# Patient Record
Sex: Female | Born: 1990
Health system: Southern US, Community
[De-identification: ages and names within clinical notes are randomized; demographics above are authoritative.]

## PROBLEM LIST (undated history)

## (undated) ENCOUNTER — Inpatient Hospital Stay (HOSPITAL_COMMUNITY): Payer: Medicaid Other

## (undated) DIAGNOSIS — F419 Anxiety disorder, unspecified: Secondary | ICD-10-CM

## (undated) DIAGNOSIS — N926 Irregular menstruation, unspecified: Secondary | ICD-10-CM

## (undated) DIAGNOSIS — Z309 Encounter for contraceptive management, unspecified: Secondary | ICD-10-CM

## (undated) DIAGNOSIS — F32A Depression, unspecified: Secondary | ICD-10-CM

## (undated) DIAGNOSIS — Z789 Other specified health status: Secondary | ICD-10-CM

## (undated) HISTORY — DX: Irregular menstruation, unspecified: N92.6

## (undated) HISTORY — PX: NO PAST SURGERIES: SHX2092

## (undated) HISTORY — DX: Encounter for contraceptive management, unspecified: Z30.9

---
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
## 2011-10-24 LAB — OB RESULTS CONSOLE HEPATITIS B SURFACE ANTIGEN: Hepatitis B Surface Ag: NEGATIVE

## 2011-10-24 LAB — OB RESULTS CONSOLE RPR: RPR: NONREACTIVE

## 2011-10-24 LAB — OB RESULTS CONSOLE GC/CHLAMYDIA: Chlamydia: NEGATIVE

## 2012-05-18 LAB — OB RESULTS CONSOLE GBS: GBS: NEGATIVE

## 2012-05-26 NOTE — L&D Delivery Note (Signed)
Operative Delivery Note At 7:10 PM a viable female was delivered via Vaginal, Vacuum Investment banker, operational).  Presentation: vertex; Position: Left,; Station: +2.  Verbal consent: obtained from patient.  Risks and benefits discussed in detail.  Risks include, but are not limited to the risks of anesthesia, bleeding, infection, damage to maternal tissues, fetal cephalhematoma.  There is also the risk of inability to effect vaginal delivery of the head, or shoulder dystocia that cannot be resolved by established maneuvers, leading to the need for emergency cesarean section.  APGAR: 6, 8; weight .   Placenta status: , .   Cord: 3 vessels with the following complications: .  Cord pH: 7.14  Anesthesia: Epidural  Instruments: Kiwi vacuum Episiotomy: None Lacerations: left labial and vaginal sulcus repaired.  Right periurethral hemostatic Suture Repair: 3.0 vicryl rapide Est. Blood Loss (mL): 400  Mom to postpartum.  Baby to nursery-stable.  Kimi Bordeau H. 06/12/2012, 7:47 PM

## 2012-06-06 ENCOUNTER — Inpatient Hospital Stay (HOSPITAL_COMMUNITY)
Admission: AD | Admit: 2012-06-06 | Discharge: 2012-06-07 | Disposition: A | Discharge status: Home / Self Care | Payer: Medicaid Other | Source: Ambulatory Visit | Attending: Obstetrics & Gynecology | Admitting: Obstetrics & Gynecology

## 2012-06-06 ENCOUNTER — Encounter (HOSPITAL_COMMUNITY): Payer: Self-pay | Admitting: *Deleted

## 2012-06-06 DIAGNOSIS — M549 Dorsalgia, unspecified: Secondary | ICD-10-CM | POA: Insufficient documentation

## 2012-06-06 DIAGNOSIS — N949 Unspecified condition associated with female genital organs and menstrual cycle: Secondary | ICD-10-CM | POA: Insufficient documentation

## 2012-06-06 DIAGNOSIS — O99891 Other specified diseases and conditions complicating pregnancy: Secondary | ICD-10-CM | POA: Insufficient documentation

## 2012-06-06 NOTE — MAU Note (Signed)
Pt reports having back pain and pelvic pain. Tightening. ?leaking fluid x 1 hour

## 2012-06-07 LAB — AMNISURE RUPTURE OF MEMBRANE (ROM) NOT AT ARMC: Amnisure ROM: NEGATIVE

## 2012-06-07 MED ORDER — ZOLPIDEM TARTRATE 5 MG PO TABS
5.0000 mg | ORAL_TABLET | Freq: Once | ORAL | Status: AC
  Administered 2012-06-07: 5 mg via ORAL
  Filled 2012-06-07: qty 1

## 2012-06-07 NOTE — MAU Note (Signed)
Pt's mom concerned about not getting back in time or not knowing when to return. Discussed s/s's of labor, things to return right away for. Average length of first labor, can call office in am if needed or return here as needed.

## 2012-06-11 ENCOUNTER — Inpatient Hospital Stay (HOSPITAL_COMMUNITY)
Admission: AD | Admit: 2012-06-11 | Discharge: 2012-06-11 | Disposition: A | Discharge status: Home / Self Care | Payer: Medicaid Other | Source: Ambulatory Visit | Attending: Obstetrics and Gynecology | Admitting: Obstetrics and Gynecology

## 2012-06-11 ENCOUNTER — Encounter (HOSPITAL_COMMUNITY): Payer: Self-pay | Admitting: *Deleted

## 2012-06-11 ENCOUNTER — Inpatient Hospital Stay (HOSPITAL_COMMUNITY)
Admission: AD | Admit: 2012-06-11 | Discharge: 2012-06-14 | DRG: 775 | Disposition: A | Discharge status: Home / Self Care | Payer: Medicaid Other | Source: Ambulatory Visit | Attending: Obstetrics & Gynecology | Admitting: Obstetrics & Gynecology

## 2012-06-11 DIAGNOSIS — R339 Retention of urine, unspecified: Secondary | ICD-10-CM | POA: Diagnosis not present

## 2012-06-11 DIAGNOSIS — O479 False labor, unspecified: Secondary | ICD-10-CM | POA: Insufficient documentation

## 2012-06-11 DIAGNOSIS — O368190 Decreased fetal movements, unspecified trimester, not applicable or unspecified: Secondary | ICD-10-CM | POA: Diagnosis present

## 2012-06-11 DIAGNOSIS — O480 Post-term pregnancy: Secondary | ICD-10-CM | POA: Diagnosis present

## 2012-06-11 MED ORDER — ZOLPIDEM TARTRATE 5 MG PO TABS
5.0000 mg | ORAL_TABLET | Freq: Once | ORAL | Status: AC
  Administered 2012-06-11: 5 mg via ORAL
  Filled 2012-06-11: qty 1

## 2012-06-11 NOTE — MAU Note (Signed)
Contractions, back pain

## 2012-06-11 NOTE — MAU Provider Note (Signed)
  History     CSN: [id number]  Arrival date and time: 06/11/12 2257   None     Chief Complaint  Patient presents with  . Labor Eval   HPI Comments: 22 y.o. G1 at 40.1 weeks followed at [facility] for her [facility] presents with contractions.  She was seen ~20 hours ago for the same.  At that time she was 3cm, she is currently 3.5cm.  She reports contractions every 1-2 minutes, have not slowed down since being sent home this AM.  Decreased FM for the past few hours. No vaginal bleeding or LOF.   Uncomplicated prenatal course, GBS negative    OB History    Grav Para Term Preterm Abortions TAB SAB Ect Mult Living   1               Past Medical History  Diagnosis Date  . No pertinent past medical history     Past Surgical History  Procedure Date  . No past surgeries     History reviewed. No pertinent family history.  History  Substance Use Topics  . Smoking status: Current Every Day Smoker -- 0.5 packs/day  . Smokeless tobacco: Never Used  . Alcohol Use: No    Allergies: No Known Allergies  Prescriptions prior to admission  Medication Sig Dispense Refill  . prenatal vitamin w/FE, FA (PRENATAL 1 + 1) 27-1 MG TABS Take 1 tablet by mouth daily.        ROS Physical Exam   Blood pressure 120/82, pulse 102, temperature 98.4 F (36.9 C), temperature source Oral, resp. rate 20, height 5' (1.524 m), weight 76.295 kg (168 lb 3.2 oz).  Physical Exam  Constitutional: She appears well-developed and well-nourished. No distress.  GI:       gravid   Dilation: 3.5 Effacement (%): 70 Cervical Position: Middle Station: -2 Presentation: Vertex Exam by:: L.. Munford RN   MAU Course  Procedures  MDM Minimal cervical change but in a post-dates pt. D/w Dr. Emelda Fear who is in favor of admitting patient to L&D  Assessment and Plan  22 yo G1 in early/prodromal labor -Admit to L&D  [facility] 06/11/2012, 11:27 PM

## 2012-06-11 NOTE — MAU Note (Signed)
Contractions about a minute apart. No vaginal bleeding or leaking of fluid.

## 2012-06-11 NOTE — H&P (Signed)
Gina Alvarez is a G16P0 22 y.o. female @ 26.1 by 1st trimester U/S followed at [facility] presenting for contractions.  Was evaluated early in the morning yesterday for contractions but was sent home with no cervical change.  Contractions have not improved since doing home, every 1-2 minutes. Decreased FM this evening. No LOF or vaginal bleeding.  Maternal Medical History:  Reason for admission: Reason for admission: contractions.  Reason for Admission:   nauseaContractions: Onset was yesterday.   Frequency: regular.   Perceived severity is moderate.    Fetal activity: Perceived fetal activity is decreased.   Last perceived fetal movement was within the past hour.    Prenatal complications: No bleeding, hypertension, infection, pre-eclampsia, preterm labor or substance abuse.   Prenatal Complications - Diabetes: none.    OB History    Grav Para Term Preterm Abortions TAB SAB Ect Mult Living   1              Past Medical History  Diagnosis Date  . No pertinent past medical history    Past Surgical History  Procedure Date  . No past surgeries    Family History: family history is not on file. Social History:  reports that she has been smoking.  She has never used smokeless tobacco. She reports that she does not drink alcohol or use illicit drugs.   Prenatal Transfer Tool  Maternal Diabetes: No Genetic Screening: Normal Maternal Ultrasounds/Referrals: Normal Fetal Ultrasounds or other Referrals:  None Maternal Substance Abuse:  No Significant Maternal Medications:  None Significant Maternal Lab Results:  Lab values include: Group B Strep negative Other Comments:  None  Review of Systems  Constitutional: Negative for fever.  Respiratory: Negative for cough.   Gastrointestinal: Positive for abdominal pain. Negative for nausea and vomiting.  Genitourinary: Negative for dysuria.  Musculoskeletal: Negative for falls.  Neurological: Negative for dizziness.    Dilation:  3.5 Effacement (%): 70 Station: -2 Exam by:: L.. Munford RN Blood pressure 120/82, pulse 102, temperature 98.4 F (36.9 C), temperature source Oral, resp. rate 20, height 5' (1.524 m), weight 76.295 kg (168 lb 3.2 oz). Maternal Exam:  Uterine Assessment: Contraction strength is firm.  Contraction frequency is irregular.   Abdomen: Patient reports no abdominal tenderness. Cervix: Cervix evaluated by digital exam.     Fetal Exam Fetal Monitor Review: Baseline rate: 130.  Variability: moderate (6-25 bpm).   Pattern: accelerations present.    Fetal State Assessment: Category I - tracings are normal.     Physical Exam  Constitutional: She is oriented to person, place, and time. She appears well-developed and well-nourished. No distress.  HENT:  Head: Normocephalic and atraumatic.  Eyes: Conjunctivae normal are normal. Pupils are equal, round, and reactive to light.  Cardiovascular: Normal rate and regular rhythm.   No murmur heard. Respiratory: Effort normal and breath sounds normal. No respiratory distress. She has no wheezes.  GI:       gravid  Musculoskeletal: She exhibits no edema.  Neurological: She is alert and oriented to person, place, and time.  Skin: Skin is warm and dry. No rash noted.    Prenatal labs: ABO, Rh:  A+ Antibody:   Neg Rubella:   Imm RPR:   NR HBsAg:   Neg HIV:   NR GBS: Negative (12/24 0000)   Assessment/Plan: 22 yo G1P0 at 40.1 presents in early/prodromal labor -Admit to L&D -GBS neg, no need for abx at this time -Cat 1 tracing per Dr. Emelda Fear  -  Fentanyl and then epidural for pain -Will monitor for a few hours and start pitocin if needed   Gina Alvarez 06/11/2012, 11:54 PM

## 2012-06-12 ENCOUNTER — Encounter (HOSPITAL_COMMUNITY): Payer: Self-pay | Admitting: Anesthesiology

## 2012-06-12 ENCOUNTER — Inpatient Hospital Stay (HOSPITAL_COMMUNITY): Payer: Medicaid Other | Admitting: Anesthesiology

## 2012-06-12 ENCOUNTER — Encounter (HOSPITAL_COMMUNITY): Payer: Self-pay | Admitting: *Deleted

## 2012-06-12 DIAGNOSIS — O480 Post-term pregnancy: Secondary | ICD-10-CM

## 2012-06-12 DIAGNOSIS — O368190 Decreased fetal movements, unspecified trimester, not applicable or unspecified: Secondary | ICD-10-CM

## 2012-06-12 LAB — CBC
HCT: 34.5 % — ABNORMAL LOW (ref 36.0–46.0)
MCH: 32.8 pg (ref 26.0–34.0)
MCV: 92.7 fL (ref 78.0–100.0)
RBC: 3.72 MIL/uL — ABNORMAL LOW (ref 3.87–5.11)
WBC: 19.4 10*3/uL — ABNORMAL HIGH (ref 4.0–10.5)

## 2012-06-12 MED ORDER — PRENATAL MULTIVITAMIN CH
1.0000 | ORAL_TABLET | Freq: Every day | ORAL | Status: DC
  Administered 2012-06-13 – 2012-06-14 (×2): 1 via ORAL
  Filled 2012-06-12 (×2): qty 1

## 2012-06-12 MED ORDER — FENTANYL 2.5 MCG/ML BUPIVACAINE 1/10 % EPIDURAL INFUSION (WH - ANES)
14.0000 mL/h | INTRAMUSCULAR | Status: DC
  Filled 2012-06-12 (×2): qty 125

## 2012-06-12 MED ORDER — LACTATED RINGERS IV SOLN
INTRAVENOUS | Status: DC
  Administered 2012-06-12 (×2): via INTRAVENOUS

## 2012-06-12 MED ORDER — TETANUS-DIPHTH-ACELL PERTUSSIS 5-2.5-18.5 LF-MCG/0.5 IM SUSP: 0.5000 mL | Freq: Once | INTRAMUSCULAR | Status: DC

## 2012-06-12 MED ORDER — CITRIC ACID-SODIUM CITRATE 334-500 MG/5ML PO SOLN
ORAL | Status: AC
  Filled 2012-06-12: qty 15

## 2012-06-12 MED ORDER — PHENYLEPHRINE 40 MCG/ML (10ML) SYRINGE FOR IV PUSH (FOR BLOOD PRESSURE SUPPORT)
80.0000 ug | PREFILLED_SYRINGE | INTRAVENOUS | Status: DC | PRN
  Filled 2012-06-12: qty 5

## 2012-06-12 MED ORDER — SENNOSIDES-DOCUSATE SODIUM 8.6-50 MG PO TABS
2.0000 | ORAL_TABLET | Freq: Every day | ORAL | Status: DC
  Administered 2012-06-12 – 2012-06-13 (×2): 2 via ORAL

## 2012-06-12 MED ORDER — WITCH HAZEL-GLYCERIN EX PADS: 1.0000 "application " | MEDICATED_PAD | CUTANEOUS | Status: DC | PRN

## 2012-06-12 MED ORDER — IBUPROFEN 600 MG PO TABS: 600.0000 mg | ORAL_TABLET | Freq: Four times a day (QID) | ORAL | Status: DC | PRN

## 2012-06-12 MED ORDER — LACTATED RINGERS IV SOLN: 500.0000 mL | INTRAVENOUS | Status: DC | PRN

## 2012-06-12 MED ORDER — OXYTOCIN BOLUS FROM INFUSION
500.0000 mL | INTRAVENOUS | Status: DC
  Administered 2012-06-12: 500 mL via INTRAVENOUS

## 2012-06-12 MED ORDER — BISACODYL 10 MG RE SUPP: 10.0000 mg | Freq: Every day | RECTAL | Status: DC | PRN

## 2012-06-12 MED ORDER — LACTATED RINGERS IV SOLN
500.0000 mL | Freq: Once | INTRAVENOUS | Status: AC
  Administered 2012-06-12: 500 mL via INTRAVENOUS

## 2012-06-12 MED ORDER — CITRIC ACID-SODIUM CITRATE 334-500 MG/5ML PO SOLN: 30.0000 mL | ORAL | Status: DC | PRN

## 2012-06-12 MED ORDER — ZOLPIDEM TARTRATE 5 MG PO TABS: 5.0000 mg | ORAL_TABLET | Freq: Every evening | ORAL | Status: DC | PRN

## 2012-06-12 MED ORDER — ACETAMINOPHEN 325 MG PO TABS
650.0000 mg | ORAL_TABLET | ORAL | Status: DC | PRN
  Administered 2012-06-12: 650 mg via ORAL
  Filled 2012-06-12: qty 2

## 2012-06-12 MED ORDER — PHENYLEPHRINE 40 MCG/ML (10ML) SYRINGE FOR IV PUSH (FOR BLOOD PRESSURE SUPPORT): 80.0000 ug | PREFILLED_SYRINGE | INTRAVENOUS | Status: DC | PRN

## 2012-06-12 MED ORDER — EPHEDRINE 5 MG/ML INJ
10.0000 mg | INTRAVENOUS | Status: DC | PRN
  Filled 2012-06-12: qty 4

## 2012-06-12 MED ORDER — ONDANSETRON HCL 4 MG/2ML IJ SOLN: 4.0000 mg | INTRAMUSCULAR | Status: DC | PRN

## 2012-06-12 MED ORDER — MEASLES, MUMPS & RUBELLA VAC ~~LOC~~ INJ
0.5000 mL | INJECTION | Freq: Once | SUBCUTANEOUS | Status: DC
  Filled 2012-06-12: qty 0.5

## 2012-06-12 MED ORDER — OXYTOCIN 40 UNITS IN LACTATED RINGERS INFUSION - SIMPLE MED: 62.5000 mL/h | INTRAVENOUS | Status: DC

## 2012-06-12 MED ORDER — OXYTOCIN 40 UNITS IN LACTATED RINGERS INFUSION - SIMPLE MED: 62.5000 mL/h | INTRAVENOUS | Status: DC | PRN

## 2012-06-12 MED ORDER — LIDOCAINE HCL (PF) 1 % IJ SOLN: 30.0000 mL | INTRAMUSCULAR | Status: DC | PRN

## 2012-06-12 MED ORDER — ONDANSETRON HCL 4 MG/2ML IJ SOLN: 4.0000 mg | Freq: Four times a day (QID) | INTRAMUSCULAR | Status: DC | PRN

## 2012-06-12 MED ORDER — FENTANYL 2.5 MCG/ML BUPIVACAINE 1/10 % EPIDURAL INFUSION (WH - ANES)
INTRAMUSCULAR | Status: DC | PRN
  Administered 2012-06-12: 14 mL/h via EPIDURAL

## 2012-06-12 MED ORDER — SIMETHICONE 80 MG PO CHEW: 80.0000 mg | CHEWABLE_TABLET | ORAL | Status: DC | PRN

## 2012-06-12 MED ORDER — SODIUM CHLORIDE 0.9 % IJ SOLN: 3.0000 mL | Freq: Two times a day (BID) | INTRAMUSCULAR | Status: DC

## 2012-06-12 MED ORDER — ONDANSETRON HCL 4 MG PO TABS: 4.0000 mg | ORAL_TABLET | ORAL | Status: DC | PRN

## 2012-06-12 MED ORDER — SODIUM BICARBONATE 8.4 % IV SOLN
INTRAVENOUS | Status: DC | PRN
  Administered 2012-06-12: 5 mL via EPIDURAL

## 2012-06-12 MED ORDER — IBUPROFEN 600 MG PO TABS
600.0000 mg | ORAL_TABLET | Freq: Four times a day (QID) | ORAL | Status: DC
  Administered 2012-06-12 – 2012-06-14 (×7): 600 mg via ORAL
  Filled 2012-06-12 (×7): qty 1

## 2012-06-12 MED ORDER — DIBUCAINE 1 % RE OINT: 1.0000 "application " | TOPICAL_OINTMENT | RECTAL | Status: DC | PRN

## 2012-06-12 MED ORDER — BENZOCAINE-MENTHOL 20-0.5 % EX AERO
1.0000 "application " | INHALATION_SPRAY | CUTANEOUS | Status: DC | PRN
  Administered 2012-06-12: 1 via TOPICAL
  Filled 2012-06-12: qty 56

## 2012-06-12 MED ORDER — FENTANYL CITRATE 0.05 MG/ML IJ SOLN
50.0000 ug | INTRAMUSCULAR | Status: DC | PRN
  Administered 2012-06-12 (×2): 50 ug via INTRAVENOUS
  Filled 2012-06-12 (×2): qty 2

## 2012-06-12 MED ORDER — OXYCODONE-ACETAMINOPHEN 5-325 MG PO TABS
1.0000 | ORAL_TABLET | ORAL | Status: DC | PRN
  Administered 2012-06-13 – 2012-06-14 (×4): 1 via ORAL
  Filled 2012-06-12 (×4): qty 1

## 2012-06-12 MED ORDER — FLEET ENEMA 7-19 GM/118ML RE ENEM: 1.0000 | ENEMA | Freq: Every day | RECTAL | Status: DC | PRN

## 2012-06-12 MED ORDER — SODIUM CHLORIDE 0.9 % IV SOLN: 250.0000 mL | INTRAVENOUS | Status: DC | PRN

## 2012-06-12 MED ORDER — DIPHENHYDRAMINE HCL 25 MG PO CAPS: 25.0000 mg | ORAL_CAPSULE | Freq: Four times a day (QID) | ORAL | Status: DC | PRN

## 2012-06-12 MED ORDER — TERBUTALINE SULFATE 1 MG/ML IJ SOLN: 0.2500 mg | Freq: Once | INTRAMUSCULAR | Status: DC | PRN

## 2012-06-12 MED ORDER — OXYTOCIN 40 UNITS IN LACTATED RINGERS INFUSION - SIMPLE MED
1.0000 m[IU]/min | INTRAVENOUS | Status: DC
  Administered 2012-06-12: 2 m[IU]/min via INTRAVENOUS
  Filled 2012-06-12: qty 1000

## 2012-06-12 MED ORDER — LANOLIN HYDROUS EX OINT: TOPICAL_OINTMENT | CUTANEOUS | Status: DC | PRN

## 2012-06-12 MED ORDER — DIPHENHYDRAMINE HCL 50 MG/ML IJ SOLN: 12.5000 mg | INTRAMUSCULAR | Status: DC | PRN

## 2012-06-12 MED ORDER — EPHEDRINE 5 MG/ML INJ: 10.0000 mg | INTRAVENOUS | Status: DC | PRN

## 2012-06-12 MED ORDER — FLEET ENEMA 7-19 GM/118ML RE ENEM: 1.0000 | ENEMA | RECTAL | Status: DC | PRN

## 2012-06-12 MED ORDER — SODIUM CHLORIDE 0.9 % IJ SOLN: 3.0000 mL | INTRAMUSCULAR | Status: DC | PRN

## 2012-06-12 MED ORDER — OXYCODONE-ACETAMINOPHEN 5-325 MG PO TABS: 1.0000 | ORAL_TABLET | ORAL | Status: DC | PRN

## 2012-06-12 NOTE — Progress Notes (Signed)
Patient requesting additional pain med,  Says she "can feel contractions now and she can't sleep". Patient showing any sign of pain during contractio, able to talk and walk during contraction without any sign of distress. During palpation for 10 minutes felt 2 contractions, able to easily indent uterus during contractions. Encouraged patient to wait for medication and cannot be repeated indefinitely and some pain during labor is to be expected. Patient agreed to wait until exam at 4AM and will revisit pain medication at that time. Candise Che, RN

## 2012-06-12 NOTE — Progress Notes (Signed)
Gina Alvarez is a 22 y.o. G1P0 at [redacted]w[redacted]d admitted for SOL early phase  Subjective: Able to tell when having uc to push Family supportive at bs  Objective: BP 90/55  Pulse 85  Temp 99.5 F (37.5 C) (Axillary)  Resp 18  Ht 5' (1.524 m)  Wt 76.295 kg (168 lb 3.2 oz)  BMI 32.85 kg/m2  SpO2 100%      FHT:  Baseline indeterminate, 145-165 between uc's/pushing, mostly minimal variability w/  Brief periods of moderate, no accels, variables & lates w/ pushing Dr. leggett recently in to view progress and fhr UC:   1.5-4 SVE:   Dilation: 10 Effacement (%): 100 Station: +2 Exam by:: lee  Pushing in lithotomy x 1hr w/ some descent of head per rn Maternal position change to Lt lateral for pushing  O2 via nrbm  Labs: Lab Results  Component Value Date   WBC 19.4* 06/12/2012   HGB 12.2 06/12/2012   HCT 34.5* 06/12/2012   MCV 92.7 06/12/2012   PLT 163 06/12/2012    Assessment / Plan: Augmentation of labor, progressing well  Labor: Progressing normally Preeclampsia:  n/a Fetal Wellbeing:  Category II Pain Control:  Epidural I/D:  n/a Anticipated MOD:  NSVD  Marge Duncans 06/12/2012, 6:32 PM

## 2012-06-12 NOTE — Progress Notes (Signed)
Gina Alvarez is a 22 y.o. G1P0 at [redacted]w[redacted]d admitted for SOL- early phase  Subjective: Comfortable w/ epidural, feeling a little pelvic pressure. Family supportive at bs.  Objective: BP 97/53  Pulse 74  Temp 98.5 F (36.9 C) (Axillary)  Resp 16  Ht 5' (1.524 m)  Wt 76.295 kg (168 lb 3.2 oz)  BMI 32.85 kg/m2  SpO2 100%      FHT:  FHR: 130 bpm, variability: moderate,  accelerations:  Present,  decelerations:  Present variable and late when on back for exam  Turned to Rt exaggerated sims and lates continued, LR bolus and O2 via nrbm given and turned to Lt exaggerated sims with resolution of decels UC:   irregular, every 1-4 minutes SVE:   6/90/-1, AROM small amount blood-tinged fluid  Moderate amount thick dark-red blood w/ small clots on exam and gown/pad   Labs: Lab Results  Component Value Date   WBC 19.4* 06/12/2012   HGB 12.2 06/12/2012   HCT 34.5* 06/12/2012   MCV 92.7 06/12/2012   PLT 163 06/12/2012    Assessment / Plan: Augmentation of labor, protracted active phase, pitocin @ 64mu/min, presumed partial marginal abruption  Labor: protracted active phase Preeclampsia:  n/a Fetal Wellbeing:  Category II Pain Control:  Epidural I/D:  n/a Anticipated MOD:  NSVD  Marge Duncans 06/12/2012, 1:29 PM

## 2012-06-12 NOTE — Progress Notes (Signed)
Gina Alvarez is a 22 y.o. G1P0 at [redacted]w[redacted]d admitted for early labor, postdates  Subjective: Thinks she wants an epidural, having a lot of back pain/labor  Objective: BP 108/63  Pulse 78  Temp 98.7 F (37.1 C) (Axillary)  Resp 18  Ht 5' (1.524 m)  Wt 76.295 kg (168 lb 3.2 oz)  BMI 32.85 kg/m2      FHT:  FHR: 130 bpm, variability: moderate,  accelerations:  Present,  decelerations:  Absent UC:   regular, every 2-3 minutes SVE:   Dilation: 5 Effacement (%): 100 Station: -1 Exam by:: lee  Labs: Lab Results  Component Value Date   WBC 19.4* 06/12/2012   HGB 12.2 06/12/2012   HCT 34.5* 06/12/2012   MCV 92.7 06/12/2012   PLT 163 06/12/2012    Assessment / Plan: Early labor, on pitocin  Labor: Progressing on Pitocin, will continue to increase then AROM Preeclampsia:  n/a Fetal Wellbeing:  Category I Pain Control:  Fentanyl and then epidural I/D:  n/a Anticipated MOD:  NSVD  Ayra Hodgdon 06/12/2012, 7:33 AM

## 2012-06-12 NOTE — Progress Notes (Addendum)
Gina Alvarez is a 22 y.o. G1P0 at [redacted]w[redacted]d admitted for SOL, early phase  Subjective: Mostly comfortable w/ epidural, feeling more pelvic pressure  Objective: BP 106/57  Pulse 87  Temp 98.4 F (36.9 C) (Axillary)  Resp 16  Ht 5' (1.524 m)  Wt 76.295 kg (168 lb 3.2 oz)  BMI 32.85 kg/m2  SpO2 100%      FHT:  FHR: 140 bpm, variability: moderate,  accelerations:  Present,  decelerations:  Present rare late, some earlies UC:   q 1-3.5 SVE:   9/100/+1 w/ pos scalp stim IUPC not placed d/t advanced dilation  Labs: Lab Results  Component Value Date   WBC 19.4* 06/12/2012   HGB 12.2 06/12/2012   HCT 34.5* 06/12/2012   MCV 92.7 06/12/2012   PLT 163 06/12/2012    Assessment / Plan: Augmentation of labor, progressing well, pitocin currently @ 6mu/min  Labor: Progressing normally Preeclampsia:  n/a Fetal Wellbeing:  Category II Pain Control:  Epidural I/D:  n/a Anticipated MOD:  NSVD  Discussed w/ Dr. Penne Lash prior to exam Gina Alvarez 06/12/2012, 3:39 PM

## 2012-06-12 NOTE — MAU Provider Note (Signed)
Attestation of Attending Supervision of Advanced Practitioner: Evaluation and management procedures were performed by the PA/NP/CNM/OB Fellow under my supervision/collaboration. Chart reviewed and agree with management and plan.  Jacquelynne Guedes V 06/12/2012 2:10 AM

## 2012-06-12 NOTE — Progress Notes (Signed)
Gina Alvarez is a 22 y.o. G1P0 at [redacted]w[redacted]d admitted for SOL, early phase  Subjective: Comfortable w/ epidural, no complaints.   Objective: BP 113/69  Pulse 87  Temp 98 F (36.7 C) (Axillary)  Resp 16  Ht 5' (1.524 m)  Wt 76.295 kg (168 lb 3.2 oz)  BMI 32.85 kg/m2  SpO2 100%      FHT:  FHR: 130 bpm, variability: min-mod,  accelerations:  Present,  decelerations:  Present late x 1 Maternal position change to Lt exaggerated sims from Rt, with improvement in variability, and no further lates UC:   regular, every 1-3 minutes SVE:   Dilation: 6 Effacement (%): 100 Station: 0 Exam by:: lee @ 1011 Slightly increased amount of bloody show  Labs: Lab Results  Component Value Date   WBC 19.4* 06/12/2012   HGB 12.2 06/12/2012   HCT 34.5* 06/12/2012   MCV 92.7 06/12/2012   PLT 163 06/12/2012    Assessment / Plan: Augmentation of labor, progressing well, pitocin currently @ 37mu/min  Labor: Progressing normally Preeclampsia:  n/a Fetal Wellbeing:  Category II Pain Control:  Epidural I/D:  n/a Anticipated MOD:  NSVD  Marge Duncans 06/12/2012, 11:30 AM

## 2012-06-12 NOTE — H&P (Signed)
Attestation of Attending Supervision of Advanced Practitioner: Evaluation and management procedures were performed by the PA/NP/CNM/OB Fellow under my supervision/collaboration. Chart reviewed and agree with management and plan.  Zaidee Rion V 06/12/2012 2:10 AM    

## 2012-06-12 NOTE — Anesthesia Procedure Notes (Signed)

## 2012-06-12 NOTE — Anesthesia Preprocedure Evaluation (Signed)

## 2012-06-13 NOTE — Addendum Note (Signed)
Addendum  created 06/13/12 0749 by Lincoln Brigham, CRNA   Modules edited:Charges VN, Notes Section

## 2012-06-13 NOTE — Addendum Note (Signed)
Addendum  created 06/13/12 0746 by Jiles Garter, MD   Modules edited:Notes Section

## 2012-06-13 NOTE — Anesthesia Postprocedure Evaluation (Signed)
  Anesthesia Post-op Note This patient has recovered from her labor epidural, and I am not aware of any complications or problems.  

## 2012-06-13 NOTE — Anesthesia Postprocedure Evaluation (Signed)
Anesthesia Post Note  Patient: Gina Alvarez  Procedure(s) Performed: * No procedures listed *  Anesthesia type: Epidural  Patient location: Mother/Baby  Post pain: Pain level controlled  Post assessment: Post-op Vital signs reviewed  Last Vitals:  Filed Vitals:   06/13/12 0526  BP: 104/66  Pulse: 88  Temp: 36.7 C  Resp: 20    Post vital signs: Reviewed  Level of consciousness:alert  Complications: No apparent anesthesia complications

## 2012-06-13 NOTE — Progress Notes (Signed)
Post Partum Day 1 Subjective: no complaints, up ad lib, tolerating PO and + flatus Objective: Blood pressure 104/66, pulse 88, temperature 98.1 F (36.7 C), temperature source Oral, resp. rate 20, height 5' (1.524 m), weight 76.295 kg (168 lb 3.2 oz), SpO2 100.00%, unknown if currently breastfeeding. Unable to urinate postpartum, RN did I&O cath and got , so placed foley  Physical Exam:  General: alert, cooperative and no distress Lochia: appropriate Uterine Fundus: firm Incision: n/a DVT Evaluation: No evidence of DVT seen on physical exam. Negative Homan's sign. No cords or calf tenderness. No significant calf/ankle edema.   Basename 06/12/12 0055  HGB 12.2  HCT 34.5*    Assessment/Plan: Plan for discharge tomorrow, Breastfeeding, Lactation consult and Contraception nexplanon   LOS: 2 days   Marge Duncans 06/13/2012, 7:22 AM

## 2012-06-14 DIAGNOSIS — R339 Retention of urine, unspecified: Secondary | ICD-10-CM

## 2012-06-14 HISTORY — DX: Retention of urine, unspecified: R33.9

## 2012-06-14 MED ORDER — OXYCODONE-ACETAMINOPHEN 5-325 MG PO TABS: 1.0000 | ORAL_TABLET | ORAL | Status: DC | PRN

## 2012-06-14 MED ORDER — IBUPROFEN 600 MG PO TABS: 600.0000 mg | ORAL_TABLET | Freq: Four times a day (QID) | ORAL | Status: DC

## 2012-06-14 NOTE — Discharge Summary (Signed)
Attestation of Attending Supervision of Advanced Practitioner: Evaluation and management procedures were performed by the PA/NP/CNM/OB Fellow under my supervision/collaboration. Chart reviewed and agree with management and plan., and documentation of care.  Evalette Montrose V 06/14/2012 10:43 PM

## 2012-06-14 NOTE — Progress Notes (Signed)
Foley catheter was removed at 0715.  Pt voided 75ml at 0845 and at 1100 with an unmeasured void immediately following that void.  Spoke to Dr. Paulina Fusi who said to do a bladder scan post void, if bladder measured < pt could be D/C'd.  Pt then voided and had a post void bladder scan.  Informed pt to call physician if she is unable to void in 6 hours while at home, pt acknowledged instructions.

## 2012-06-14 NOTE — Progress Notes (Signed)
UR chart review completed.  

## 2012-06-14 NOTE — Discharge Summary (Signed)
Obstetric Discharge Summary Reason for Admission: onset of labor Prenatal Procedures: NST Intrapartum Procedures: vacuum Postpartum Procedures: none Complications-Operative and Postpartum: second degree perineal laceration and sulcus laceration.  Had perineal edema and was unable to void. Foley placed. It was removed this morning. . Hemoglobin  Date Value Range Status  06/12/2012 12.2  12.0 - 15.0 g/dL Final     HCT  Date Value Range Status  06/12/2012 34.5* 36.0 - 46.0 % Final   Hospital Course: G8P0 22 y.o. female @ 62.1 by 1st trimester U/S followed at [facility] presenting for contractions. Was evaluated early in the morning yesterday for contractions but was sent home with no cervical change. Contractions have not improved since doing home, every 1-2 minutes. Decreased FM this evening. No LOF or vaginal bleeding.   Labor progressed well but patient pushed for an hour or so with descent to +2 station.  Operative Delivery Note (per Dr Penne Lash, amended) At 7:10 PM a viable female was delivered via Vaginal, Vacuum (Extractor). Presentation: vertex; Position: Left,; Station: +2.   APGAR: 6, 8; weight .  Placenta status: , .  Cord: 3 vessels with the following complications: . Cord pH: 7.14  Anesthesia: Epidural  Instruments: Kiwi vacuum  Episiotomy: None  Lacerations: left labial and vaginal sulcus repaired. Right periurethral hemostatic  Suture Repair: 3.0 vicryl rapide  Est. Blood Loss (mL): 400  Mom to postpartum. Baby to nursery-stable.   Physical Exam:  General: alert and no distress Lochia: appropriate Uterine Fundus: firm Incision: healing well, Had increased edema yesterday and was unable to void, so Foley placed               Will ensure that she is able to void independently before discharge home. DVT Evaluation: No evidence of DVT seen on physical exam.  Discharge Diagnoses: Term Pregnancy-delivered  Discharge Information: Date: 06/14/2012 Activity:  unrestricted and pelvic rest Diet: routine Medications: Ibuprofen and Percocet Condition: stable Instructions: refer to practice specific booklet Discharge to: home   Newborn Data: Live born female  Birth Weight: 7 lb 0.4 oz (3185 g) APGAR: 6, 8  Home with mother.  [facility] 06/14/2012, 7:32 AM

## 2012-08-19 ENCOUNTER — Ambulatory Visit (INDEPENDENT_AMBULATORY_CARE_PROVIDER_SITE_OTHER): Payer: Medicaid Other | Admitting: Adult Health

## 2012-08-19 ENCOUNTER — Encounter: Payer: Self-pay | Admitting: Adult Health

## 2012-08-19 VITALS — BP 112/60 | Ht 59.0 in | Wt 144.0 lb

## 2012-08-19 DIAGNOSIS — IMO0001 Reserved for inherently not codable concepts without codable children: Secondary | ICD-10-CM

## 2012-08-19 DIAGNOSIS — Z3202 Encounter for pregnancy test, result negative: Secondary | ICD-10-CM

## 2012-08-19 DIAGNOSIS — Z30017 Encounter for initial prescription of implantable subdermal contraceptive: Secondary | ICD-10-CM

## 2012-08-19 DIAGNOSIS — Z309 Encounter for contraceptive management, unspecified: Secondary | ICD-10-CM

## 2012-08-19 HISTORY — DX: Encounter for contraceptive management, unspecified: Z30.9

## 2012-08-19 NOTE — Patient Instructions (Addendum)
Use condoms x 2 weeks, keep clean and dry x 24 hours, no heavy lifting, keep steri strips on x 72 hours, Keep pressure dressing on x 24 hours. Follow up prn problems.  

## 2012-08-19 NOTE — Progress Notes (Signed)
Subjective:     Patient ID: Gina Alvarez, female   DOB: 1991-01-25, 22 y.o.   MRN: [id number]  HPI Gina Alvarez is a 22 year old white female in today for a nexplanon. She delivered  Her baby girl 06/12/12.She had bleeding for 3 weeks,no menses yet, had sex with condom. Had negative pregnancy test today.   Review of Systems Gina Alvarez has no complaints today, except a ras on both arms that came up when pregnant, lightly red not raised, Dr. Emelda Fear in to see.     Objective:   Physical Exam Vital signs: Blood pressure 112/60. Weight:144lbs, height:4\' 11" , Pregnancy test negative. Left arm cleansed with betadine, and injected with 1.5 cc 2% lidocaine and waited til numb. Nexplanon easily inserted and steri strips applied. Pressure dressing applied.     Assessment:      Contraception management, Nexplanon insertion.    Plan:      Use condoms x 2 weeks, keep clean and dry x 24 hours, no heavy lifting, keep steri strips on x 72 hours, Keep pressure dressing on x 24 hours. Follow up prn problems.

## 2013-09-15 ENCOUNTER — Ambulatory Visit: Payer: Medicaid Other | Admitting: Adult Health

## 2013-09-26 ENCOUNTER — Encounter: Payer: Self-pay | Admitting: Adult Health

## 2013-09-26 ENCOUNTER — Ambulatory Visit (INDEPENDENT_AMBULATORY_CARE_PROVIDER_SITE_OTHER): Payer: Medicaid Other | Admitting: Adult Health

## 2013-09-26 VITALS — BP 110/70 | Ht 60.0 in | Wt 120.0 lb

## 2013-09-26 DIAGNOSIS — Z3049 Encounter for surveillance of other contraceptives: Secondary | ICD-10-CM

## 2013-09-26 DIAGNOSIS — N926 Irregular menstruation, unspecified: Secondary | ICD-10-CM

## 2013-09-26 DIAGNOSIS — Z975 Presence of (intrauterine) contraceptive device: Secondary | ICD-10-CM | POA: Insufficient documentation

## 2013-09-26 HISTORY — DX: Irregular menstruation, unspecified: N92.6

## 2013-09-26 NOTE — Progress Notes (Signed)
Subjective:     Patient ID: Gina Alvarez, female   DOB: Dec 01, 1990, 23 y.o.   MRN: [id number]  HPI Gina Alvarez is a 23 year old white female in for irregular bleeding with nexplanon that started about 2 months ago.  Review of Systems See HPI Reviewed past medical,surgical, social and family history. Reviewed medications and allergies.     Objective:   Physical Exam BP 110/70  Ht 5' (1.524 m)  Wt 120 lb (54.432 kg)  BMI 23.44 kg/m2  LMP 09/26/2013  Breastfeeding? No   Talk only, has had nexplanon since 05/2011,has been fine til about 2 months age and started having irregular bleeding,also has new sex partner of about 2 months.Saw Dr Gina Alvarez had negative UPT and labs then.Discussed could be STD will check GC/CHL first.If they are negative will try megace to stop bleeding.  Assessment:     Nexplanon in place    Irregular bleeding Plan:     Check GC/CHL Will talk in am, and treat accordingly Return in 4 weeks for Pap and physical   Review handout on DUB

## 2013-09-26 NOTE — Patient Instructions (Signed)

## 2013-09-27 ENCOUNTER — Telehealth: Payer: Self-pay | Admitting: Adult Health

## 2013-09-27 LAB — GC/CHLAMYDIA PROBE AMP
CT PROBE, AMP APTIMA: NEGATIVE
GC PROBE AMP APTIMA: NEGATIVE

## 2013-09-27 NOTE — Telephone Encounter (Signed)
Spoke with pt letting her know GC/CHL was negative. JSY 

## 2013-09-28 ENCOUNTER — Telehealth: Payer: Self-pay | Admitting: Adult Health

## 2013-09-28 NOTE — Telephone Encounter (Signed)
Says number incorrect

## 2013-10-20 ENCOUNTER — Telehealth: Payer: Self-pay | Admitting: Adult Health

## 2013-10-20 MED ORDER — MEGESTROL ACETATE 40 MG PO TABS: ORAL_TABLET | ORAL | Status: DC

## 2013-10-20 NOTE — Telephone Encounter (Signed)
Will rx megace 

## 2013-11-03 ENCOUNTER — Other Ambulatory Visit: Payer: Medicaid Other | Admitting: Adult Health

## 2013-11-03 ENCOUNTER — Encounter: Payer: Self-pay | Admitting: *Deleted

## 2014-01-31 ENCOUNTER — Other Ambulatory Visit: Payer: Self-pay | Admitting: Adult Health

## 2014-03-27 ENCOUNTER — Encounter: Payer: Self-pay | Admitting: Adult Health

## 2015-12-04 DIAGNOSIS — Z8481 Family history of carrier of genetic disease: Secondary | ICD-10-CM | POA: Diagnosis not present

## 2015-12-04 DIAGNOSIS — Z803 Family history of malignant neoplasm of breast: Secondary | ICD-10-CM | POA: Diagnosis not present

## 2015-12-17 ENCOUNTER — Encounter: Payer: Self-pay | Admitting: Genetic Counselor

## 2015-12-17 ENCOUNTER — Telehealth: Payer: Self-pay | Admitting: Genetic Counselor

## 2015-12-17 NOTE — Telephone Encounter (Signed)
Appointment with Jeanine Luz on 8/24 at 1pm. Patient aware to arrive 30 minutes early. Location given. Telephone call was made to [facility] at the referring office, regarding appointment date and time. Letter mailed to the patient.

## 2015-12-26 DIAGNOSIS — J3089 Other allergic rhinitis: Secondary | ICD-10-CM | POA: Diagnosis not present

## 2016-01-09 ENCOUNTER — Encounter: Payer: Self-pay | Admitting: Advanced Practice Midwife

## 2016-01-09 ENCOUNTER — Ambulatory Visit (INDEPENDENT_AMBULATORY_CARE_PROVIDER_SITE_OTHER): Payer: 59 | Admitting: Advanced Practice Midwife

## 2016-01-09 VITALS — BP 104/70 | HR 72 | Ht 60.0 in | Wt 145.0 lb

## 2016-01-09 DIAGNOSIS — Z3049 Encounter for surveillance of other contraceptives: Secondary | ICD-10-CM | POA: Diagnosis not present

## 2016-01-09 DIAGNOSIS — Z3046 Encounter for surveillance of implantable subdermal contraceptive: Secondary | ICD-10-CM

## 2016-01-09 MED ORDER — PNV PRENATAL PLUS MULTIVITAMIN 27-1 MG PO TABS: 1.0000 | ORAL_TABLET | Freq: Every day | ORAL | 11 refills | Status: DC

## 2016-01-09 NOTE — Progress Notes (Signed)
HPI:  Gina Alvarez 25 y.o. here for Nexplanon removal.  Her future plans for birth control are nothing. Wants to get pregnant.  Past Medical History: Past Medical History:  Diagnosis Date  . Contraception management 08/19/2012   nexplanon inserted 08/19/12 in left arm  . Irregular menstrual bleeding 09/26/2013  . Nexplanon in place 09/26/2013  . No pertinent past medical history     Past Surgical History: Past Surgical History:  Procedure Laterality Date  . NO PAST SURGERIES      Family History: Family History  Problem Relation Age of Onset  . Breast cancer Mother     Social History: Social History  Substance Use Topics  . Smoking status: Current Every Day Smoker    Packs/day: 0.50    Years: 3.00    Types: Cigarettes  . Smokeless tobacco: Never Used     Comment: "Sometimes ready to quit"  . Alcohol use No    Allergies: No Known Allergies     Patient given informed consent for removal of her Nexplanon, time out was performed.  Signed copy in the chart.  Appropriate time out taken. Implanon site identified.  Area prepped in usual sterile fashon. One cc of 1% lidocaine was used to anesthetize the area at the distal end of the implant. A small stab incision was made right beside the implant on the distal portion.  The Nexplanon rod was grasped using hemostats and removed without difficulty.  There was less than 3 cc blood loss. There were no complications.  A small amount of antibiotic ointment and steri-strips were applied over the small incision.  A pressure bandage was applied to reduce any bruising.  The patient tolerated the procedure well and was given post procedure instructions.

## 2016-01-17 ENCOUNTER — Ambulatory Visit (HOSPITAL_BASED_OUTPATIENT_CLINIC_OR_DEPARTMENT_OTHER): Payer: 59 | Admitting: Genetic Counselor

## 2016-01-17 DIAGNOSIS — Z808 Family history of malignant neoplasm of other organs or systems: Secondary | ICD-10-CM

## 2016-01-17 DIAGNOSIS — Z8481 Family history of carrier of genetic disease: Secondary | ICD-10-CM | POA: Diagnosis not present

## 2016-01-17 DIAGNOSIS — Z803 Family history of malignant neoplasm of breast: Secondary | ICD-10-CM | POA: Diagnosis not present

## 2016-01-17 DIAGNOSIS — Z315 Encounter for genetic counseling: Secondary | ICD-10-CM

## 2016-01-18 ENCOUNTER — Encounter: Payer: Self-pay | Admitting: Genetic Counselor

## 2016-01-18 NOTE — Progress Notes (Signed)
REFERRING PROVIDER: Joette Catching, MD [address] [place], [place] [address]  PRIMARY PROVIDER:  Josue Hector, MD  PRIMARY REASON FOR VISIT:  1. Family history of BRCA2 gene positive   2. Family history of breast cancer in mother   3. Family history of skin cancer      HISTORY OF PRESENT ILLNESS:   Gina Alvarez, a 25 y.o. female, was seen for a [place] cancer genetics consultation at the request of Dr. Lysbeth Galas due to a family history of early-onset breast cancer and known family history of a pathogenic BRCA2 gen mutation.  Gina Alvarez presents to clinic today to discuss the possibility of a hereditary predisposition to cancer, genetic testing, and to further clarify her future cancer risks, as well as potential cancer risks for family members.    Gina Alvarez is a 25 y.o. female with no personal history of cancer.     HORMONAL RISK FACTORS:  Menarche was at age 29.  First live birth at age 69.  OCP use for approximately 8 years (nexplanon implant and depo provera)  Ovaries intact: yes.  Hysterectomy: no.  Menopausal status: premenopausal.  HRT use: 0 years. Colonoscopy: no; not examined. Mammogram within the last year: no. Number of breast biopsies: 0. Up to date with pelvic exams:  Due for one - last one was at 21y. Any excessive radiation exposure/other exposures in the past:  no  Past Medical History:  Diagnosis Date  . Contraception management 08/19/2012   nexplanon inserted 08/19/12 in left arm  . Irregular menstrual bleeding 09/26/2013  . Nexplanon in place 09/26/2013  . No pertinent past medical history     Past Surgical History:  Procedure Laterality Date  . NO PAST SURGERIES      Social History   Social History  . Marital status: Single    Spouse name: N/A  . Number of children: N/A  . Years of education: N/A   Social History Main Topics  . Smoking status: Current Every Day Smoker    Packs/day: 0.50    Years: 8.00    Types: Cigarettes  .  Smokeless tobacco: Never Used     Comment: trying to lower in prep for having another baby (12/2015)  . Alcohol use Yes     Comment: maybe 1 drink per month  . Drug use: No  . Sexual activity: Yes    Partners: Male    Birth control/ protection: Condom, Implant   Other Topics Concern  . None   Social History Narrative  . None     FAMILY HISTORY:  We obtained a detailed, 4-generation family history.  Significant diagnoses are listed below: Family History  Problem Relation Age of Onset  . Breast cancer Mother     dx 54-44; BRCA2+; stage III w/ chemo and radiation  . Lupus Paternal Grandmother     d. 51  . Skin cancer Maternal Grandmother     "skin discoloration on arms" - not spotty    Ms. Pendergraph has one daughter, age 18.5 years.  She has one full brother who is currently 70.  She also has one maternal half-sister (26 years old), three paternal half-sisters (25, 25, and 25 years old), and one paternal half-brother who is 41 months old.  Gina Alvarez mother is currently 3.  She was diagnosed with breast cancer at age 64-44 and subsequently had genetic testing which showed that she was positive for a pathogenic BRCA2 gene mutation.  Gina Alvarez does not have a copy  of that report with her today.  Her mother's breast cancer was a stage III and she was treated with chemotherapy and radiation.    Gina Alvarez mother has one maternal half-sister, currently in her 28s, who has not had cancer.  Gina Alvarez maternal grandmother is currently around 65 years old.  She has a history of "skin cancer" which Gina Alvarez describes as skin discoloration on her arms, but she does not describe this as spotty/patchy.  She is not sure if this is sun damage, and she has no further information about it.  Gina Alvarez maternal grandmother has five sisters who have never had cancer.  Gina Alvarez has no information for her maternal grandfather.   Gina Alvarez father has one full sister who is currently 49 and cancer-free.  She  has no children.  Gina Alvarez paternal grandmother died from complications related to lupus at the age of 72.  She had one sister who never had cancer.  Gina Alvarez paternal grandfather is currently 92 and has not had cancer.  Gina Alvarez has no further information for her grandfather's family.  No other family members have had genetic testing for the BRCA2 mutation at this time.  Patient's maternal and paternal ancestors are of Caucasian descent. There is no reported Ashkenazi Jewish ancestry. There is no known consanguinity.  GENETIC COUNSELING ASSESSMENT: Gina Alvarez is a 25 y.o. female with a family history of a pathogenic BRCA2 mutation and family history of breast cancer. We, therefore, discussed and recommended the following at today's visit.   DISCUSSION: We reviewed the characteristics, features and inheritance patterns of hereditary breast and ovarian cancer syndrome.  We discussed that Gina Alvarez has a 50% chance of also having inherited this same BRCA2 mutation from her mother.  We also discussed genetic testing, including the appropriate family members to test, the process of testing, insurance coverage and turn-around-time for results. We discussed the implications of a negative, positive and/or variant of uncertain significant result. We recommended Gina Alvarez pursue genetic testing for the Hereditary Cancer Panel through [facility].  We discussed that we will need a copy of her mother's positive genetic test report, so she will reach out to her mother to obtain this.  Based on Gina Alvarez family history of cancer and mutation status, she meets medical criteria for genetic testing. We discussed that [facility] offers $60 testing for first-degree relatives of a family member who already has a positive genetic test result.  This is a self-pay, upfront cost that Ms. Feasel will pay prior to the lab proceeding with genetic testing.  PLAN: After  considering the risks, benefits, and limitations, Ms. Foister would like to proceed with genetic testing.  We are not collecting the saliva sample today, since we will wait for a copy of Ms. Sabet mother's genetic test report so that we can get the $60 testing price through [employer].  Ms. Keddy will email or fax this test report as soon as she gets a copy.  We can then place the order and either mail her the test kit or we can collect the saliva sample here at the hospital.  Results will then be available within approximately 3 weeks' time, at which point they will be disclosed by telephone to Ms. Trevino, as will any additional recommendations warranted by these results. Ms. Aristizabal will then receive a summary of her genetic counseling visit and a copy of her results once available. This  information will also be available in Epic. We encouraged Ms. Otte to remain in contact with cancer genetics annually so that we can continuously update the family history and inform her of any changes in cancer genetics and testing that may be of benefit for her family. Ms. Briski questions were answered to her satisfaction today. Our contact information was provided should additional questions or concerns arise.  Thank you for the referral and allowing [place] to share in the care of your patient.   Jeanine Luz, MS, [facility] Certified Genetic Counselor [email or web address] Phone: [phone]  The patient was seen for a total of 60 minutes in face-to-face genetic counseling.  This patient was discussed with Drs. Magrinat, Lindi Adie and/or Burr Medico who agrees with the above.    _______________________________________________________________________ For Office Staff:  Number of people involved in session: 1 Was an Intern/ student involved with case: no

## 2016-01-25 ENCOUNTER — Telehealth: Payer: Self-pay | Admitting: Genetic Counselor

## 2016-01-25 NOTE — Telephone Encounter (Signed)
Faxed office note to [facility]

## 2016-05-26 NOTE — L&D Delivery Note (Signed)
Delivery Note At 10:07 AM a viable female was delivered via Vaginal, Spontaneous (Presentation: direct OA).  APGAR: 9, 9; weight pending .   Placenta status:intact 3VC:  with no complications: .  Cord pH: n/a  Baby delivered direct OA with no complications and was placed immediately on mother with a spontaneous cry and good tone.   Cord was clamped after 42min by Dr. Criss Rosales and cut by baby's aunt.  Placenta was delivered intact with minimal bleeding and there were no lacerations that were not hemostatic to exam.  Anesthesia:  epidural Episiotomy: None Lacerations: None Suture Repair: n/a Est. Blood Loss (mL): 50  Mom to postpartum.  Baby to Couplet care / Skin to Skin.  Sherene Sires 05/15/2017, 10:42 AM

## 2016-09-25 ENCOUNTER — Encounter: Payer: Self-pay | Admitting: Adult Health

## 2016-09-25 ENCOUNTER — Ambulatory Visit (INDEPENDENT_AMBULATORY_CARE_PROVIDER_SITE_OTHER): Payer: Managed Care, Other (non HMO) | Admitting: Adult Health

## 2016-09-25 VITALS — BP 102/62 | HR 68 | Ht 60.0 in | Wt 146.0 lb

## 2016-09-25 DIAGNOSIS — O3680X0 Pregnancy with inconclusive fetal viability, not applicable or unspecified: Secondary | ICD-10-CM | POA: Diagnosis not present

## 2016-09-25 DIAGNOSIS — N912 Amenorrhea, unspecified: Secondary | ICD-10-CM | POA: Diagnosis not present

## 2016-09-25 DIAGNOSIS — Z3201 Encounter for pregnancy test, result positive: Secondary | ICD-10-CM | POA: Diagnosis not present

## 2016-09-25 DIAGNOSIS — Z3490 Encounter for supervision of normal pregnancy, unspecified, unspecified trimester: Secondary | ICD-10-CM

## 2016-09-25 DIAGNOSIS — R110 Nausea: Secondary | ICD-10-CM | POA: Diagnosis not present

## 2016-09-25 LAB — POCT URINE PREGNANCY: Preg Test, Ur: NEGATIVE

## 2016-09-25 MED ORDER — DOXYLAMINE-PYRIDOXINE ER 20-20 MG PO TBCR: 1.0000 | EXTENDED_RELEASE_TABLET | Freq: Two times a day (BID) | ORAL | 0 refills | Status: DC

## 2016-09-25 NOTE — Progress Notes (Signed)
Subjective:     Patient ID: Gina Alvarez, female   DOB: June 15, 1990, 26 y.o.   MRN: [id number]  HPI Gina Alvarez is a 26 year old white female, in for UPT, has missed a period and has nausea all day, no vomiting or bleeding.She had nexplanon removed in august 2017 and has been on prenatal vitamins since then.   Review of Systems +missed period +nausea No vomiting or bleeding  Reviewed past medical,surgical, social and family history. Reviewed medications and allergies.     Objective:   Physical Exam BP 102/62 (BP Location: Right Arm, Patient Position: Sitting, Cuff Size: Normal)   Pulse 68   Ht 5' (1.524 m)   Wt 146 lb (66.2 kg)   LMP 08/15/2016 (Exact Date)   BMI 28.51 kg/m UPT +, about 5+6 weeks by LMP with EDD 05/23/16.Skin warm and dry. Neck: mid line trachea, normal thyroid, good ROM, no lymphadenopathy noted. Lungs: clear to ausculation bilaterally. Cardiovascular: regular rate and rhythm.Abdomen is soft and non tender.    Assessment:     1. Positive pregnancy test   2. Pregnancy, unspecified gestational age   8. Encounter to determine fetal viability of pregnancy, single or unspecified fetus   4. Nausea       Plan:    Lot [id number] exp 05/25/17 Meds ordered this encounter  Medications  . Doxylamine-Pyridoxine ER (BONJESTA) 20-20 MG TBCR    Sig: Take 1 tablet by mouth 2 (two) times daily.    Dispense:  24 tablet    Refill:  0    Order Specific Question:   Supervising Provider    Answer:   Tania Ade H [[id number]]  Return in 1 week for dating [place] Eat often in small amounts, try hard candy or gum  Review handout for first trimester

## 2016-09-25 NOTE — Patient Instructions (Signed)
First Trimester of Pregnancy The first trimester of pregnancy is from week 1 until the end of week 13 (months 1 through 3). A week after a sperm fertilizes an egg, the egg will implant on the wall of the uterus. This embryo will begin to develop into a baby. Genes from you and your partner will form the baby. The female genes will determine whether the baby will be a boy or a girl. At 6-8 weeks, the eyes and face will be formed, and the heartbeat can be seen on ultrasound. At the end of 12 weeks, all the baby's organs will be formed. Now that you are pregnant, you will want to do everything you can to have a healthy baby. Two of the most important things are to get good prenatal care and to follow your health care provider's instructions. Prenatal care is all the medical care you receive before the baby's birth. This care will help prevent, find, and treat any problems during the pregnancy and childbirth. Body changes during your first trimester Your body goes through many changes during pregnancy. The changes vary from woman to woman.  You may gain or lose a couple of pounds at first.  You may feel sick to your stomach (nauseous) and you may throw up (vomit). If the vomiting is uncontrollable, call your health care provider.  You may tire easily.  You may develop headaches that can be relieved by medicines. All medicines should be approved by your health care provider.  You may urinate more often. Painful urination may mean you have a bladder infection.  You may develop heartburn as a result of your pregnancy.  You may develop constipation because certain hormones are causing the muscles that push stool through your intestines to slow down.  You may develop hemorrhoids or swollen veins (varicose veins).  Your breasts may begin to grow larger and become tender. Your nipples may stick out more, and the tissue that surrounds them (areola) may become darker.  Your gums may bleed and may be  sensitive to brushing and flossing.  Dark spots or blotches (chloasma, mask of pregnancy) may develop on your face. This will likely fade after the baby is born.  Your menstrual periods will stop.  You may have a loss of appetite.  You may develop cravings for certain kinds of food.  You may have changes in your emotions from day to day, such as being excited to be pregnant or being concerned that something may go wrong with the pregnancy and baby.  You may have more vivid and strange dreams.  You may have changes in your hair. These can include thickening of your hair, rapid growth, and changes in texture. Some women also have hair loss during or after pregnancy, or hair that feels dry or thin. Your hair will most likely return to normal after your baby is born.  What to expect at prenatal visits During a routine prenatal visit:  You will be weighed to make sure you and the baby are growing normally.  Your blood pressure will be taken.  Your abdomen will be measured to track your baby's growth.  The fetal heartbeat will be listened to between weeks 10 and 14 of your pregnancy.  Test results from any previous visits will be discussed.  Your health care provider may ask you:  How you are feeling.  If you are feeling the baby move.  If you have had any abnormal symptoms, such as leaking fluid, bleeding, severe headaches,   or abdominal cramping.  If you are using any tobacco products, including cigarettes, chewing tobacco, and electronic cigarettes.  If you have any questions.  Other tests that may be performed during your first trimester include:  Blood tests to find your blood type and to check for the presence of any previous infections. The tests will also be used to check for low iron levels (anemia) and protein on red blood cells (Rh antibodies). Depending on your risk factors, or if you previously had diabetes during pregnancy, you may have tests to check for high blood  sugar that affects pregnant women (gestational diabetes).  Urine tests to check for infections, diabetes, or protein in the urine.  An ultrasound to confirm the proper growth and development of the baby.  Fetal screens for spinal cord problems (spina bifida) and Down syndrome.  HIV (human immunodeficiency virus) testing. Routine prenatal testing includes screening for HIV, unless you choose not to have this test.  You may need other tests to make sure you and the baby are doing well.  Follow these instructions at home: Medicines  Follow your health care provider's instructions regarding medicine use. Specific medicines may be either safe or unsafe to take during pregnancy.  Take a prenatal vitamin that contains at least 600 micrograms (mcg) of folic acid.  If you develop constipation, try taking a stool softener if your health care provider approves. Eating and drinking  Eat a balanced diet that includes fresh fruits and vegetables, whole grains, good sources of protein such as meat, eggs, or tofu, and low-fat dairy. Your health care provider will help you determine the amount of weight gain that is right for you.  Avoid raw meat and uncooked cheese. These carry germs that can cause birth defects in the baby.  Eating four or five small meals rather than three large meals a day may help relieve nausea and vomiting. If you start to feel nauseous, eating a few soda crackers can be helpful. Drinking liquids between meals, instead of during meals, also seems to help ease nausea and vomiting.  Limit foods that are high in fat and processed sugars, such as fried and sweet foods.  To prevent constipation: ? Eat foods that are high in fiber, such as fresh fruits and vegetables, whole grains, and beans. ? Drink enough fluid to keep your urine clear or pale yellow. Activity  Exercise only as directed by your health care provider. Most women can continue their usual exercise routine during  pregnancy. Try to exercise for 30 minutes at least 5 days a week. Exercising will help you: ? Control your weight. ? Stay in shape. ? Be prepared for labor and delivery.  Experiencing pain or cramping in the lower abdomen or lower back is a good sign that you should stop exercising. Check with your health care provider before continuing with normal exercises.  Try to avoid standing for long periods of time. Move your legs often if you must stand in one place for a long time.  Avoid heavy lifting.  Wear low-heeled shoes and practice good posture.  You may continue to have sex unless your health care provider tells you not to. Relieving pain and discomfort  Wear a good support bra to relieve breast tenderness.  Take warm sitz baths to soothe any pain or discomfort caused by hemorrhoids. Use hemorrhoid cream if your health care provider approves.  Rest with your legs elevated if you have leg cramps or low back pain.  If you develop   varicose veins in your legs, wear support hose. Elevate your feet for 15 minutes, 3-4 times a day. Limit salt in your diet. Prenatal care  Schedule your prenatal visits by the twelfth week of pregnancy. They are usually scheduled monthly at first, then more often in the last 2 months before delivery.  Write down your questions. Take them to your prenatal visits.  Keep all your prenatal visits as told by your health care provider. This is important. Safety  Wear your seat belt at all times when driving.  Make a list of emergency phone numbers, including numbers for family, friends, the hospital, and police and fire departments. General instructions  Ask your health care provider for a referral to a local prenatal education class. Begin classes no later than the beginning of month 6 of your pregnancy.  Ask for help if you have counseling or nutritional needs during pregnancy. Your health care provider can offer advice or refer you to specialists for help  with various needs.  Do not use hot tubs, steam rooms, or saunas.  Do not douche or use tampons or scented sanitary pads.  Do not cross your legs for long periods of time.  Avoid cat litter boxes and soil used by cats. These carry germs that can cause birth defects in the baby and possibly loss of the fetus by miscarriage or stillbirth.  Avoid all smoking, herbs, alcohol, and medicines not prescribed by your health care provider. Chemicals in these products affect the formation and growth of the baby.  Do not use any products that contain nicotine or tobacco, such as cigarettes and e-cigarettes. If you need help quitting, ask your health care provider. You may receive counseling support and other resources to help you quit.  Schedule a dentist appointment. At home, brush your teeth with a soft toothbrush and be gentle when you floss. Contact a health care provider if:  You have dizziness.  You have mild pelvic cramps, pelvic pressure, or nagging pain in the abdominal area.  You have persistent nausea, vomiting, or diarrhea.  You have a bad smelling vaginal discharge.  You have pain when you urinate.  You notice increased swelling in your face, hands, legs, or ankles.  You are exposed to fifth disease or chickenpox.  You are exposed to German measles (rubella) and have never had it. Get help right away if:  You have a fever.  You are leaking fluid from your vagina.  You have spotting or bleeding from your vagina.  You have severe abdominal cramping or pain.  You have rapid weight gain or loss.  You vomit blood or material that looks like coffee grounds.  You develop a severe headache.  You have shortness of breath.  You have any kind of trauma, such as from a fall or a car accident. Summary  The first trimester of pregnancy is from week 1 until the end of week 13 (months 1 through 3).  Your body goes through many changes during pregnancy. The changes vary from  woman to woman.  You will have routine prenatal visits. During those visits, your health care provider will examine you, discuss any test results you may have, and talk with you about how you are feeling. This information is not intended to replace advice given to you by your health care provider. Make sure you discuss any questions you have with your health care provider. Document Released: 05/06/2001 Document Revised: 04/23/2016 Document Reviewed: 04/23/2016 Elsevier Interactive Patient Education  2017 Elsevier   Inc.  

## 2016-09-30 ENCOUNTER — Other Ambulatory Visit: Payer: Self-pay | Admitting: Obstetrics & Gynecology

## 2016-09-30 DIAGNOSIS — O3680X0 Pregnancy with inconclusive fetal viability, not applicable or unspecified: Secondary | ICD-10-CM

## 2016-10-02 ENCOUNTER — Ambulatory Visit (INDEPENDENT_AMBULATORY_CARE_PROVIDER_SITE_OTHER): Payer: Managed Care, Other (non HMO)

## 2016-10-02 DIAGNOSIS — O3680X0 Pregnancy with inconclusive fetal viability, not applicable or unspecified: Secondary | ICD-10-CM | POA: Diagnosis not present

## 2016-10-02 NOTE — Progress Notes (Signed)
[place] 7+5 wks,single IUP w/ys,pos fht 162 bpm,normal ovaries bilat,crl 14.2 mm

## 2016-10-15 ENCOUNTER — Telehealth: Payer: Self-pay | Admitting: *Deleted

## 2016-10-15 NOTE — Telephone Encounter (Signed)
Patient called with complaints of a cold, sore throat, congestion but no fever since Sunday.  Informed patient that her symptoms may peak around day 5 but to just treat the symptoms. Advised patient that since she is still in her first trimester, there are no meds that are 100% safe during pregnancy. She does state she has problems with her allergies. Advised if she felt like she needed something she could use Claritin or Zyrtec, saline nasal spray, cepacol lozenges or choloraseptic throat spray. Verbalized understanding. Will call back if symptoms do not improve.

## 2016-10-21 ENCOUNTER — Ambulatory Visit: Payer: Managed Care, Other (non HMO) | Admitting: *Deleted

## 2016-10-21 ENCOUNTER — Other Ambulatory Visit (HOSPITAL_COMMUNITY)
Admission: RE | Admit: 2016-10-21 | Discharge: 2016-10-21 | Disposition: A | Discharge status: Home / Self Care | Payer: Managed Care, Other (non HMO) | Source: Ambulatory Visit | Attending: Advanced Practice Midwife | Admitting: Advanced Practice Midwife

## 2016-10-21 ENCOUNTER — Ambulatory Visit (INDEPENDENT_AMBULATORY_CARE_PROVIDER_SITE_OTHER): Payer: Managed Care, Other (non HMO) | Admitting: Advanced Practice Midwife

## 2016-10-21 ENCOUNTER — Encounter: Payer: Self-pay | Admitting: Advanced Practice Midwife

## 2016-10-21 VITALS — BP 100/60 | HR 103 | Wt 143.5 lb

## 2016-10-21 DIAGNOSIS — Z124 Encounter for screening for malignant neoplasm of cervix: Secondary | ICD-10-CM

## 2016-10-21 DIAGNOSIS — Z3A10 10 weeks gestation of pregnancy: Secondary | ICD-10-CM

## 2016-10-21 DIAGNOSIS — Z3481 Encounter for supervision of other normal pregnancy, first trimester: Secondary | ICD-10-CM | POA: Insufficient documentation

## 2016-10-21 DIAGNOSIS — Z3490 Encounter for supervision of normal pregnancy, unspecified, unspecified trimester: Secondary | ICD-10-CM | POA: Insufficient documentation

## 2016-10-21 DIAGNOSIS — Z331 Pregnant state, incidental: Secondary | ICD-10-CM

## 2016-10-21 DIAGNOSIS — Z1389 Encounter for screening for other disorder: Secondary | ICD-10-CM

## 2016-10-21 DIAGNOSIS — Z8481 Family history of carrier of genetic disease: Secondary | ICD-10-CM

## 2016-10-21 DIAGNOSIS — Z3682 Encounter for antenatal screening for nuchal translucency: Secondary | ICD-10-CM

## 2016-10-21 LAB — POCT URINALYSIS DIPSTICK
GLUCOSE UA: NEGATIVE
Ketones, UA: NEGATIVE
LEUKOCYTES UA: NEGATIVE
NITRITE UA: NEGATIVE
Protein, UA: NEGATIVE
RBC UA: NEGATIVE

## 2016-10-21 NOTE — Patient Instructions (Signed)
 First Trimester of Pregnancy The first trimester of pregnancy is from week 1 until the end of week 12 (months 1 through 3). A week after a sperm fertilizes an egg, the egg will implant on the wall of the uterus. This embryo will begin to develop into a baby. Genes from you and your partner are forming the baby. The female genes determine whether the baby is a boy or a girl. At 6-8 weeks, the eyes and face are formed, and the heartbeat can be seen on ultrasound. At the end of 12 weeks, all the baby's organs are formed.  Now that you are pregnant, you will want to do everything you can to have a healthy baby. Two of the most important things are to get good prenatal care and to follow your health care provider's instructions. Prenatal care is all the medical care you receive before the baby's birth. This care will help prevent, find, and treat any problems during the pregnancy and childbirth. BODY CHANGES Your body goes through many changes during pregnancy. The changes vary from woman to woman.   You may gain or lose a couple of pounds at first.  You may feel sick to your stomach (nauseous) and throw up (vomit). If the vomiting is uncontrollable, call your health care provider.  You may tire easily.  You may develop headaches that can be relieved by medicines approved by your health care provider.  You may urinate more often. Painful urination may mean you have a bladder infection.  You may develop heartburn as a result of your pregnancy.  You may develop constipation because certain hormones are causing the muscles that push waste through your intestines to slow down.  You may develop hemorrhoids or swollen, bulging veins (varicose veins).  Your breasts may begin to grow larger and become tender. Your nipples may stick out more, and the tissue that surrounds them (areola) may become darker.  Your gums may bleed and may be sensitive to brushing and flossing.  Dark spots or blotches  (chloasma, mask of pregnancy) may develop on your face. This will likely fade after the baby is born.  Your menstrual periods will stop.  You may have a loss of appetite.  You may develop cravings for certain kinds of food.  You may have changes in your emotions from day to day, such as being excited to be pregnant or being concerned that something may go wrong with the pregnancy and baby.  You may have more vivid and strange dreams.  You may have changes in your hair. These can include thickening of your hair, rapid growth, and changes in texture. Some women also have hair loss during or after pregnancy, or hair that feels dry or thin. Your hair will most likely return to normal after your baby is born. WHAT TO EXPECT AT YOUR PRENATAL VISITS During a routine prenatal visit:  You will be weighed to make sure you and the baby are growing normally.  Your blood pressure will be taken.  Your abdomen will be measured to track your baby's growth.  The fetal heartbeat will be listened to starting around week 10 or 12 of your pregnancy.  Test results from any previous visits will be discussed. Your health care provider may ask you:  How you are feeling.  If you are feeling the baby move.  If you have had any abnormal symptoms, such as leaking fluid, bleeding, severe headaches, or abdominal cramping.  If you have any questions. Other   tests that may be performed during your first trimester include:  Blood tests to find your blood type and to check for the presence of any previous infections. They will also be used to check for low iron levels (anemia) and Rh antibodies. Later in the pregnancy, blood tests for diabetes will be done along with other tests if problems develop.  Urine tests to check for infections, diabetes, or protein in the urine.  An ultrasound to confirm the proper growth and development of the baby.  An amniocentesis to check for possible genetic problems.  Fetal  screens for spina bifida and Down syndrome.  You may need other tests to make sure you and the baby are doing well. HOME CARE INSTRUCTIONS  Medicines  Follow your health care provider's instructions regarding medicine use. Specific medicines may be either safe or unsafe to take during pregnancy.  Take your prenatal vitamins as directed.  If you develop constipation, try taking a stool softener if your health care provider approves. Diet  Eat regular, well-balanced meals. Choose a variety of foods, such as meat or vegetable-based protein, fish, milk and low-fat dairy products, vegetables, fruits, and whole grain breads and cereals. Your health care provider will help you determine the amount of weight gain that is right for you.  Avoid raw meat and uncooked cheese. These carry germs that can cause birth defects in the baby.  Eating four or five small meals rather than three large meals a day may help relieve nausea and vomiting. If you start to feel nauseous, eating a few soda crackers can be helpful. Drinking liquids between meals instead of during meals also seems to help nausea and vomiting.  If you develop constipation, eat more high-fiber foods, such as fresh vegetables or fruit and whole grains. Drink enough fluids to keep your urine clear or pale yellow. Activity and Exercise  Exercise only as directed by your health care provider. Exercising will help you:  Control your weight.  Stay in shape.  Be prepared for labor and delivery.  Experiencing pain or cramping in the lower abdomen or low back is a good sign that you should stop exercising. Check with your health care provider before continuing normal exercises.  Try to avoid standing for long periods of time. Move your legs often if you must stand in one place for a long time.  Avoid heavy lifting.  Wear low-heeled shoes, and practice good posture.  You may continue to have sex unless your health care provider directs you  otherwise. Relief of Pain or Discomfort  Wear a good support bra for breast tenderness.   Take warm sitz baths to soothe any pain or discomfort caused by hemorrhoids. Use hemorrhoid cream if your health care provider approves.   Rest with your legs elevated if you have leg cramps or low back pain.  If you develop varicose veins in your legs, wear support hose. Elevate your feet for 15 minutes, 3-4 times a day. Limit salt in your diet. Prenatal Care  Schedule your prenatal visits by the twelfth week of pregnancy. They are usually scheduled monthly at first, then more often in the last 2 months before delivery.  Write down your questions. Take them to your prenatal visits.  Keep all your prenatal visits as directed by your health care provider. Safety  Wear your seat belt at all times when driving.  Make a list of emergency phone numbers, including numbers for family, friends, the hospital, and police and fire departments. General   Tips  Ask your health care provider for a referral to a local prenatal education class. Begin classes no later than at the beginning of month 6 of your pregnancy.  Ask for help if you have counseling or nutritional needs during pregnancy. Your health care provider can offer advice or refer you to specialists for help with various needs.  Do not use hot tubs, steam rooms, or saunas.  Do not douche or use tampons or scented sanitary pads.  Do not cross your legs for long periods of time.  Avoid cat litter boxes and soil used by cats. These carry germs that can cause birth defects in the baby and possibly loss of the fetus by miscarriage or stillbirth.  Avoid all smoking, herbs, alcohol, and medicines not prescribed by your health care provider. Chemicals in these affect the formation and growth of the baby.  Schedule a dentist appointment. At home, brush your teeth with a soft toothbrush and be gentle when you floss. SEEK MEDICAL CARE IF:   You have  dizziness.  You have mild pelvic cramps, pelvic pressure, or nagging pain in the abdominal area.  You have persistent nausea, vomiting, or diarrhea.  You have a bad smelling vaginal discharge.  You have pain with urination.  You notice increased swelling in your face, hands, legs, or ankles. SEEK IMMEDIATE MEDICAL CARE IF:   You have a fever.  You are leaking fluid from your vagina.  You have spotting or bleeding from your vagina.  You have severe abdominal cramping or pain.  You have rapid weight gain or loss.  You vomit blood or material that looks like coffee grounds.  You are exposed to German measles and have never had them.  You are exposed to fifth disease or chickenpox.  You develop a severe headache.  You have shortness of breath.  You have any kind of trauma, such as from a fall or a car accident. Document Released: 05/06/2001 Document Revised: 09/26/2013 Document Reviewed: 03/22/2013 Exit[employer] Patient Information 2015 [employer]. This information is not intended to replace advice given to you by your health care provider. Make sure you discuss any questions you have with your health care provider.   Nausea & Vomiting  Have saltine crackers or pretzels by your bed and eat a few bites before you raise your head out of bed in the morning  Eat small frequent meals throughout the day instead of large meals  Drink plenty of fluids throughout the day to stay hydrated, just don't drink a lot of fluids with your meals.  This can make your stomach fill up faster making you feel sick  Do not brush your teeth right after you eat  Products with real ginger are good for nausea, like ginger ale and ginger hard candy Make sure it says made with real ginger!  Sucking on sour candy like lemon heads is also good for nausea  If your prenatal vitamins make you nauseated, take them at night so you will sleep through the nausea  Sea Bands  If you feel like you need  medicine for the nausea & vomiting please let us know  If you are unable to keep any fluids or food down please let us know   Constipation  Drink plenty of fluid, preferably water, throughout the day  Eat foods high in fiber such as fruits, vegetables, and grains  Exercise, such as walking, is a good way to keep your bowels regular  Drink warm fluids, especially warm   prune juice, or decaf coffee  Eat a 1/2 cup of real oatmeal (not instant), 1/2 cup applesauce, and 1/2-1 cup warm prune juice every day  If needed, you may take Colace (docusate sodium) stool softener once or twice a day to help keep the stool soft. If you are pregnant, wait until you are out of your first trimester (12-14 weeks of pregnancy)  If you still are having problems with constipation, you may take Miralax once daily as needed to help keep your bowels regular.  If you are pregnant, wait until you are out of your first trimester (12-14 weeks of pregnancy)  Safe Medications in Pregnancy   Acne: Benzoyl Peroxide Salicylic Acid  Backache/Headache: Tylenol: 2 regular strength every 4 hours OR              2 Extra strength every 6 hours  Colds/Coughs/Allergies: Benadryl (alcohol free) 25 mg every 6 hours as needed Breath right strips Claritin Cepacol throat lozenges Chloraseptic throat spray Cold-Eeze- up to three times per day Cough drops, alcohol free Flonase (by prescription only) Guaifenesin Mucinex Robitussin DM (plain only, alcohol free) Saline nasal spray/drops Sudafed (pseudoephedrine) & Actifed ** use only after [redacted] weeks gestation and if you do not have high blood pressure Tylenol Vicks Vaporub Zinc lozenges Zyrtec   Constipation: Colace Ducolax suppositories Fleet enema Glycerin suppositories Metamucil Milk of magnesia Miralax Senokot Smooth move tea  Diarrhea: Kaopectate Imodium A-D  *NO pepto Bismol  Hemorrhoids: Anusol Anusol HC Preparation  H Tucks  Indigestion: Tums Maalox Mylanta Zantac  Pepcid  Insomnia: Benadryl (alcohol free) 25mg every 6 hours as needed Tylenol PM Unisom, no Gelcaps  Leg Cramps: Tums MagGel  Nausea/Vomiting:  Bonine Dramamine Emetrol Ginger extract Sea bands Meclizine  Nausea medication to take during pregnancy:  Unisom (doxylamine succinate 25 mg tablets) Take one tablet daily at bedtime. If symptoms are not adequately controlled, the dose can be increased to a maximum recommended dose of two tablets daily (1/2 tablet in the morning, 1/2 tablet mid-afternoon and one at bedtime). Vitamin B6 100mg tablets. Take one tablet twice a day (up to 200 mg per day).  Skin Rashes: Aveeno products Benadryl cream or 25mg every 6 hours as needed Calamine Lotion 1% cortisone cream  Yeast infection: Gyne-lotrimin 7 Monistat 7   **If taking multiple medications, please check labels to avoid duplicating the same active ingredients **take medication as directed on the label ** Do not exceed 4000 mg of tylenol in 24 hours **Do not take medications that contain aspirin or ibuprofen      

## 2016-10-21 NOTE — Progress Notes (Signed)
  Subjective:    Gina Alvarez is a [id number] 60w3dbeing seen today for her first obstetrical visit.  Her obstetrical history is significant for 2nd baby.  Pregnancy history fully reviewed.  Patient reports no complaints  Doesn't want any more diclegis/bonjesta.  Vitals:   10/21/16 1358  BP: 100/60  Pulse: (!) 103  Weight: 143 lb 8 oz (65.1 kg)    HISTORY: OB History  Gravida Para Term Preterm AB Living  '3 1 1   1 1  '$ SAB TAB Ectopic Multiple Live Births  1       1    # Outcome Date GA Lbr Len/2nd Weight Sex Delivery Anes PTL Lv  3 Current           2 SAB 05/2015          1 Term 06/12/12 445w2d8:15 / 01:55 7 lb 0.4 oz (3.185 kg) F Vag-Vacuum EPI N LIV     Past Medical History:  Diagnosis Date  . Contraception management 08/19/2012   nexplanon inserted 08/19/12 in left arm  . Irregular menstrual bleeding 09/26/2013  . Nexplanon in place 09/26/2013  . No pertinent past medical history    Past Surgical History:  Procedure Laterality Date  . NO PAST SURGERIES     Family History  Problem Relation Age of Onset  . Breast cancer Mother        dx [id number]; stage III w/ chemo and radiation  . Asthma Daughter   . Lupus Paternal Grandmother        d. 5623. Skin cancer Maternal Grandmother        "skin discoloration on arms" - not spotty  . Mental illness Maternal Aunt      Exam       Pelvic Exam:    Perineum: Normal Perineum   Vulva: normal   Vagina:  normal mucosa, normal discharge, no palpable nodules   Uterus Normal, Gravid, FH: 10     Cervix: normal   Adnexa: Not palpable   Urinary:  urethral meatus normal    System:     Skin: normal coloration and turgor, no rashes    Neurologic: oriented, normal, normal mood   Extremities: normal strength, tone, and muscle mass   HEENT PERRLA   Mouth/Teeth mucous membranes moist, normal dentition   Neck supple and no masses   Cardiovascular: regular rate and rhythm   Respiratory:  appears well, vitals normal, no  respiratory distress, acyanotic   Abdomen: soft, non-tender;  FHR: 160 [place]        Assessment:    Pregnancy: G3P1011 Patient Active Problem List   Diagnosis Date Noted  . Supervision of normal pregnancy 10/21/2016  . FHx: BRCA gene positive 10/21/2016  . Urinary retention 06/14/2012        Plan:     Initial labs drawn. Continue prenatal vitamins  Problem list reviewed and updated  Reviewed n/v relief measures and warning s/s to report  Reviewed recommended weight gain based on pre-gravid BMI  Encouraged well-balanced diet Genetic Screening discussed Integrated Screen: requested.  Ultrasound discussed; fetal survey: requested.  Return in about 2 weeks (around 11/04/2016) for LROB, US:NT+1st IT.  CRESENZO-DISHMAN,Danaija Eskridge 10/21/2016

## 2016-10-22 LAB — VARICELLA ZOSTER ANTIBODY, IGG: VARICELLA: 503 {index} (ref >165)

## 2016-10-22 LAB — PMP SCREEN PROFILE (10S), URINE
Amphetamine Scrn, Ur: NEGATIVE ng/mL
BARBITURATE SCREEN URINE: NEGATIVE ng/mL
BENZODIAZEPINE SCREEN, URINE: NEGATIVE ng/mL
CANNABINOIDS UR QL SCN: NEGATIVE ng/mL
COCAINE(METAB.)SCREEN, URINE: NEGATIVE ng/mL
Creatinine(Crt), U: 251.7 mg/dL (ref 20.0–300.0)
Methadone Screen, Urine: NEGATIVE ng/mL
OXYCODONE+OXYMORPHONE UR QL SCN: NEGATIVE ng/mL
Opiate Scrn, Ur: NEGATIVE ng/mL
PH UR, DRUG SCRN: 5.3 (ref 4.5–8.9)
Phencyclidine Qn, Ur: NEGATIVE ng/mL
Propoxyphene Scrn, Ur: NEGATIVE ng/mL

## 2016-10-22 LAB — URINALYSIS, ROUTINE W REFLEX MICROSCOPIC
BILIRUBIN UA: NEGATIVE
GLUCOSE, UA: NEGATIVE
LEUKOCYTES UA: NEGATIVE
Nitrite, UA: NEGATIVE
RBC UA: NEGATIVE
Urobilinogen, Ur: 1 mg/dL (ref 0.2–1.0)
pH, UA: 5 (ref 5.0–7.5)

## 2016-10-22 LAB — CBC
HEMOGLOBIN: 11.5 g/dL (ref 11.1–15.9)
Hematocrit: 34.6 % (ref 34.0–46.6)
MCH: 31 pg (ref 26.6–33.0)
MCHC: 33.2 g/dL (ref 31.5–35.7)
MCV: 93 fL (ref 79–97)
PLATELETS: 323 10*3/uL (ref 150–379)
RBC: 3.71 x10E6/uL — AB (ref 3.77–5.28)
RDW: 12.8 % (ref 12.3–15.4)
WBC: 10.8 10*3/uL (ref 3.4–10.8)

## 2016-10-22 LAB — ABO/RH: RH TYPE: POSITIVE

## 2016-10-22 LAB — HIV ANTIBODY (ROUTINE TESTING W REFLEX): HIV SCREEN 4TH GENERATION: NONREACTIVE

## 2016-10-22 LAB — HEPATITIS B SURFACE ANTIGEN: HEP B S AG: NEGATIVE

## 2016-10-22 LAB — RPR: RPR Ser Ql: NONREACTIVE

## 2016-10-22 LAB — ANTIBODY SCREEN: Antibody Screen: NEGATIVE

## 2016-10-22 LAB — RUBELLA SCREEN: Rubella Antibodies, IGG: 1.3 index (ref >0.99)

## 2016-10-23 LAB — CYTOLOGY - PAP
Chlamydia: NEGATIVE
DIAGNOSIS: NEGATIVE
NEISSERIA GONORRHEA: NEGATIVE

## 2016-10-23 LAB — URINE CULTURE: Organism ID, Bacteria: NO GROWTH

## 2016-11-04 ENCOUNTER — Ambulatory Visit (INDEPENDENT_AMBULATORY_CARE_PROVIDER_SITE_OTHER): Payer: Managed Care, Other (non HMO)

## 2016-11-04 ENCOUNTER — Encounter: Payer: Self-pay | Admitting: Obstetrics & Gynecology

## 2016-11-04 ENCOUNTER — Ambulatory Visit (INDEPENDENT_AMBULATORY_CARE_PROVIDER_SITE_OTHER): Payer: Managed Care, Other (non HMO) | Admitting: Obstetrics & Gynecology

## 2016-11-04 VITALS — BP 100/64 | HR 101 | Wt 146.0 lb

## 2016-11-04 DIAGNOSIS — Z3682 Encounter for antenatal screening for nuchal translucency: Secondary | ICD-10-CM

## 2016-11-04 DIAGNOSIS — Z1389 Encounter for screening for other disorder: Secondary | ICD-10-CM

## 2016-11-04 DIAGNOSIS — Z3401 Encounter for supervision of normal first pregnancy, first trimester: Secondary | ICD-10-CM

## 2016-11-04 DIAGNOSIS — Z3481 Encounter for supervision of other normal pregnancy, first trimester: Secondary | ICD-10-CM

## 2016-11-04 DIAGNOSIS — Z331 Pregnant state, incidental: Secondary | ICD-10-CM

## 2016-11-04 LAB — POCT URINALYSIS DIPSTICK
Glucose, UA: NEGATIVE
KETONES UA: NEGATIVE
Nitrite, UA: NEGATIVE
Protein, UA: NEGATIVE
RBC UA: NEGATIVE

## 2016-11-04 NOTE — Progress Notes (Signed)
[place] 12+3 wks,measurements c/w dates,normal ovaries bilat,NB present,NT 1.4 mm, crl 60.4 mm

## 2016-11-04 NOTE — Progress Notes (Signed)
[id number] [redacted]w[redacted]d Estimated Date of Delivery: 05/16/17  Blood pressure 100/64, pulse (!) 101, weight 146 lb (66.2 kg), last menstrual period 08/15/2016.   BP weight and urine results all reviewed and noted.  Please refer to the obstetrical flow sheet for the fundal height and fetal heart rate documentation:  Patient reports good fetal movement, denies any bleeding and no rupture of membranes symptoms or regular contractions. Patient is without complaints. All questions were answered.  Orders Placed This Encounter  Procedures  . Maternal Screen, Integrated #1  . POCT urinalysis dipstick    Plan:  Continued routine obstetrical care, NT is normal see report  Return in about 4 weeks (around 12/02/2016) for 2nd IT, LROB.

## 2016-11-07 LAB — MATERNAL SCREEN, INTEGRATED #1
CROWN RUMP LENGTH MAT SCREEN: 60.4 mm
Gest. Age on Collection Date: 12.4 weeks
Maternal Age at EDD: 26.5 yr
NUCHAL TRANSLUCENCY (NT): 1.4 mm
Number of Fetuses: 1
PAPP-A Value: 1614.1 ng/mL
Weight: 146 [lb_av]

## 2016-12-02 ENCOUNTER — Encounter: Payer: Self-pay | Admitting: Obstetrics & Gynecology

## 2016-12-02 ENCOUNTER — Ambulatory Visit (INDEPENDENT_AMBULATORY_CARE_PROVIDER_SITE_OTHER): Payer: Managed Care, Other (non HMO) | Admitting: Obstetrics & Gynecology

## 2016-12-02 VITALS — BP 90/50 | HR 82 | Wt 150.0 lb

## 2016-12-02 DIAGNOSIS — Z331 Pregnant state, incidental: Secondary | ICD-10-CM

## 2016-12-02 DIAGNOSIS — Z1389 Encounter for screening for other disorder: Secondary | ICD-10-CM

## 2016-12-02 DIAGNOSIS — Z3492 Encounter for supervision of normal pregnancy, unspecified, second trimester: Secondary | ICD-10-CM

## 2016-12-02 DIAGNOSIS — Z3A16 16 weeks gestation of pregnancy: Secondary | ICD-10-CM

## 2016-12-02 DIAGNOSIS — Z3682 Encounter for antenatal screening for nuchal translucency: Secondary | ICD-10-CM

## 2016-12-02 LAB — POCT URINALYSIS DIPSTICK
Blood, UA: NEGATIVE
Glucose, UA: NEGATIVE
KETONES UA: NEGATIVE
Nitrite, UA: NEGATIVE

## 2016-12-02 NOTE — Progress Notes (Signed)
[id number] [redacted]w[redacted]d Estimated Date of Delivery: 05/16/17  Blood pressure (!) 90/50, pulse 82, weight 150 lb (68 kg), last menstrual period 08/15/2016.   BP weight and urine results all reviewed and noted.  Please refer to the obstetrical flow sheet for the fundal height and fetal heart rate documentation:  Patient reports good fetal movement, denies any bleeding and no rupture of membranes symptoms or regular contractions. Patient is without complaints. All questions were answered.  Orders Placed This Encounter  Procedures  . US OB Comp + 14 Wk  . Maternal Screen, Integrated #2  . POCT urinalysis dipstick    Plan:  Continued routine obstetrical care,   Return in about 3 weeks (around 12/23/2016) for 20 week sono, LROB.

## 2016-12-06 LAB — MATERNAL SCREEN, INTEGRATED #2
ADSF: 1.13
AFP MOM: 1.27
Alpha-Fetoprotein: 42.3 ng/mL
CROWN RUMP LENGTH: 60.4 mm
DIA MoM: 2.38
DIA VALUE: 403.2 pg/mL
ESTRIOL UNCONJUGATED: 0.95 ng/mL
GEST. AGE ON COLLECTION DATE: 12.4 wk
Gestational Age: 16.4 weeks
Maternal Age at EDD: 26.5 yr
NUCHAL TRANSLUCENCY (NT): 1.4 mm
NUCHAL TRANSLUCENCY MOM: 0.91
NUMBER OF FETUSES: 1
PAPP-A MoM: 1.73
PAPP-A VALUE: 1614.1 ng/mL
TEST RESULTS: NEGATIVE
WEIGHT: 146 [lb_av]
WEIGHT: 146 [lb_av]
hCG MoM: 2.01
hCG Value: 63.3 IU/mL

## 2016-12-23 ENCOUNTER — Ambulatory Visit (INDEPENDENT_AMBULATORY_CARE_PROVIDER_SITE_OTHER): Payer: Managed Care, Other (non HMO) | Admitting: Advanced Practice Midwife

## 2016-12-23 ENCOUNTER — Encounter: Payer: Self-pay | Admitting: Advanced Practice Midwife

## 2016-12-23 ENCOUNTER — Other Ambulatory Visit: Payer: Self-pay | Admitting: Obstetrics & Gynecology

## 2016-12-23 ENCOUNTER — Ambulatory Visit (INDEPENDENT_AMBULATORY_CARE_PROVIDER_SITE_OTHER): Payer: Managed Care, Other (non HMO)

## 2016-12-23 VITALS — BP 96/50 | HR 95 | Wt 154.0 lb

## 2016-12-23 DIAGNOSIS — Z3A19 19 weeks gestation of pregnancy: Secondary | ICD-10-CM

## 2016-12-23 DIAGNOSIS — Z331 Pregnant state, incidental: Secondary | ICD-10-CM

## 2016-12-23 DIAGNOSIS — Z363 Encounter for antenatal screening for malformations: Secondary | ICD-10-CM

## 2016-12-23 DIAGNOSIS — Z3482 Encounter for supervision of other normal pregnancy, second trimester: Secondary | ICD-10-CM

## 2016-12-23 DIAGNOSIS — Z3402 Encounter for supervision of normal first pregnancy, second trimester: Secondary | ICD-10-CM

## 2016-12-23 DIAGNOSIS — Z1389 Encounter for screening for other disorder: Secondary | ICD-10-CM

## 2016-12-23 LAB — POCT URINALYSIS DIPSTICK
Glucose, UA: NEGATIVE
Ketones, UA: NEGATIVE
LEUKOCYTES UA: NEGATIVE
NITRITE UA: NEGATIVE
PROTEIN UA: NEGATIVE

## 2016-12-23 NOTE — Progress Notes (Signed)
[id number] [redacted]w[redacted]d Estimated Date of Delivery: 05/16/17  Blood pressure (!) 96/50, pulse 95, weight 154 lb (69.9 kg), last menstrual period 08/15/2016.   BP weight and urine results all reviewed and noted. [place] 41+9 wks,cephalic, cx 3.7 cm,post pl gr 0,normal ovaries bilat,5.1 cm,fhr 140 bpm,EFW 275 g,anatomy complete no obvious abnormalities seen     Please refer to the obstetrical flow sheet for the fundal height and fetal heart rate documentation:  Patient reports good fetal movement, denies any bleeding and no rupture of membranes symptoms or regular contractions. Patient is without complaints. All questions were answered.  Orders Placed This Encounter  Procedures  . POCT Urinalysis Dipstick    Plan:  Continued routine obstetrical care,   Return in about 4 weeks (around 01/20/2017) for [place].

## 2016-12-23 NOTE — Patient Instructions (Signed)

## 2016-12-23 NOTE — Progress Notes (Signed)
[place] 37+4 wks,cephalic, cx 3.7 cm,post pl gr 0,normal ovaries bilat,5.1 cm,fhr 140 bpm,EFW 275 g,anatomy complete no obvious abnormalities seen

## 2016-12-30 ENCOUNTER — Encounter (HOSPITAL_COMMUNITY): Payer: Self-pay

## 2017-01-20 ENCOUNTER — Ambulatory Visit (INDEPENDENT_AMBULATORY_CARE_PROVIDER_SITE_OTHER): Payer: Managed Care, Other (non HMO) | Admitting: Advanced Practice Midwife

## 2017-01-20 ENCOUNTER — Encounter: Payer: Self-pay | Admitting: Advanced Practice Midwife

## 2017-01-20 ENCOUNTER — Other Ambulatory Visit: Payer: Self-pay | Admitting: Advanced Practice Midwife

## 2017-01-20 VITALS — BP 98/54 | HR 103 | Wt 159.0 lb

## 2017-01-20 DIAGNOSIS — Z3A23 23 weeks gestation of pregnancy: Secondary | ICD-10-CM

## 2017-01-20 DIAGNOSIS — Z3482 Encounter for supervision of other normal pregnancy, second trimester: Secondary | ICD-10-CM

## 2017-01-20 DIAGNOSIS — Z331 Pregnant state, incidental: Secondary | ICD-10-CM

## 2017-01-20 DIAGNOSIS — Z1389 Encounter for screening for other disorder: Secondary | ICD-10-CM

## 2017-01-20 LAB — POCT URINALYSIS DIPSTICK
Glucose, UA: NEGATIVE
Ketones, UA: NEGATIVE
LEUKOCYTES UA: NEGATIVE
Nitrite, UA: NEGATIVE
Protein, UA: NEGATIVE
RBC UA: NEGATIVE

## 2017-01-20 MED ORDER — PRENATE MINI 18-0.6-0.4-350 MG PO CAPS: 1.0000 | ORAL_CAPSULE | Freq: Every day | ORAL | 11 refills | Status: DC

## 2017-01-20 NOTE — Progress Notes (Signed)
[id number] [redacted]w[redacted]d Estimated Date of Delivery: 05/16/17  Blood pressure (!) 98/54, pulse (!) 103, weight 159 lb (72.1 kg), last menstrual period 08/15/2016.   BP weight and urine results all reviewed and noted.  Please refer to the obstetrical flow sheet for the fundal height and fetal heart rate documentation:  Patient reports good fetal movement, denies any bleeding and no rupture of membranes symptoms or regular contractions. Patient is without complaints. All questions were answered.  Orders Placed This Encounter  Procedures  . POCT urinalysis dipstick    Plan:  Continued routine obstetrical care,   Return in about 4 weeks (around 02/17/2017) for PN2/LROB.

## 2017-01-20 NOTE — Patient Instructions (Signed)
1. Before your test, do not eat or drink anything for 8-10 hours prior to your  appointment (a small amount of water is allowed and you may take any medicines you normally take). Be sure to drink lots of water the day before. 2. When you arrive, your blood will be drawn for a 'fasting' blood sugar level.  Then you will be given a sweetened carbonated beverage to drink. You should  complete drinking this beverage within five minutes. After finishing the  beverage, you will have your blood drawn exactly 1 and 2 hours later. Having  your blood drawn on time is an important part of this test. A total of three blood  samples will be done. 3. The test takes approximately 2  hours. During the test, do not have anything to  eat or drink. Do not smoke, chew gum (not even sugarless gum) or use breath mints.  4. During the test you should remain close by and seated as much as possible and  avoid walking around. You may want to bring a book or something else to  occupy your time.  5. After your test, you may eat and drink as normal. You may want to bring a snack  to eat after the test is finished. Your provider will advise you as to the results of  this test and any follow-up if necessary  If your sugar test is positive for gestational diabetes, you will be given an phone call and further instructions discussed. If you wish to know all of your test results before your next appointment, feel free to call the office, or look up your test results on Mychart.  (The range that the lab uses for normal values of the sugar test are not necessarily the range that is used for pregnant women; if your results are within the normal range, they are definitely normal.  However, if a value is deemed "high" by the lab, it may not be too high for a pregnant woman.  We will need to discuss the results if your value(s) fall in the "high" category).     Tdap Vaccine  It is recommended that you get the Tdap vaccine during the  third trimester of EACH pregnancy to help protect your baby from getting pertussis (whooping cough)  27-36 weeks is the BEST time to do this so that you can pass the protection on to your baby. During pregnancy is better than after pregnancy, but if you are unable to get it during pregnancy it will be offered at the hospital.  You can get this vaccine at the health department or your family doctor, as well as some pharmacies.  Everyone who will be around your baby should also be up-to-date on their vaccines. Adults (who are not pregnant) only need 1 dose of Tdap during adulthood.

## 2017-02-17 ENCOUNTER — Encounter: Payer: Self-pay | Admitting: Obstetrics & Gynecology

## 2017-02-17 ENCOUNTER — Ambulatory Visit (INDEPENDENT_AMBULATORY_CARE_PROVIDER_SITE_OTHER): Payer: Managed Care, Other (non HMO) | Admitting: Obstetrics & Gynecology

## 2017-02-17 ENCOUNTER — Other Ambulatory Visit: Payer: Managed Care, Other (non HMO)

## 2017-02-17 VITALS — BP 90/60 | HR 84 | Wt 162.0 lb

## 2017-02-17 DIAGNOSIS — Z3A27 27 weeks gestation of pregnancy: Secondary | ICD-10-CM

## 2017-02-17 DIAGNOSIS — Z3482 Encounter for supervision of other normal pregnancy, second trimester: Secondary | ICD-10-CM

## 2017-02-17 DIAGNOSIS — Z331 Pregnant state, incidental: Secondary | ICD-10-CM

## 2017-02-17 DIAGNOSIS — Z131 Encounter for screening for diabetes mellitus: Secondary | ICD-10-CM

## 2017-02-17 DIAGNOSIS — Z1389 Encounter for screening for other disorder: Secondary | ICD-10-CM

## 2017-02-17 LAB — POCT URINALYSIS DIPSTICK
Blood, UA: NEGATIVE
GLUCOSE UA: NEGATIVE
LEUKOCYTES UA: NEGATIVE
Nitrite, UA: NEGATIVE
PROTEIN UA: NEGATIVE

## 2017-02-17 NOTE — Progress Notes (Signed)
[id number] [redacted]w[redacted]d Estimated Date of Delivery: 05/16/17  Blood pressure 90/60, pulse 84, weight 162 lb (73.5 kg), last menstrual period 08/15/2016.   BP weight and urine results all reviewed and noted.  Please refer to the obstetrical flow sheet for the fundal height and fetal heart rate documentation:  Patient reports good fetal movement, denies any bleeding and no rupture of membranes symptoms or regular contractions. Patient is without complaints. All questions were answered.  Orders Placed This Encounter  Procedures  . POCT urinalysis dipstick    Plan:  Continued routine obstetrical care, PN2 today  Return in about 3 weeks (around 03/10/2017) for LROB.

## 2017-02-18 LAB — CBC
HEMATOCRIT: 32.2 % — AB (ref 34.0–46.6)
HEMOGLOBIN: 11 g/dL — AB (ref 11.1–15.9)
MCH: 31.6 pg (ref 26.6–33.0)
MCHC: 34.2 g/dL (ref 31.5–35.7)
MCV: 93 fL (ref 79–97)
Platelets: 241 10*3/uL (ref 150–379)
RBC: 3.48 x10E6/uL — AB (ref 3.77–5.28)
RDW: 13.1 % (ref 12.3–15.4)
WBC: 11.4 10*3/uL — AB (ref 3.4–10.8)

## 2017-02-18 LAB — GLUCOSE TOLERANCE, 2 HOURS W/ 1HR
GLUCOSE, 2 HOUR: 91 mg/dL (ref 65–152)
Glucose, 1 hour: 120 mg/dL (ref 65–179)
Glucose, Fasting: 64 mg/dL — ABNORMAL LOW (ref 65–91)

## 2017-02-18 LAB — HIV ANTIBODY (ROUTINE TESTING W REFLEX): HIV Screen 4th Generation wRfx: NONREACTIVE

## 2017-02-18 LAB — ANTIBODY SCREEN: Antibody Screen: NEGATIVE

## 2017-02-18 LAB — RPR: RPR Ser Ql: NONREACTIVE

## 2017-03-11 ENCOUNTER — Encounter: Payer: Self-pay | Admitting: Women's Health

## 2017-03-11 ENCOUNTER — Ambulatory Visit (INDEPENDENT_AMBULATORY_CARE_PROVIDER_SITE_OTHER): Payer: Managed Care, Other (non HMO) | Admitting: Women's Health

## 2017-03-11 VITALS — BP 108/68 | HR 93 | Wt 164.0 lb

## 2017-03-11 DIAGNOSIS — Z3483 Encounter for supervision of other normal pregnancy, third trimester: Secondary | ICD-10-CM

## 2017-03-11 DIAGNOSIS — Z23 Encounter for immunization: Secondary | ICD-10-CM

## 2017-03-11 DIAGNOSIS — Z3A30 30 weeks gestation of pregnancy: Secondary | ICD-10-CM

## 2017-03-11 DIAGNOSIS — Z331 Pregnant state, incidental: Secondary | ICD-10-CM

## 2017-03-11 DIAGNOSIS — Z3482 Encounter for supervision of other normal pregnancy, second trimester: Secondary | ICD-10-CM

## 2017-03-11 DIAGNOSIS — Z1389 Encounter for screening for other disorder: Secondary | ICD-10-CM

## 2017-03-11 LAB — POCT URINALYSIS DIPSTICK
Blood, UA: NEGATIVE
GLUCOSE UA: NEGATIVE
Ketones, UA: NEGATIVE
Leukocytes, UA: NEGATIVE
Nitrite, UA: NEGATIVE
Protein, UA: NEGATIVE

## 2017-03-11 NOTE — Patient Instructions (Addendum)
Call the office [phone]) or go to [facility] if:  You begin to have strong, frequent contractions  Your water breaks.  Sometimes it is a big gush of fluid, sometimes it is just a trickle that keeps getting your panties wet or running down your legs  You have vaginal bleeding.  It is normal to have a small amount of spotting if your cervix was checked.   You don't feel your baby moving like normal.  If you don't, get you something to eat and drink and lay down and focus on feeling your baby move.  You should feel at least 10 movements in 2 hours.  If you don't, you should call the office or go to [place] your lower back pain you may:  Purchase a pregnancy belt from [employer] [place], [employer], [employer], etc and wear it while you are up and about  Take warm baths  Use a heating pad to your lower back for no longer than 20 minutes at a time, and do not place near abdomen  Take tylenol as needed. Please follow directions on the bottle  Kinesthesiology tape (can get from sporting goods store), google how to tape belly for pregnancy   Tdap Vaccine  It is recommended that you get the Tdap vaccine during the third trimester of EACH pregnancy to help protect your baby from getting pertussis (whooping cough)  27-36 weeks is the BEST time to do this so that you can pass the protection on to your baby. During pregnancy is better than after pregnancy, but if you are unable to get it during pregnancy it will be offered at the hospital.   You can get this vaccine at the health department or your family doctor  Everyone who will be around your baby should also be up-to-date on their vaccines. Adults (who are not pregnant) only need 1 dose of Tdap during adulthood.   [place] Pediatricians/Family Doctors:  [facility] [phone]                 [place] [phone] (usually not  accepting new patients unless you have family there already, you are always welcome to call and ask)       [facility] Department [phone]       [facility] Pediatricians/Family Doctors:   [facility]: [phone]  [facility]: [phone]    Third Trimester of Pregnancy The third trimester is from week 29 through week 42, months 7 through 9. The third trimester is a time when the fetus is growing rapidly. At the end of the ninth month, the fetus is about 20 inches in length and weighs 6-10 pounds.  BODY CHANGES Your body goes through many changes during pregnancy. The changes vary from woman to woman.   Your weight will continue to increase. You can expect to gain 25-35 pounds (11-16 kg) by the end of the pregnancy.  You may begin to get stretch Sime on your hips, abdomen, and breasts.  You may urinate more often because the fetus is moving lower into your pelvis and pressing on your bladder.  You may develop or continue to have heartburn as a result of your pregnancy.  You may develop constipation because certain hormones are causing the muscles that push waste through your intestines to slow down.  You may develop hemorrhoids or swollen, bulging veins (varicose veins).  You may have pelvic pain because of the weight gain and pregnancy hormones relaxing your joints between the bones in your pelvis. Backaches may result from overexertion of the muscles supporting your posture.  You may have changes in your hair. These can include thickening of your hair, rapid growth, and changes in texture. Some women also have hair loss during or after pregnancy, or hair that feels dry or thin. Your hair will most likely return to normal after your baby is born.  Your breasts will continue to grow and be tender. A yellow discharge may leak from your breasts called colostrum.  Your belly button may stick out.  You may feel short of breath because of your  expanding uterus.  You may notice the fetus "dropping," or moving lower in your abdomen.  You may have a bloody mucus discharge. This usually occurs a few days to a week before labor begins.  Your cervix becomes thin and soft (effaced) near your due date. WHAT TO EXPECT AT YOUR PRENATAL EXAMS  You will have prenatal exams every 2 weeks until week 36. Then, you will have weekly prenatal exams. During a routine prenatal visit:  You will be weighed to make sure you and the fetus are growing normally.  Your blood pressure is taken.  Your abdomen will be measured to track your baby's growth.  The fetal heartbeat will be listened to.  Any test results from the previous visit will be discussed.  You may have a cervical check near your due date to see if you have effaced. At around 36 weeks, your caregiver will check your cervix. At the same time, your caregiver will also perform a test on the secretions of the vaginal tissue. This test is to determine if a type of bacteria, Group B streptococcus, is present. Your caregiver will explain this further. Your caregiver may ask you:  What your birth plan is.  How you are feeling.  If you are feeling the baby move.  If you have had any abnormal symptoms, such as leaking fluid, bleeding, severe headaches, or abdominal cramping.  If you have any questions. Other tests or screenings that may be performed during your third trimester include:  Blood tests that check for low iron levels (anemia).  Fetal testing to check the health, activity level, and growth of the fetus. Testing is done if you have certain medical conditions or if there are problems during the pregnancy. FALSE LABOR You may feel small, irregular contractions that eventually go away. These are called Braxton Hicks contractions, or false labor. Contractions may last for hours, days, or even weeks before true labor sets in. If contractions come at regular intervals, intensify, or  become painful, it is best to be seen by your caregiver.  SIGNS OF LABOR   Menstrual-like cramps.  Contractions that are 5 minutes apart or less.  Contractions that start on the top of the uterus and spread down to the lower abdomen and back.  A sense of increased pelvic pressure or back pain.  A watery or bloody mucus discharge that comes from the vagina. If you have any of these signs before the 37th week of pregnancy, call your caregiver right away. You need to go to the hospital to get checked immediately. HOME CARE INSTRUCTIONS   Avoid all smoking, herbs, alcohol, and unprescribed drugs. These chemicals affect the formation and growth of the baby.  Follow your caregiver's instructions regarding medicine use. There are medicines that are either safe or  unsafe to take during pregnancy.  Exercise only as directed by your caregiver. Experiencing uterine cramps is a good sign to stop exercising.  Continue to eat regular, healthy meals.  Wear a good support bra for breast tenderness.  Do not use hot tubs, steam rooms, or saunas.  Wear your seat belt at all times when driving.  Avoid raw meat, uncooked cheese, cat litter boxes, and soil used by cats. These carry germs that can cause birth defects in the baby.  Take your prenatal vitamins.  Try taking a stool softener (if your caregiver approves) if you develop constipation. Eat more high-fiber foods, such as fresh vegetables or fruit and whole grains. Drink plenty of fluids to keep your urine clear or pale yellow.  Take warm sitz baths to soothe any pain or discomfort caused by hemorrhoids. Use hemorrhoid cream if your caregiver approves.  If you develop varicose veins, wear support hose. Elevate your feet for 15 minutes, 3-4 times a day. Limit salt in your diet.  Avoid heavy lifting, wear low heal shoes, and practice good posture.  Rest a lot with your legs elevated if you have leg cramps or low back pain.  Visit your  dentist if you have not gone during your pregnancy. Use a soft toothbrush to brush your teeth and be gentle when you floss.  A sexual relationship may be continued unless your caregiver directs you otherwise.  Do not travel far distances unless it is absolutely necessary and only with the approval of your caregiver.  Take prenatal classes to understand, practice, and ask questions about the labor and delivery.  Make a trial run to the hospital.  Pack your hospital bag.  Prepare the baby's nursery.  Continue to go to all your prenatal visits as directed by your caregiver. SEEK MEDICAL CARE IF:  You are unsure if you are in labor or if your water has broken.  You have dizziness.  You have mild pelvic cramps, pelvic pressure, or nagging pain in your abdominal area.  You have persistent nausea, vomiting, or diarrhea.  You have a bad smelling vaginal discharge.  You have pain with urination. SEEK IMMEDIATE MEDICAL CARE IF:   You have a fever.  You are leaking fluid from your vagina.  You have spotting or bleeding from your vagina.  You have severe abdominal cramping or pain.  You have rapid weight loss or gain.  You have shortness of breath with chest pain.  You notice sudden or extreme swelling of your face, hands, ankles, feet, or legs.  You have not felt your baby move in over an hour.  You have severe headaches that do not go away with medicine.  You have vision changes. Document Released: 05/06/2001 Document Revised: 05/17/2013 Document Reviewed: 07/13/2012 [facility] [place] Patient Information 2015 [place], [place]. This information is not intended to replace advice given to you by your health care provider. Make sure you discuss any questions you have with your health care provider.

## 2017-03-11 NOTE — Progress Notes (Signed)
   LOW-RISK PREGNANCY VISIT Patient name: Gina Alvarez MRN [id number]  Date of birth: 1990-10-27 Chief Complaint:   Routine Prenatal Visit  History of Present Illness:   Gina Alvarez is a 26 y.o. G65P1011 female at [redacted]w[redacted]d with an Estimated Date of Delivery: 05/16/17 being seen today for ongoing management of a low-risk pregnancy.  Today she reports low back pain and some upper mid back pain after working. Contractions: Not present. Vag. Bleeding: None.  Movement: Present. denies leaking of fluid. Review of Systems:   Pertinent items are noted in HPI Denies abnormal vaginal discharge w/ itching/odor/irritation, headaches, visual changes, shortness of breath, chest pain, abdominal pain, severe nausea/vomiting, or problems with urination or bowel movements unless otherwise stated above. Pertinent History Reviewed:  Reviewed past medical,surgical, social, obstetrical and family history.  Reviewed problem list, medications and allergies. Physical Assessment:   Vitals:   03/11/17 1030  BP: 108/68  Pulse: 93  Weight: 164 lb (74.4 kg)  Body mass index is 32.03 kg/m.        Physical Examination:   General appearance: Well appearing, and in no distress  Mental status: Alert, oriented to person, place, and time  Skin: Warm & dry  Cardiovascular: Normal heart rate noted  Respiratory: Normal respiratory effort, no distress  Abdomen: Soft, gravid, nontender  Pelvic: Cervical exam deferred   Extremities: Edema: None  Fetal Status: Fetal Heart Rate (bpm): 130 Fundal Height: 31 cm Movement: Present    Results for orders placed or performed in visit on 03/11/17 (from the past 24 hour(s))  POCT urinalysis dipstick   Collection Time: 03/11/17 10:31 AM  Result Value Ref Range   Color, UA     Clarity, UA     Glucose, UA neg    Bilirubin, UA     Ketones, UA neg    Spec Grav, UA  1.010 - 1.025   Blood, UA neg    pH, UA  5.0 - 8.0   Protein, UA neg    Urobilinogen, UA  0.2 or 1.0 E.U./dL     Nitrite, UA neg    Leukocytes, UA Negative Negative    Assessment & Plan:  1) Low-risk pregnancy G3P1011 at [redacted]w[redacted]d with an Estimated Date of Delivery: 05/16/17   2) Low & mid upper back pain after working, discussed using proper body mechanics/posture, gave printed relief info   Labs/procedures today: flu shot  Plan:  Continue routine obstetrical care   Reviewed: normal pn2 results, Preterm labor symptoms and general obstetric precautions including but not limited to vaginal bleeding, contractions, leaking of fluid and fetal movement were reviewed in detail with the patient.  Recommended Tdap at HD/PCP per CDC guidelines. All questions were answered  Follow-up: Return in about 2 weeks (around 03/25/2017) for [place].  Orders Placed This Encounter  Procedures  . POCT urinalysis dipstick   Tawnya Crook CNM, [facility] 03/11/2017 10:50 AM

## 2017-03-27 ENCOUNTER — Ambulatory Visit (INDEPENDENT_AMBULATORY_CARE_PROVIDER_SITE_OTHER): Payer: Managed Care, Other (non HMO) | Admitting: Women's Health

## 2017-03-27 ENCOUNTER — Encounter: Payer: Self-pay | Admitting: Women's Health

## 2017-03-27 VITALS — BP 90/50 | HR 97 | Wt 164.0 lb

## 2017-03-27 DIAGNOSIS — Z331 Pregnant state, incidental: Secondary | ICD-10-CM

## 2017-03-27 DIAGNOSIS — Z3483 Encounter for supervision of other normal pregnancy, third trimester: Secondary | ICD-10-CM

## 2017-03-27 DIAGNOSIS — Z1389 Encounter for screening for other disorder: Secondary | ICD-10-CM

## 2017-03-27 DIAGNOSIS — Z3A32 32 weeks gestation of pregnancy: Secondary | ICD-10-CM

## 2017-03-27 LAB — POCT URINALYSIS DIPSTICK
GLUCOSE UA: NEGATIVE
Leukocytes, UA: NEGATIVE
NITRITE UA: NEGATIVE
Protein, UA: NEGATIVE
RBC UA: NEGATIVE

## 2017-03-27 NOTE — Progress Notes (Signed)
   LOW-RISK PREGNANCY VISIT Patient name: Gina Alvarez MRN [id number]  Date of birth: April 21, 1991 Chief Complaint:   Routine Prenatal Visit  History of Present Illness:   Gina Alvarez is a 26 y.o. G105P1011 female at [redacted]w[redacted]d with an Estimated Date of Delivery: 05/16/17 being seen today for ongoing management of a low-risk pregnancy.  Today she reports no complaints. Contractions: Not present.  .  Movement: Present. denies leaking of fluid. Review of Systems:   Pertinent items are noted in HPI Denies abnormal vaginal discharge w/ itching/odor/irritation, headaches, visual changes, shortness of breath, chest pain, abdominal pain, severe nausea/vomiting, or problems with urination or bowel movements unless otherwise stated above. Pertinent History Reviewed:  Reviewed past medical,surgical, social, obstetrical and family history.  Reviewed problem list, medications and allergies. Physical Assessment:   Vitals:   03/27/17 0943  BP: (!) 90/50  Pulse: 97  Weight: 164 lb (74.4 kg)  Body mass index is 32.03 kg/m.        Physical Examination:   General appearance: Well appearing, and in no distress  Mental status: Alert, oriented to person, place, and time  Skin: Warm & dry  Cardiovascular: Normal heart rate noted  Respiratory: Normal respiratory effort, no distress  Abdomen: Soft, gravid, nontender  Pelvic: Cervical exam deferred         Extremities: Edema: None  Fetal Status: Fetal Heart Rate (bpm): 141 Fundal Height: 32 cm Movement: Present    Results for orders placed or performed in visit on 03/27/17 (from the past 24 hour(s))  POCT urinalysis dipstick   Collection Time: 03/27/17  9:46 AM  Result Value Ref Range   Color, UA     Clarity, UA     Glucose, UA neg    Bilirubin, UA     Ketones, UA moderate    Spec Grav, UA  1.010 - 1.025   Blood, UA neg    pH, UA  5.0 - 8.0   Protein, UA neg    Urobilinogen, UA  0.2 or 1.0 E.U./dL   Nitrite, UA neg    Leukocytes, UA Negative  Negative    Assessment & Plan:  1) Low-risk pregnancy G3P1011 at [redacted]w[redacted]d with an Estimated Date of Delivery: 05/16/17    Labs/procedures today: none  Plan:  Continue routine obstetrical care   Reviewed: Preterm labor symptoms and general obstetric precautions including but not limited to vaginal bleeding, contractions, leaking of fluid and fetal movement were reviewed in detail with the patient.  All questions were answered  Follow-up: Return in about 2 weeks (around 04/10/2017) for [place].  Orders Placed This Encounter  Procedures  . POCT urinalysis dipstick   Tawnya Crook CNM, [facility], [facility], [facility] 03/27/2017 10:28 AM

## 2017-03-27 NOTE — Patient Instructions (Signed)
Gina Alvarez, I greatly value your feedback.  If you receive a survey following your visit with [place] today, we appreciate you taking the time to fill it out.  Thanks, Knute Neu, CNM, WHNP-BC   Call the office [phone]) or go to [facility] if:  You begin to have strong, frequent contractions  Your water breaks.  Sometimes it is a big gush of fluid, sometimes it is just a trickle that keeps getting your panties wet or running down your legs  You have vaginal bleeding.  It is normal to have a small amount of spotting if your cervix was checked.   You don't feel your baby moving like normal.  If you don't, get you something to eat and drink and lay down and focus on feeling your baby move.  You should feel at least 10 movements in 2 hours.  If you don't, you should call the office or go to [facility].    Tdap Vaccine  It is recommended that you get the Tdap vaccine during the third trimester of EACH pregnancy to help protect your baby from getting pertussis (whooping cough)  27-36 weeks is the BEST time to do this so that you can pass the protection on to your baby. During pregnancy is better than after pregnancy, but if you are unable to get it during pregnancy it will be offered at the hospital.   You can get this vaccine at the health department or your family doctor  Everyone who will be around your baby should also be up-to-date on their vaccines. Adults (who are not pregnant) only need 1 dose of Tdap during adulthood.   Third Trimester of Pregnancy The third trimester is from week 29 through week 42, months 7 through 9. The third trimester is a time when the fetus is growing rapidly. At the end of the ninth month, the fetus is about 20 inches in length and weighs 6-10 pounds.  BODY CHANGES Your body goes through many changes during pregnancy. The changes vary from woman to woman.   Your weight will continue to increase. You can expect to gain 25-35 pounds (11-16 kg) by  the end of the pregnancy.  You may begin to get stretch Balis on your hips, abdomen, and breasts.  You may urinate more often because the fetus is moving lower into your pelvis and pressing on your bladder.  You may develop or continue to have heartburn as a result of your pregnancy.  You may develop constipation because certain hormones are causing the muscles that push waste through your intestines to slow down.  You may develop hemorrhoids or swollen, bulging veins (varicose veins).  You may have pelvic pain because of the weight gain and pregnancy hormones relaxing your joints between the bones in your pelvis. Backaches may result from overexertion of the muscles supporting your posture.  You may have changes in your hair. These can include thickening of your hair, rapid growth, and changes in texture. Some women also have hair loss during or after pregnancy, or hair that feels dry or thin. Your hair will most likely return to normal after your baby is born.  Your breasts will continue to grow and be tender. A yellow discharge may leak from your breasts called colostrum.  Your belly button may stick out.  You may feel short of breath because of your expanding uterus.  You may notice the fetus "dropping," or moving lower in your abdomen.  You may have a bloody  mucus discharge. This usually occurs a few days to a week before labor begins.  Your cervix becomes thin and soft (effaced) near your due date. WHAT TO EXPECT AT YOUR PRENATAL EXAMS  You will have prenatal exams every 2 weeks until week 36. Then, you will have weekly prenatal exams. During a routine prenatal visit:  You will be weighed to make sure you and the fetus are growing normally.  Your blood pressure is taken.  Your abdomen will be measured to track your baby's growth.  The fetal heartbeat will be listened to.  Any test results from the previous visit will be discussed.  You may have a cervical check near your  due date to see if you have effaced. At around 36 weeks, your caregiver will check your cervix. At the same time, your caregiver will also perform a test on the secretions of the vaginal tissue. This test is to determine if a type of bacteria, Group B streptococcus, is present. Your caregiver will explain this further. Your caregiver may ask you:  What your birth plan is.  How you are feeling.  If you are feeling the baby move.  If you have had any abnormal symptoms, such as leaking fluid, bleeding, severe headaches, or abdominal cramping.  If you have any questions. Other tests or screenings that may be performed during your third trimester include:  Blood tests that check for low iron levels (anemia).  Fetal testing to check the health, activity level, and growth of the fetus. Testing is done if you have certain medical conditions or if there are problems during the pregnancy. FALSE LABOR You may feel small, irregular contractions that eventually go away. These are called Braxton Hicks contractions, or false labor. Contractions may last for hours, days, or even weeks before true labor sets in. If contractions come at regular intervals, intensify, or become painful, it is best to be seen by your caregiver.  SIGNS OF LABOR   Menstrual-like cramps.  Contractions that are 5 minutes apart or less.  Contractions that start on the top of the uterus and spread down to the lower abdomen and back.  A sense of increased pelvic pressure or back pain.  A watery or bloody mucus discharge that comes from the vagina. If you have any of these signs before the 37th week of pregnancy, call your caregiver right away. You need to go to the hospital to get checked immediately. HOME CARE INSTRUCTIONS   Avoid all smoking, herbs, alcohol, and unprescribed drugs. These chemicals affect the formation and growth of the baby.  Follow your caregiver's instructions regarding medicine use. There are medicines  that are either safe or unsafe to take during pregnancy.  Exercise only as directed by your caregiver. Experiencing uterine cramps is a good sign to stop exercising.  Continue to eat regular, healthy meals.  Wear a good support bra for breast tenderness.  Do not use hot tubs, steam rooms, or saunas.  Wear your seat belt at all times when driving.  Avoid raw meat, uncooked cheese, cat litter boxes, and soil used by cats. These carry germs that can cause birth defects in the baby.  Take your prenatal vitamins.  Try taking a stool softener (if your caregiver approves) if you develop constipation. Eat more high-fiber foods, such as fresh vegetables or fruit and whole grains. Drink plenty of fluids to keep your urine clear or pale yellow.  Take warm sitz baths to soothe any pain or discomfort caused by hemorrhoids.  Use hemorrhoid cream if your caregiver approves.  If you develop varicose veins, wear support hose. Elevate your feet for 15 minutes, 3-4 times a day. Limit salt in your diet.  Avoid heavy lifting, wear low heal shoes, and practice good posture.  Rest a lot with your legs elevated if you have leg cramps or low back pain.  Visit your dentist if you have not gone during your pregnancy. Use a soft toothbrush to brush your teeth and be gentle when you floss.  A sexual relationship may be continued unless your caregiver directs you otherwise.  Do not travel far distances unless it is absolutely necessary and only with the approval of your caregiver.  Take prenatal classes to understand, practice, and ask questions about the labor and delivery.  Make a trial run to the hospital.  Pack your hospital bag.  Prepare the baby's nursery.  Continue to go to all your prenatal visits as directed by your caregiver. SEEK MEDICAL CARE IF:  You are unsure if you are in labor or if your water has broken.  You have dizziness.  You have mild pelvic cramps, pelvic pressure, or nagging  pain in your abdominal area.  You have persistent nausea, vomiting, or diarrhea.  You have a bad smelling vaginal discharge.  You have pain with urination. SEEK IMMEDIATE MEDICAL CARE IF:   You have a fever.  You are leaking fluid from your vagina.  You have spotting or bleeding from your vagina.  You have severe abdominal cramping or pain.  You have rapid weight loss or gain.  You have shortness of breath with chest pain.  You notice sudden or extreme swelling of your face, hands, ankles, feet, or legs.  You have not felt your baby move in over an hour.  You have severe headaches that do not go away with medicine.  You have vision changes. Document Released: 05/06/2001 Document Revised: 05/17/2013 Document Reviewed: 07/13/2012 [employer] Patient Information 2015 [employer]. This information is not intended to replace advice given to you by your health care provider. Make sure you discuss any questions you have with your health care provider.   

## 2017-04-10 ENCOUNTER — Encounter: Payer: Managed Care, Other (non HMO) | Admitting: Women's Health

## 2017-04-22 ENCOUNTER — Encounter: Payer: Self-pay | Admitting: Women's Health

## 2017-04-22 ENCOUNTER — Ambulatory Visit (INDEPENDENT_AMBULATORY_CARE_PROVIDER_SITE_OTHER): Payer: Managed Care, Other (non HMO) | Admitting: Women's Health

## 2017-04-22 VITALS — BP 110/60 | HR 98 | Wt 165.0 lb

## 2017-04-22 DIAGNOSIS — Z3483 Encounter for supervision of other normal pregnancy, third trimester: Secondary | ICD-10-CM

## 2017-04-22 DIAGNOSIS — Z1389 Encounter for screening for other disorder: Secondary | ICD-10-CM

## 2017-04-22 DIAGNOSIS — Z331 Pregnant state, incidental: Secondary | ICD-10-CM

## 2017-04-22 DIAGNOSIS — Z3A36 36 weeks gestation of pregnancy: Secondary | ICD-10-CM

## 2017-04-22 LAB — POCT URINALYSIS DIPSTICK
Blood, UA: NEGATIVE
Glucose, UA: NEGATIVE
Ketones, UA: NEGATIVE
LEUKOCYTES UA: NEGATIVE
NITRITE UA: NEGATIVE
PROTEIN UA: NEGATIVE

## 2017-04-22 NOTE — Progress Notes (Signed)
   LOW-RISK PREGNANCY VISIT Patient name: Gina Alvarez MRN [id number]  Date of birth: 1990-08-26 Chief Complaint:   Routine Prenatal Visit (GBS- GC- CHL)  History of Present Illness:   Gina Alvarez is a 26 y.o. G59P1011 female at [redacted]w[redacted]d with an Estimated Date of Delivery: 05/16/17 being seen today for ongoing management of a low-risk pregnancy.  Today she reports requests note to begin maternity leave 05/11/17-note given. Contractions: Not present.  .  Movement: Present. denies leaking of fluid. Review of Systems:   Pertinent items are noted in HPI Denies abnormal vaginal discharge w/ itching/odor/irritation, headaches, visual changes, shortness of breath, chest pain, abdominal pain, severe nausea/vomiting, or problems with urination or bowel movements unless otherwise stated above. Pertinent History Reviewed:  Reviewed past medical,surgical, social, obstetrical and family history.  Reviewed problem list, medications and allergies. Physical Assessment:   Vitals:   04/22/17 1011  BP: 110/60  Pulse: 98  Weight: 165 lb (74.8 kg)  Body mass index is 32.22 kg/m.        Physical Examination:   General appearance: Well appearing, and in no distress  Mental status: Alert, oriented to person, place, and time  Skin: Warm & dry  Cardiovascular: Normal heart rate noted  Respiratory: Normal respiratory effort, no distress  Abdomen: Soft, gravid, nontender  Pelvic: Cervical exam performed  Dilation: 3 Effacement (%): 50 Station: -2, tight 3cm  Extremities: Edema: Trace  Fetal Status: Fetal Heart Rate (bpm): 157 Fundal Height: 34 cm Movement: Present Presentation: Vertex  Results for orders placed or performed in visit on 04/22/17 (from the past 24 hour(s))  POCT urinalysis dipstick   Collection Time: 04/22/17 10:14 AM  Result Value Ref Range   Color, UA     Clarity, UA     Glucose, UA neg    Bilirubin, UA     Ketones, UA neg    Spec Grav, UA  1.010 - 1.025   Blood, UA neg    pH,  UA  5.0 - 8.0   Protein, UA neg    Urobilinogen, UA  0.2 or 1.0 E.U./dL   Nitrite, UA neg    Leukocytes, UA Negative Negative    Assessment & Plan:  1) Low-risk pregnancy G3P1011 at [redacted]w[redacted]d with an Estimated Date of Delivery: 05/16/17    Labs/procedures today: gbs, gc/ct, sve  Plan:  Continue routine obstetrical care   Reviewed: Preterm labor symptoms and general obstetric precautions including but not limited to vaginal bleeding, contractions, leaking of fluid and fetal movement were reviewed in detail with the patient.  All questions were answered  Follow-up: Return in about 1 week (around 04/29/2017) for [place].  Orders Placed This Encounter  Procedures  . GC/Chlamydia Probe Amp  . Strep Gp B NAA  . POCT urinalysis dipstick   Tawnya Crook CNM, [facility] 04/22/2017 10:33 AM

## 2017-04-22 NOTE — Patient Instructions (Signed)
Gina Alvarez, I greatly value your feedback.  If you receive a survey following your visit with [place] today, we appreciate you taking the time to fill it out.  Thanks, Knute Neu, CNM, WHNP-BC   Call the office [phone]) or go to [facility] if:  You begin to have strong, frequent contractions  Your water breaks.  Sometimes it is a big gush of fluid, sometimes it is just a trickle that keeps getting your panties wet or running down your legs  You have vaginal bleeding.  It is normal to have a small amount of spotting if your cervix was checked.   You don't feel your baby moving like normal.  If you don't, get you something to eat and drink and lay down and focus on feeling your baby move.  You should feel at least 10 movements in 2 hours.  If you don't, you should call the office or go to [place] Contractions Contractions of the uterus can occur throughout pregnancy, but they are not always a sign that you are in labor. You may have practice contractions called Braxton Hicks contractions. These false labor contractions are sometimes confused with true labor. What are [place] contractions? Braxton Hicks contractions are tightening movements that occur in the muscles of the uterus before labor. Unlike true labor contractions, these contractions do not result in opening (dilation) and thinning of the cervix. Toward the end of pregnancy (32-34 weeks), Braxton Hicks contractions can happen more often and may become stronger. These contractions are sometimes difficult to tell apart from true labor because they can be very uncomfortable. You should not feel embarrassed if you go to the hospital with false labor. Sometimes, the only way to tell if you are in true labor is for your health care provider to look for changes in the cervix. The health care provider will do a physical exam and may monitor your contractions. If you are not in true labor, the exam should  show that your cervix is not dilating and your water has not broken. If there are no prenatal problems or other health problems associated with your pregnancy, it is completely safe for you to be sent home with false labor. You may continue to have Braxton Hicks contractions until you go into true labor. How can I tell the difference between true labor and false labor?  Differences ? False labor ? Contractions last 30-70 seconds.: Contractions are usually shorter and not as strong as true labor contractions. ? Contractions become very regular.: Contractions are usually irregular. ? Discomfort is usually felt in the top of the uterus, and it spreads to the lower abdomen and low back.: Contractions are often felt in the front of the lower abdomen and in the groin. ? Contractions do not go away with walking.: Contractions may go away when you walk around or change positions while lying down. ? Contractions usually become more intense and increase in frequency.: Contractions get weaker and are shorter-lasting as time goes on. ? The cervix dilates and gets thinner.: The cervix usually does not dilate or become thin. Follow these instructions at home:  Take over-the-counter and prescription medicines only as told by your health care provider.  Keep up with your usual exercises and follow other instructions from your health care provider.  Eat and drink lightly if you think you are going into labor.  If Braxton Hicks contractions are making you uncomfortable: ? Change your position from lying  down or resting to walking, or change from walking to resting. ? Sit and rest in a tub of warm water. ? Drink enough fluid to keep your urine clear or pale yellow. Dehydration may cause these contractions. ? Do slow and deep breathing several times an hour.  Keep all follow-up prenatal visits as told by your health care provider. This is important. Contact a health care provider if:  You have a  fever.  You have continuous pain in your abdomen. Get help right away if:  Your contractions become stronger, more regular, and closer together.  You have fluid leaking or gushing from your vagina.  You pass blood-tinged mucus (bloody show).  You have bleeding from your vagina.  You have low back pain that you never had before.  You feel your baby's head pushing down and causing pelvic pressure.  Your baby is not moving inside you as much as it used to. Summary  Contractions that occur before labor are called Braxton Hicks contractions, false labor, or practice contractions.  Braxton Hicks contractions are usually shorter, weaker, farther apart, and less regular than true labor contractions. True labor contractions usually become progressively stronger and regular and they become more frequent.  Manage discomfort from [facility] contractions by changing position, resting in a warm bath, drinking plenty of water, or practicing deep breathing. This information is not intended to replace advice given to you by your health care provider. Make sure you discuss any questions you have with your health care provider. Document Released: 05/12/2005 Document Revised: 03/31/2016 Document Reviewed: 03/31/2016 Elsevier Interactive Patient Education  2017 [employer].

## 2017-04-23 LAB — OB RESULTS CONSOLE GBS: STREP GROUP B AG: NEGATIVE

## 2017-04-24 LAB — GC/CHLAMYDIA PROBE AMP
Chlamydia trachomatis, NAA: NEGATIVE
Neisseria gonorrhoeae by PCR: NEGATIVE

## 2017-04-24 LAB — STREP GP B NAA: Strep Gp B NAA: NEGATIVE

## 2017-04-29 ENCOUNTER — Encounter: Payer: Self-pay | Admitting: Obstetrics and Gynecology

## 2017-04-29 ENCOUNTER — Ambulatory Visit (INDEPENDENT_AMBULATORY_CARE_PROVIDER_SITE_OTHER): Payer: Managed Care, Other (non HMO) | Admitting: Obstetrics and Gynecology

## 2017-04-29 VITALS — BP 100/64 | HR 102 | Wt 165.8 lb

## 2017-04-29 DIAGNOSIS — Z3483 Encounter for supervision of other normal pregnancy, third trimester: Secondary | ICD-10-CM

## 2017-04-29 DIAGNOSIS — Z1389 Encounter for screening for other disorder: Secondary | ICD-10-CM

## 2017-04-29 DIAGNOSIS — Z331 Pregnant state, incidental: Secondary | ICD-10-CM

## 2017-04-29 DIAGNOSIS — Z3A37 37 weeks gestation of pregnancy: Secondary | ICD-10-CM

## 2017-04-29 LAB — POCT URINALYSIS DIPSTICK
Blood, UA: NEGATIVE
GLUCOSE UA: NEGATIVE
KETONES UA: NEGATIVE
Leukocytes, UA: NEGATIVE
Nitrite, UA: NEGATIVE
Protein, UA: NEGATIVE

## 2017-04-29 NOTE — Progress Notes (Signed)
Patient ID: Gina Alvarez, female   DOB: 1991/02/17, 26 y.o.   MRN: [id number]  Estimated Date of Delivery: 05/16/17 [facility] [redacted]w[redacted]d  Chief Complaint  Patient presents with   Routine Prenatal Visit  ____  Patient complaints: She reports today for her routine prenatal visit. Patient reports good fetal movement, denies any bleeding, rupture of membranes,or regular contractions.  Blood pressure 100/64, pulse (!) 102, weight 165 lb 12.8 oz (75.2 kg), last menstrual period 08/15/2016.   Urine results:notable for negative refer to the ob flow sheet for FH and FHR, ,                          Physical Examination: General appearance - alert, well appearing, and in no distress and oriented to person, place, and time                                      Abdomen - FH 37 cm,                                                         -FHR 124                                      Pelvic - cervix: 250 -2                                            Questions were answered. Assessment: LROB G3P1011 @ [redacted]w[redacted]d   Plan:  Continued routine obstetrical care,   F/u in 1 weeks for LROB  By signing my name below, I, Margit Banda, attest that this documentation has been prepared under the direction and in the presence of Jonnie Kind, MD. Electronically Signed: Margit Banda, Medical Scribe. 04/29/17. 11:27 AM.  I personally performed the services described in this documentation, which was SCRIBED in my presence. The recorded information has been reviewed and considered accurate. It has been edited as necessary during review. Jonnie Kind, MD

## 2017-05-07 ENCOUNTER — Encounter: Payer: Self-pay | Admitting: Obstetrics & Gynecology

## 2017-05-07 ENCOUNTER — Ambulatory Visit (INDEPENDENT_AMBULATORY_CARE_PROVIDER_SITE_OTHER): Payer: Managed Care, Other (non HMO) | Admitting: Obstetrics & Gynecology

## 2017-05-07 VITALS — BP 98/52 | HR 93 | Wt 170.0 lb

## 2017-05-07 DIAGNOSIS — Z331 Pregnant state, incidental: Secondary | ICD-10-CM

## 2017-05-07 DIAGNOSIS — Z3483 Encounter for supervision of other normal pregnancy, third trimester: Secondary | ICD-10-CM

## 2017-05-07 DIAGNOSIS — Z3A38 38 weeks gestation of pregnancy: Secondary | ICD-10-CM

## 2017-05-07 DIAGNOSIS — Z1389 Encounter for screening for other disorder: Secondary | ICD-10-CM

## 2017-05-07 LAB — POCT URINALYSIS DIPSTICK
Glucose, UA: NEGATIVE
Protein, UA: NEGATIVE

## 2017-05-07 NOTE — Progress Notes (Signed)
[id number] [redacted]w[redacted]d Estimated Date of Delivery: 05/16/17  Blood pressure (!) 98/52, pulse 93, weight 170 lb (77.1 kg), last menstrual period 08/15/2016.   BP weight and urine results all reviewed and noted.  Please refer to the obstetrical flow sheet for the fundal height and fetal heart rate documentation:  Patient reports good fetal movement, denies any bleeding and no rupture of membranes symptoms or regular contractions. Patient is without complaints. All questions were answered.  Orders Placed This Encounter  Procedures  . POCT Urinalysis Dipstick    Plan:  Continued routine obstetrical care,   Return in about 1 week (around 05/14/2017) for LROB.

## 2017-05-14 ENCOUNTER — Ambulatory Visit (INDEPENDENT_AMBULATORY_CARE_PROVIDER_SITE_OTHER): Payer: Managed Care, Other (non HMO) | Admitting: Advanced Practice Midwife

## 2017-05-14 ENCOUNTER — Encounter: Payer: Self-pay | Admitting: Advanced Practice Midwife

## 2017-05-14 VITALS — BP 110/70 | HR 97 | Wt 168.0 lb

## 2017-05-14 DIAGNOSIS — Z3A39 39 weeks gestation of pregnancy: Secondary | ICD-10-CM | POA: Diagnosis not present

## 2017-05-14 DIAGNOSIS — Z331 Pregnant state, incidental: Secondary | ICD-10-CM

## 2017-05-14 DIAGNOSIS — Z3483 Encounter for supervision of other normal pregnancy, third trimester: Secondary | ICD-10-CM

## 2017-05-14 DIAGNOSIS — O480 Post-term pregnancy: Secondary | ICD-10-CM

## 2017-05-14 DIAGNOSIS — Z1389 Encounter for screening for other disorder: Secondary | ICD-10-CM

## 2017-05-14 LAB — POCT URINALYSIS DIPSTICK
Glucose, UA: NEGATIVE
Ketones, UA: NEGATIVE
Leukocytes, UA: NEGATIVE
Nitrite, UA: NEGATIVE
Protein, UA: NEGATIVE
RBC UA: NEGATIVE

## 2017-05-14 NOTE — Progress Notes (Signed)
[id number] [redacted]w[redacted]d Estimated Date of Delivery: 05/16/17  Blood pressure 110/70, pulse 97, weight 168 lb (76.2 kg), last menstrual period 08/15/2016.   BP weight and urine results all reviewed and noted.  Please refer to the obstetrical flow sheet for the fundal height and fetal heart rate documentation:  Patient reports good fetal movement, denies any bleeding and no rupture of membranes symptoms or regular contractions. Patient is without complaints. All questions were answered.  Orders Placed This Encounter  Procedures  . POCT urinalysis dipstick    Plan:  Continued routine obstetrical care, membranes stripped  Return in about 6 days (around 05/20/2017) for LROB, NST.

## 2017-05-15 ENCOUNTER — Inpatient Hospital Stay (HOSPITAL_COMMUNITY): Payer: Managed Care, Other (non HMO) | Admitting: Anesthesiology

## 2017-05-15 ENCOUNTER — Inpatient Hospital Stay (HOSPITAL_COMMUNITY)
Admission: AD | Admit: 2017-05-15 | Discharge: 2017-05-17 | DRG: 807 | Disposition: A | Discharge status: Home / Self Care | Payer: Managed Care, Other (non HMO) | Source: Ambulatory Visit | Attending: Obstetrics and Gynecology | Admitting: Obstetrics and Gynecology

## 2017-05-15 ENCOUNTER — Encounter (HOSPITAL_COMMUNITY): Payer: Self-pay | Admitting: *Deleted

## 2017-05-15 DIAGNOSIS — Z87891 Personal history of nicotine dependence: Secondary | ICD-10-CM | POA: Diagnosis not present

## 2017-05-15 DIAGNOSIS — Z3483 Encounter for supervision of other normal pregnancy, third trimester: Secondary | ICD-10-CM

## 2017-05-15 DIAGNOSIS — Z3A39 39 weeks gestation of pregnancy: Secondary | ICD-10-CM

## 2017-05-15 HISTORY — DX: Other specified health status: Z78.9

## 2017-05-15 LAB — CBC
HEMATOCRIT: 32.3 % — AB (ref 36.0–46.0)
Hemoglobin: 11.2 g/dL — ABNORMAL LOW (ref 12.0–15.0)
MCH: 32.2 pg (ref 26.0–34.0)
MCHC: 34.7 g/dL (ref 30.0–36.0)
MCV: 92.8 fL (ref 78.0–100.0)
PLATELETS: 230 10*3/uL (ref 150–400)
RBC: 3.48 MIL/uL — ABNORMAL LOW (ref 3.87–5.11)
RDW: 12.7 % (ref 11.5–15.5)
WBC: 15.7 10*3/uL — ABNORMAL HIGH (ref 4.0–10.5)

## 2017-05-15 LAB — TYPE AND SCREEN
ABO/RH(D): A POS
Antibody Screen: NEGATIVE

## 2017-05-15 LAB — RPR: RPR Ser Ql: NONREACTIVE

## 2017-05-15 LAB — ABO/RH: ABO/RH(D): A POS

## 2017-05-15 MED ORDER — LACTATED RINGERS IV SOLN: 500.0000 mL | Freq: Once | INTRAVENOUS | Status: DC

## 2017-05-15 MED ORDER — LIDOCAINE HCL (PF) 1 % IJ SOLN
INTRAMUSCULAR | Status: DC | PRN
  Administered 2017-05-15: 4 mL via EPIDURAL
  Administered 2017-05-15: 3 mL via EPIDURAL

## 2017-05-15 MED ORDER — DIPHENHYDRAMINE HCL 25 MG PO CAPS
25.0000 mg | ORAL_CAPSULE | Freq: Four times a day (QID) | ORAL | Status: DC | PRN
  Administered 2017-05-15: 25 mg via ORAL
  Filled 2017-05-15: qty 1

## 2017-05-15 MED ORDER — ACETAMINOPHEN 325 MG PO TABS: 650.0000 mg | ORAL_TABLET | ORAL | Status: DC | PRN

## 2017-05-15 MED ORDER — FENTANYL 2.5 MCG/ML BUPIVACAINE 1/10 % EPIDURAL INFUSION (WH - ANES)
14.0000 mL/h | INTRAMUSCULAR | Status: DC | PRN
  Administered 2017-05-15: 11 mL/h via EPIDURAL
  Filled 2017-05-15: qty 100

## 2017-05-15 MED ORDER — ONDANSETRON HCL 4 MG PO TABS: 4.0000 mg | ORAL_TABLET | ORAL | Status: DC | PRN

## 2017-05-15 MED ORDER — TETANUS-DIPHTH-ACELL PERTUSSIS 5-2.5-18.5 LF-MCG/0.5 IM SUSP
0.5000 mL | Freq: Once | INTRAMUSCULAR | Status: AC
  Administered 2017-05-17: 0.5 mL via INTRAMUSCULAR
  Filled 2017-05-15: qty 0.5

## 2017-05-15 MED ORDER — ONDANSETRON HCL 4 MG/2ML IJ SOLN: 4.0000 mg | INTRAMUSCULAR | Status: DC | PRN

## 2017-05-15 MED ORDER — ONDANSETRON HCL 4 MG/2ML IJ SOLN: 4.0000 mg | Freq: Four times a day (QID) | INTRAMUSCULAR | Status: DC | PRN

## 2017-05-15 MED ORDER — FLEET ENEMA 7-19 GM/118ML RE ENEM: 1.0000 | ENEMA | RECTAL | Status: DC | PRN

## 2017-05-15 MED ORDER — EPHEDRINE 5 MG/ML INJ
10.0000 mg | INTRAVENOUS | Status: DC | PRN
  Filled 2017-05-15: qty 2

## 2017-05-15 MED ORDER — HYDROXYZINE HCL 50 MG PO TABS
50.0000 mg | ORAL_TABLET | Freq: Four times a day (QID) | ORAL | Status: DC | PRN
  Filled 2017-05-15: qty 1

## 2017-05-15 MED ORDER — FENTANYL CITRATE (PF) 100 MCG/2ML IJ SOLN: 50.0000 ug | INTRAMUSCULAR | Status: DC | PRN

## 2017-05-15 MED ORDER — COCONUT OIL OIL
1.0000 "application " | TOPICAL_OIL | Status: DC | PRN
  Administered 2017-05-17: 1 via TOPICAL
  Filled 2017-05-15: qty 120

## 2017-05-15 MED ORDER — LACTATED RINGERS IV SOLN
INTRAVENOUS | Status: DC
  Administered 2017-05-15 (×2): via INTRAVENOUS

## 2017-05-15 MED ORDER — SOD CITRATE-CITRIC ACID 500-334 MG/5ML PO SOLN: 30.0000 mL | ORAL | Status: DC | PRN

## 2017-05-15 MED ORDER — BENZOCAINE-MENTHOL 20-0.5 % EX AERO
1.0000 "application " | INHALATION_SPRAY | CUTANEOUS | Status: DC | PRN
  Administered 2017-05-15: 1 via TOPICAL
  Filled 2017-05-15: qty 56

## 2017-05-15 MED ORDER — DIPHENHYDRAMINE HCL 50 MG/ML IJ SOLN: 12.5000 mg | INTRAMUSCULAR | Status: DC | PRN

## 2017-05-15 MED ORDER — LACTATED RINGERS IV SOLN
500.0000 mL | INTRAVENOUS | Status: DC | PRN
  Administered 2017-05-15: 500 mL via INTRAVENOUS

## 2017-05-15 MED ORDER — DIBUCAINE 1 % RE OINT: 1.0000 "application " | TOPICAL_OINTMENT | RECTAL | Status: DC | PRN

## 2017-05-15 MED ORDER — ZOLPIDEM TARTRATE 5 MG PO TABS: 5.0000 mg | ORAL_TABLET | Freq: Every evening | ORAL | Status: DC | PRN

## 2017-05-15 MED ORDER — PHENYLEPHRINE 40 MCG/ML (10ML) SYRINGE FOR IV PUSH (FOR BLOOD PRESSURE SUPPORT)
80.0000 ug | PREFILLED_SYRINGE | INTRAVENOUS | Status: DC | PRN
  Filled 2017-05-15: qty 5
  Filled 2017-05-15: qty 10

## 2017-05-15 MED ORDER — LIDOCAINE HCL (PF) 1 % IJ SOLN
30.0000 mL | INTRAMUSCULAR | Status: DC | PRN
  Filled 2017-05-15: qty 30

## 2017-05-15 MED ORDER — WITCH HAZEL-GLYCERIN EX PADS: 1.0000 "application " | MEDICATED_PAD | CUTANEOUS | Status: DC | PRN

## 2017-05-15 MED ORDER — PRENATAL MULTIVITAMIN CH
1.0000 | ORAL_TABLET | Freq: Every day | ORAL | Status: DC
  Administered 2017-05-16 – 2017-05-17 (×2): 1 via ORAL
  Filled 2017-05-15 (×2): qty 1

## 2017-05-15 MED ORDER — OXYCODONE-ACETAMINOPHEN 5-325 MG PO TABS: 2.0000 | ORAL_TABLET | ORAL | Status: DC | PRN

## 2017-05-15 MED ORDER — OXYTOCIN 40 UNITS IN LACTATED RINGERS INFUSION - SIMPLE MED
2.5000 [IU]/h | INTRAVENOUS | Status: DC
  Administered 2017-05-15: 2.5 [IU]/h via INTRAVENOUS
  Filled 2017-05-15: qty 1000

## 2017-05-15 MED ORDER — OXYCODONE-ACETAMINOPHEN 5-325 MG PO TABS: 1.0000 | ORAL_TABLET | ORAL | Status: DC | PRN

## 2017-05-15 MED ORDER — SENNOSIDES-DOCUSATE SODIUM 8.6-50 MG PO TABS
2.0000 | ORAL_TABLET | ORAL | Status: DC
  Administered 2017-05-15 – 2017-05-16 (×2): 2 via ORAL
  Filled 2017-05-15 (×2): qty 2

## 2017-05-15 MED ORDER — IBUPROFEN 600 MG PO TABS
600.0000 mg | ORAL_TABLET | Freq: Four times a day (QID) | ORAL | Status: DC
  Administered 2017-05-15 – 2017-05-17 (×7): 600 mg via ORAL
  Filled 2017-05-15 (×9): qty 1

## 2017-05-15 MED ORDER — SIMETHICONE 80 MG PO CHEW: 80.0000 mg | CHEWABLE_TABLET | ORAL | Status: DC | PRN

## 2017-05-15 MED ORDER — ACETAMINOPHEN 325 MG PO TABS
650.0000 mg | ORAL_TABLET | ORAL | Status: DC | PRN
  Administered 2017-05-15 – 2017-05-16 (×4): 650 mg via ORAL
  Filled 2017-05-15 (×4): qty 2

## 2017-05-15 MED ORDER — OXYTOCIN BOLUS FROM INFUSION
500.0000 mL | Freq: Once | INTRAVENOUS | Status: AC
  Administered 2017-05-15: 500 mL via INTRAVENOUS

## 2017-05-15 NOTE — Anesthesia Preprocedure Evaluation (Signed)
Anesthesia Evaluation  Patient identified by MRN, date of birth, ID band Patient awake    Reviewed: Allergy & Precautions, NPO status , Patient's Chart, lab work & pertinent test results  Airway Mallampati: II  TM Distance: >3 FB Neck ROM: Full    Dental no notable dental hx. (+) Teeth Intact   Pulmonary former smoker,    Pulmonary exam normal breath sounds clear to auscultation       Cardiovascular Normal cardiovascular exam Rhythm:Regular Rate:Normal     Neuro/Psych negative neurological ROS  negative psych ROS   GI/Hepatic negative GI ROS, Neg liver ROS,   Endo/Other  Obesity  Renal/GU negative Renal ROS  negative genitourinary   Musculoskeletal negative musculoskeletal ROS (+)   Abdominal (+) + obese,   Peds  Hematology negative hematology ROS (+)   Anesthesia Other Findings   Reproductive/Obstetrics (+) Pregnancy                             Anesthesia Physical Anesthesia Plan  ASA: II  Anesthesia Plan: Epidural   Post-op Pain Management:    Induction:   PONV Risk Score and Plan:   Airway Management Planned:   Additional Equipment:   Intra-op Plan:   Post-operative Plan:   Informed Consent: I have reviewed the patients History and Physical, chart, labs and discussed the procedure including the risks, benefits and alternatives for the proposed anesthesia with the patient or authorized representative who has indicated his/her understanding and acceptance.     Plan Discussed with: Anesthesiologist  Anesthesia Plan Comments:         Anesthesia Quick Evaluation

## 2017-05-15 NOTE — MAU Note (Signed)
Pt presents to MAU c/o ctxs every 1-16min. Pt denies LOF. Pt reports bloody show and states good FM.

## 2017-05-15 NOTE — Anesthesia Postprocedure Evaluation (Signed)
Anesthesia Post Note  Patient: Gina Alvarez  Procedure(s) Performed: AN AD HOC LABOR EPIDURAL     Patient location during evaluation: Mother Baby Anesthesia Type: Epidural Level of consciousness: awake, awake and alert, oriented and patient cooperative Pain management: pain level controlled Vital Signs Assessment: post-procedure vital signs reviewed and stable Respiratory status: spontaneous breathing, nonlabored ventilation and respiratory function stable Cardiovascular status: stable Postop Assessment: no headache, no backache, patient able to bend at knees and no apparent nausea or vomiting Anesthetic complications: no    Last Vitals:  Vitals:   05/15/17 1227 05/15/17 1333  BP: 118/63 112/61  Pulse: 70 73  Resp: 16 16  Temp: 37.2 C 37.5 C    Last Pain:  Vitals:   05/15/17 1333  TempSrc: Oral  PainSc: 0-No pain   Pain Goal: Patients Stated Pain Goal: 0 (05/15/17 0215)               Adain Geurin L

## 2017-05-15 NOTE — Lactation Note (Signed)
This note was copied from a baby's chart. Lactation Consultation Note  Patient Name: Gina Alvarez YYFRT'M Date: 05/15/2017 Reason for consult: Initial assessment   P2, Bf first child for 1.5 months.   Reviewed hand expression with drops expressed. Assisted w/ latch.  Baby has been spitty, sleepy and mouthed nipple and did not suck. Mom encouraged to feed baby 8-12 times/24 hours and with feeding cues.  Reviewed basics. Placed baby STS after attempt and reviewed feedings cues. Mom made aware of O/P services, breastfeeding support groups, community resources, and our phone # for post-discharge questions.      Maternal Data Has patient been taught Hand Expression?: Yes Does the patient have breastfeeding experience prior to this delivery?: Yes  Feeding    LATCH Score                   Interventions    Lactation Tools Discussed/Used     Consult Status Consult Status: Follow-up Date: 05/16/17 Follow-up type: In-patient    Vivianne Master [facility] 05/15/2017, 3:24 PM

## 2017-05-15 NOTE — Progress Notes (Signed)
Vitals:   05/15/17 0530 05/15/17 0542  BP: (!) 99/57 (!) 102/53  Pulse: 86 80  Resp: 16 16  Temp:     Comfortable w/epidural. AROM w/clear fluid at 0445.  Cx 9/100/0.  Ctx q 2-3 minutes  FHR Cat 1.  Anticipate SVD soon.

## 2017-05-15 NOTE — H&P (Signed)
Gina Alvarez is a 26 y.o. female G16P1011 with IUP at 41w6dpresenting for contractions. Pt states she has been having regular, every 2-4 minutes contractions, associated with none vaginal bleeding for several hours..  Membranes are intact, with active fetal movement.   [facility]  Prenatal History/Complications: none Past Medical History: Past Medical History:  Diagnosis Date  . Contraception management 08/19/2012   nexplanon inserted 08/19/12 in left arm  . Irregular menstrual bleeding 09/26/2013  . Nexplanon in place 09/26/2013  . No pertinent past medical history     Past Surgical History: Past Surgical History:  Procedure Laterality Date  . NO PAST SURGERIES      Obstetrical History: OB History    Gravida Para Term Preterm AB Living   '3 1 1   1 1   '$ SAB TAB Ectopic Multiple Live Births   1       1       Social History: Social History   Socioeconomic History  . Marital status: Single    Spouse name: None  . Number of children: None  . Years of education: None  . Highest education level: None  Social Needs  . Financial resource strain: None  . Food insecurity - worry: None  . Food insecurity - inability: None  . Transportation needs - medical: None  . Transportation needs - non-medical: None  Occupational History  . None  Tobacco Use  . Smoking status: Former Smoker    Packs/day: 0.50    Years: 8.00    Pack years: 4.00    Types: Cigarettes  . Smokeless tobacco: Never Used  . Tobacco comment: trying to lower in prep for having another baby (12/2015)  Substance and Sexual Activity  . Alcohol use: No    Comment: maybe 1 drink per month  . Drug use: No  . Sexual activity: Yes    Partners: Male    Birth control/protection: None  Other Topics Concern  . None  Social History Narrative  . None    Family History: Family History  Problem Relation Age of Onset  . Breast cancer Mother        dx [id number] BRCA2+; stage III w/ chemo and  radiation  . Asthma Daughter   . Lupus Paternal Grandmother        d. 52 . Skin cancer Maternal Grandmother        "skin discoloration on arms" - not spotty  . Mental illness Maternal Aunt     Allergies: No Known Allergies  Medications Prior to Admission  Medication Sig Dispense Refill Last Dose  . Prenat-FeCbn-FeAsp-Meth-FA-DHA (PRENATE MINI) 18-0.6-0.4-350 MG CAPS Take 1 capsule by mouth daily. 30 capsule 11 Past Week at Unknown time        Review of Systems   Constitutional: Negative for fever and chills Eyes: Negative for visual disturbances Respiratory: Negative for shortness of breath, dyspnea Cardiovascular: Negative for chest pain or palpitations  Gastrointestinal: Negative for vomiting, diarrhea and constipation.  POSITIVE for abdominal pain (contractions) Genitourinary: Negative for dysuria and urgency Musculoskeletal: Negative for back pain, joint pain, myalgias  Neurological: Negative for dizziness and headaches      Blood pressure 106/65, pulse 88, temperature 97.8 F (36.6 C), temperature source Oral, resp. rate 20, height 5' (1.524 m), weight 76.2 kg (168 lb), last menstrual period 08/15/2016. General appearance: alert, cooperative and no distress Lungs: clear to auscultation bilaterally Heart: regular rate and rhythm Abdomen: soft, non-tender; bowel  sounds normal Extremities: Homans sign is negative, no sign of DVT DTR's 2+ Presentation: cephalic Fetal monitoring  Baseline: 140 bpm, Variability: Good {> 6 bpm), Accelerations: Reactive and Decelerations: Absent Uterine activity  1-4 Dilation: 5 Effacement (%): 80 Station: -1, 0 Exam by:: lauren fields rn    Prenatal labs: ABO, Rh: A/Positive/-- (05/29 1513) Antibody: Negative (09/25 1004) Rubella: 1.30 (05/29 1513) RPR: Non Reactive (09/25 1004)  HBsAg: Negative (05/29 1513)  HIV:  meg GBS: Negative (11/28 1100)    Prenatal Transfer Tool  Maternal Diabetes: No Genetic Screening:  Normal Maternal Ultrasounds/Referrals: Normal Fetal Ultrasounds or other Referrals:  None Maternal Substance Abuse:  No Significant Maternal Medications:  None Significant Maternal Lab Results: Lab values include: Group B Strep negative     Results for orders placed or performed in visit on 05/14/17 (from the past 24 hour(s))  POCT urinalysis dipstick   Collection Time: 05/14/17 10:58 AM  Result Value Ref Range   Color, UA     Clarity, UA     Glucose, UA neg    Bilirubin, UA     Ketones, UA neg    Spec Grav, UA  1.010 - 1.025   Blood, UA neg    pH, UA  5.0 - 8.0   Protein, UA neg    Urobilinogen, UA  0.2 or 1.0 E.U./dL   Nitrite, UA neg    Leukocytes, UA Negative Negative   Appearance     Odor      [facility]  Initiated Care at  [place]  Dating By 1st trimester U/S 7wk  Pap Normal 2018  GC/CT Initial:      -/-          36+wks:  -/-  Genetic Screen NT/IT: neg  CF screen declined  Anatomic [place] Normal female 'Everly'  Flu vaccine 03/11/17  Tdap Recommended ~ 28wks  Glucose Screen  2 hr  64/120/91  GBS neg  Feed Preference breast  Contraception Nexplanon  Circumcision n/a  Childbirth Classes declined  Pediatrician List given    Assessment: Gina Alvarez is a 26 y.o. G3P1011 with an IUP at 26w6dpresenting for labor  Plan: #Labor: expectant management #Pain:  Per request #FWB Cat 1   CRESENZO-DISHMAN,Jatin Naumann 05/15/2017, 2:22 AM

## 2017-05-15 NOTE — Anesthesia Procedure Notes (Signed)
Epidural Patient location during procedure: OB Start time: 05/15/2017 3:34 AM  Staffing Anesthesiologist: Josephine Igo, MD Performed: anesthesiologist   Preanesthetic Checklist Completed: patient identified, site marked, surgical consent, pre-op evaluation, timeout performed, IV checked, risks and benefits discussed and monitors and equipment checked  Epidural Patient position: sitting Prep: site prepped and draped and DuraPrep Patient monitoring: continuous pulse ox and blood pressure Approach: midline Location: L3-L4 Injection technique: LOR air  Needle:  Needle type: Tuohy  Needle gauge: 17 G Needle length: 9 cm and 9 Needle insertion depth: 4 cm Catheter type: closed end flexible Catheter size: 19 Gauge Catheter at skin depth: 9 cm Test dose: negative and Other  Assessment Events: blood not aspirated, injection not painful, no injection resistance, negative IV test and no paresthesia  Additional Notes Patient identified. Risks and benefits discussed including failed block, incomplete  Pain control, post dural puncture headache, nerve damage, paralysis, blood pressure Changes, nausea, vomiting, reactions to medications-both toxic and allergic and post Partum back pain. All questions were answered. Patient expressed understanding and wished to proceed. Sterile technique was used throughout procedure. Epidural site was Dressed with sterile barrier dressing. No paresthesias, signs of intravascular injection Or signs of intrathecal spread were encountered.  Patient was more comfortable after the epidural was dosed. Please see RN's note for documentation of vital signs and FHR which are stable.

## 2017-05-15 NOTE — Anesthesia Pain Management Evaluation Note (Signed)
  CRNA Pain Management Visit Note  Patient: Gina Alvarez, 26 y.o., female  "Hello I am a member of the anesthesia team at [facility]. We have an anesthesia team available at all times to provide care throughout the hospital, including epidural management and anesthesia for C-section. I don't know your plan for the delivery whether it a natural birth, water birth, IV sedation, nitrous supplementation, doula or epidural, but we want to meet your pain goals."   1.Was your pain managed to your expectations on prior hospitalizations?   Yes   2.What is your expectation for pain management during this hospitalization?     Epidural  3.How can we help you reach that goal? epidural  Record the patient's initial score and the patient's pain goal.   Pain: 0  Pain Goal: 4 [facility] wants you to be able to say your pain was always managed very well.  Josie Burleigh 05/15/2017

## 2017-05-16 ENCOUNTER — Other Ambulatory Visit: Payer: Self-pay

## 2017-05-16 NOTE — Progress Notes (Signed)
POSTPARTUM PROGRESS NOTE  Post Partum Day 1 Subjective:  Gina Alvarez is a 26 y.o. [id number] [redacted]w[redacted]d s/p SVD.  No acute events overnight.  Pt denies problems with ambulating, voiding or po intake.  She denies nausea or vomiting.  Pain is well controlled. Lochia Minimal.   Objective: Blood pressure (!) 91/58, pulse 63, temperature 98.2 F (36.8 C), temperature source Oral, resp. rate 16, height 5' (1.524 m), weight 168 lb (76.2 kg), last menstrual period 08/15/2016, unknown if currently breastfeeding.  Physical Exam:  General: alert, cooperative and no distress Lochia:normal flow Chest: no respiratory distress Heart:regular rate, distal pulses intact Abdomen: soft, nontender,  Uterine Fundus: firm, appropriately tender DVT Evaluation: No calf swelling or tenderness Extremities: no edema  Recent Labs    05/15/17 0230  HGB 11.2*  HCT 32.3*    Assessment/Plan:  ASSESSMENT: Gina Alvarez is a 26 y.o. T0Z6010 [redacted]w[redacted]d s/p SVD.  Plan for discharge tomorrow   LOS: 1 day   Jayliani Wanner MossDO 05/16/2017, 5:55 AM

## 2017-05-16 NOTE — Lactation Note (Signed)
This note was copied from a baby's chart. Lactation Consultation Note  Patient Name: Gina Alvarez YHOOI'L Date: 05/16/2017 Reason for consult: Follow-up assessment;Difficult latch Assisted with feeding.  Baby having difficulty sustaining latch.  20 mm nipple shield applied with instructions and baby latched well and fed actively with swallows.  Mom instructed on cleaning.  She has DEBP set up to post pump.  Maternal Data    Feeding Feeding Type: Breast Fed  LATCH Score Latch: Grasps breast easily, tongue down, lips flanged, rhythmical sucking.  Audible Swallowing: Spontaneous and intermittent  Type of Nipple: Everted at rest and after stimulation  Comfort (Breast/Nipple): Soft / non-tender  Hold (Positioning): Assistance needed to correctly position infant at breast and maintain latch.  LATCH Score: 9  Interventions    Lactation Tools Discussed/Used Tools: Nipple Shields Nipple shield size: 20   Consult Status Consult Status: Follow-up Date: 05/17/17 Follow-up type: In-patient    Ave Filter 05/16/2017, 10:46 PM

## 2017-05-16 NOTE — Lactation Note (Signed)
This note was copied from a baby's chart. Lactation Consultation Note  Patient Name: Gina Alvarez YBWLS'L Date: 05/16/2017 Reason for consult: Follow-up assessment;Term;Infant weight loss  Baby is 34 hours old , MD order from Dr. Laren Boom reviewed doc flow sheets 0 Latch scores 4--7 prior to consult.  Several 5 mins feedings ( and per mom latches and gets sleepy.)  The best feeding was 30 min.  Per mom baby was sleepy last night and was not showing feeding cues From 2230 - until this am. 4 wets , 3 stools, and one spit( small )  LC reviewed importance of STS feedings and since the baby is 65 hours old  And sluggish with feedings to place the baby STS after checking diaper every 2-3 hours,  Prior to latch - breast massage, hand express, firm support, when latching due to recessed  Chin and high palate to tickle the baby's upper lip until the baby opens wide and latch with breast  Compressions as shown until swallows and comfort achieved. Stimulate the baby has needed.  Discussed nutritive vs non- nutritive feedings patterns, and to watch for hanging out latched.   Good average feeding is 15 -20 mins with swallows.  Also recommended a DEBP for post pumping and Windy Carina aware to set mom up.  LC recommended post pumping after very feeding when the baby isn't cluster feeding.   LC checked for any tongue mobility issue - high palate , recessed chin, upper lip stretches  Well with exam and latched, baby able to stick tongue a distance over gum line.  Small notch noted under mid section of tongue, but no restriction for latching.   [place] suspects the depth was not being obtained with the latch.  Mom also mentioned the latches at the consult felt better and she was the most active.       Maternal Data Has patient been taught Hand Expression?: Yes  Feeding Feeding Type: Breast Fed Length of feed: 8 min(few swallows and depth obtained )  LATCH Score Latch: Grasps breast easily,  tongue down, lips flanged, rhythmical sucking.  Audible Swallowing: A few with stimulation  Type of Nipple: Everted at rest and after stimulation  Comfort (Breast/Nipple): Soft / non-tender  Hold (Positioning): Assistance needed to correctly position infant at breast and maintain latch.  LATCH Score: 8  Interventions Interventions: Breast feeding basics reviewed;Assisted with latch;Skin to skin;Breast massage;Breast compression;Adjust position;Support pillows;Position options  Lactation Tools Discussed/Used WIC Program: No   Consult Status Consult Status: Follow-up Date: 05/16/17 Follow-up type: In-patient    [place] 05/16/2017, 11:58 AM

## 2017-05-17 MED ORDER — IBUPROFEN 600 MG PO TABS: 600.0000 mg | ORAL_TABLET | Freq: Four times a day (QID) | ORAL | 0 refills | Status: DC

## 2017-05-17 NOTE — Discharge Summary (Signed)
OB Discharge Summary  Patient Name: Gina Alvarez DOB: [birth year] MRN: [id number]  Date of admission: 05/15/2017 Delivering MD: Sherene Sires   Date of discharge: 05/17/2017  Admitting diagnosis: 39WKS CTX Intrauterine pregnancy: [redacted]w[redacted]d     Secondary diagnosis:Active Problems:   Normal labor  Additional problems:none     Discharge diagnosis: Term Pregnancy Delivered                                                                     Post partum procedures:n/a  Augmentation: n/a  Complications: None  Hospital course:  Onset of Labor With Vaginal Delivery     26 y.o. yo P6P9509 at [redacted]w[redacted]d was admitted in Active Labor on 05/15/2017. Patient had an uncomplicated labor course as follows:  Membrane Rupture Time/Date: 4:50 AM ,05/15/2017   Intrapartum Procedures: Episiotomy: None [1]                                         Lacerations:  None [1]  Patient had a delivery of a Viable infant. 05/15/2017  Information for the patient's newborn:  Clarissia, Mckeen [[id number]]  Delivery Method: Vaginal, Spontaneous(Filed from Delivery Summary)    Pateint had an uncomplicated postpartum course.  She is ambulating, tolerating a regular diet, passing flatus, and urinating well. Patient is discharged home in stable condition on 05/17/17.   Physical exam  Vitals:   05/15/17 1853 05/16/17 0500 05/16/17 1800 05/17/17 0547  BP: (!) 103/58 (!) 91/58 (!) 99/59 125/80  Pulse: 79 63 62 75  Resp: 18 16 17 16   Temp: 98 F (36.7 C) 98.2 F (36.8 C) 98.1 F (36.7 C) 99.1 F (37.3 C)  TempSrc: Oral Oral Oral Oral  Weight:  161 lb 6.4 oz (73.2 kg)    Height:       General: alert, cooperative and no distress Lochia: appropriate Uterine Fundus: firm Incision: N/A DVT Evaluation: No evidence of DVT seen on physical exam. Labs: Lab Results  Component Value Date   WBC 15.7 (H) 05/15/2017   HGB 11.2 (L) 05/15/2017   HCT 32.3 (L) 05/15/2017   MCV 92.8 05/15/2017   PLT 230 05/15/2017    No flowsheet data found.  Discharge instruction: per After Visit Summary and "Baby and Me Booklet".  After Visit Meds:  Allergies as of 05/17/2017   No Known Allergies     Medication List    TAKE these medications   ibuprofen 600 MG tablet Commonly known as:  ADVIL,MOTRIN Take 1 tablet (600 mg total) by mouth every 6 (six) hours.   PRENATE MINI 18-0.6-0.4-350 MG Caps       Diet: routine diet  Activity: Advance as tolerated. Pelvic rest for 6 weeks.   Outpatient follow up:6 weeks Follow up Appt: Future Appointments  Date Time Provider Momence  05/20/2017 10:45 AM Jonnie Kind, MD [facility]   Follow up visit: No Follow-up on file.  Postpartum contraception: Nexplanon  Newborn Data: Live born female  Birth Weight: 6 lb 11.6 oz (3050 g) APGAR: 9, 9  Newborn Delivery   Birth date/time:  05/15/2017 10:07:00 Delivery type:  Vaginal, Spontaneous  Baby Feeding: Breast Disposition:home with mother   05/17/2017 Koren Shiver, CNM

## 2017-05-17 NOTE — Progress Notes (Signed)
This RN was told during report that the previous RN had trouble latching the baby and the baby has a recessed chin. Around 2100 patient called out needing assist. This RN provided, educated and assisted with hand pumping, spoon feeding and hand expression. RN assisted with this for approximately 44min-1hr. Education was additionally provided during this time on the correlation with eating and the bilirubin level, cleaning pump pieces, and breast feeding basics. Mom had stated that she has yet to latch the baby independently. Baby was fussy, crying and would not suck on finger. Baby was spoon feeding. RN called lactation. Mom expressed concerns with feeding as well as TCB levels multiple times. The TCB was rechecked by the NT and evaluated right below the 75% line by the RN. However, because of prior results being elevated, concerns from the mom, 5% weight loss and feeding issues the TSB was not D/C by RN.   Gina Alvarez Gina Debraann Livingstone, RN

## 2017-05-17 NOTE — Lactation Note (Signed)
This note was copied from a baby's chart. Lactation Consultation Note  Patient Name: Gina Alvarez Date: 05/17/2017 Reason for consult: Follow-up assessment;Infant weight loss   Follow up with mom of 46 hour old infant. Infant with 8 BF for 10-60 minutes, 5 BF attempts, 3 voids and 3 stools in last 24 hours. LATCH scores 6-9. Infant weight 6 lb 2.6 oz with weight loss of 8%.   Mom reports infant is sleepy at the breast. Mom reports infant is latching better with the NS that she was. Reviewed spoon feeding, awakening techniques with feedings, milk coming to volume and importance of frequent pumping post BF to get the milk flowing and to use milk for supplement.   Mom able to hand express and milk more readily available this morning. Mom reports she will start pumping more. Showed parents how to spoon feed and mom was expressing and spoon feeding when Xenia left room.   Parents are concerned with going home today, discussed feedings with Almyra Brace, NP and she is to follow up with bilirubin and weight this afternoon.   Enc mom to continue feeding infant 8-12 x in 24 hours at first feeding cues. Enc mom to hand express and spoon feed if infant sleepy at the breast before latching and follow BF with pumping/hand expression and spoon feeding all EBM expressed back to infant.   Reviewed I/O, signe of dehydration in the infant, Engorgement prevention/treatment and breast milk storage and expression.   Infant with follow up Ped appt Wed. Mom has a Spectra 2 pump at home.   Parents report all questions have been answered at this time. Mom to call out for feeding assistance as needed.   In basket message to OP clinic to call mom to schedule follow up [place] appt late this week or early next week with mom's approval.     Maternal Data Formula Feeding for Exclusion: No Has patient been taught Hand Expression?: Yes Does the patient have breastfeeding experience prior to this delivery?:  Yes  Feeding Feeding Type: Breast Fed Length of feed: 10 min  LATCH Score Latch: Repeated attempts needed to sustain latch, nipple held in mouth throughout feeding, stimulation needed to elicit sucking reflex.  Audible Swallowing: A few with stimulation  Type of Nipple: Everted at rest and after stimulation  Comfort (Breast/Nipple): Soft / non-tender  Hold (Positioning): Assistance needed to correctly position infant at breast and maintain latch.  LATCH Score: 7  Interventions Interventions: Breast feeding basics reviewed;Support pillows;Assisted with latch;Position options;Skin to skin;Expressed milk;Breast massage;Breast compression  Lactation Tools Discussed/Used Nipple shield size: 20 Pump Review: Setup, frequency, and cleaning;Milk Storage Initiated by:: Reviewed and encouraged every 2-3 hours   Consult Status Consult Status: Follow-up Date: 05/18/17 Follow-up type: In-patient    Debby Freiberg Toyna Erisman 05/17/2017, 10:34 AM

## 2017-05-18 ENCOUNTER — Ambulatory Visit: Payer: Self-pay

## 2017-05-18 NOTE — Lactation Note (Signed)
This note was copied from a baby's chart. Lactation Consultation Note  Patient Name: Gina Alvarez BMWUX'L Date: 05/18/2017 Reason for consult: Follow-up assessment;Term;Infant weight loss(9% weight loss )  Baby  Is 44 hours old  LC reviewed and updated the doc flow sheets with mom and dad.  LC recommended since the baby has 9% weight loss to not go over 3 hours without feeding.  Breast are full and easily hand expressed/ areola compressible. LC showed mom how to reverse  Pressure.  Baby last feeding was 47 ml of formula , and at this attempt didn't act to hungry. Mom aware to try later.  Per mom still using the NS #20 and complained of nipple tenderness. Nipples pinky red.  LC rechecked NS size and the #20 NS to snug. The #24 NS is a better fit.  Baby awake for short time after the Dr. Jasmine December and attempted, baby sleepy, not interested.  LC plan -  Shells between feedings X when sleeping Prior to latch breast massage, hand express, pre-pump if needed and reverse pressure.  Try latching and have dad assist to latch to obtain depth.  If needing NS - apply and instill EBM in the top for appetizer.  Latch for the 1st breast for 15 -20 mins , and supplement afterwards 30 ml of EBM , formula if not available.  Post pump both breast for 15 -20 mins, save milk for next feeding.   LC offered mom and LC O/P appt and she preferred to call back for appt, has the number.  Mother informed of post-discharge support and given phone number to the lactation department, including services for phone call assistance; out-patient appointments; and breastfeeding support group. List of other breastfeeding resources in the community given in the handout. Encouraged mother to call for problems or concerns related to breastfeeding.   Maternal Data Has patient been taught Hand Expression?: Yes  Feeding Feeding Type: Formula Nipple Type: Slow - flow  LATCH Score                   Interventions    Lactation Tools Discussed/Used     Consult Status Consult Status: Complete Date: 05/18/17    Myer Haff 05/18/2017, 9:14 AM

## 2017-05-20 ENCOUNTER — Other Ambulatory Visit: Payer: Managed Care, Other (non HMO) | Admitting: Obstetrics and Gynecology

## 2017-06-04 DIAGNOSIS — Z029 Encounter for administrative examinations, unspecified: Secondary | ICD-10-CM

## 2017-06-18 ENCOUNTER — Encounter: Payer: Self-pay | Admitting: Women's Health

## 2017-06-18 ENCOUNTER — Ambulatory Visit (INDEPENDENT_AMBULATORY_CARE_PROVIDER_SITE_OTHER): Payer: Managed Care, Other (non HMO) | Admitting: Women's Health

## 2017-06-18 NOTE — Patient Instructions (Signed)
NO SEX UNTIL AFTER YOU GET YOUR BIRTH CONTROL   Etonogestrel implant What is this medicine? ETONOGESTREL (et oh noe JES trel) is a contraceptive (birth control) device. It is used to prevent pregnancy. It can be used for up to 3 years. This medicine may be used for other purposes; ask your health care provider or pharmacist if you have questions. COMMON BRAND NAME(S): Implanon, Nexplanon What should I tell my health care provider before I take this medicine? They need to know if you have any of these conditions: -abnormal vaginal bleeding -blood vessel disease or blood clots -cancer of the breast, cervix, or liver -depression -diabetes -gallbladder disease -headaches -heart disease or recent heart attack -high blood pressure -high cholesterol -kidney disease -liver disease -renal disease -seizures -tobacco smoker -an unusual or allergic reaction to etonogestrel, other hormones, anesthetics or antiseptics, medicines, foods, dyes, or preservatives -pregnant or trying to get pregnant -breast-feeding How should I use this medicine? This device is inserted just under the skin on the inner side of your upper arm by a health care professional. Talk to your pediatrician regarding the use of this medicine in children. Special care may be needed. Overdosage: If you think you have taken too much of this medicine contact a poison control center or emergency room at once. NOTE: This medicine is only for you. Do not share this medicine with others. What if I miss a dose? This does not apply. What may interact with this medicine? Do not take this medicine with any of the following medications: -amprenavir -bosentan -fosamprenavir This medicine may also interact with the following medications: -barbiturate medicines for inducing sleep or treating seizures -certain medicines for fungal infections like ketoconazole and itraconazole -grapefruit juice -griseofulvin -medicines to treat  seizures like carbamazepine, felbamate, oxcarbazepine, phenytoin, topiramate -modafinil -phenylbutazone -rifampin -rufinamide -some medicines to treat HIV infection like atazanavir, indinavir, lopinavir, nelfinavir, tipranavir, ritonavir -St. John's wort This list may not describe all possible interactions. Give your health care provider a list of all the medicines, herbs, non-prescription drugs, or dietary supplements you use. Also tell them if you smoke, drink alcohol, or use illegal drugs. Some items may interact with your medicine. What should I watch for while using this medicine? This product does not protect you against HIV infection (AIDS) or other sexually transmitted diseases. You should be able to feel the implant by pressing your fingertips over the skin where it was inserted. Contact your doctor if you cannot feel the implant, and use a non-hormonal birth control method (such as condoms) until your doctor confirms that the implant is in place. If you feel that the implant may have broken or become bent while in your arm, contact your healthcare provider. What side effects may I notice from receiving this medicine? Side effects that you should report to your doctor or health care professional as soon as possible: -allergic reactions like skin rash, itching or hives, swelling of the face, lips, or tongue -breast lumps -changes in emotions or moods -depressed mood -heavy or prolonged menstrual bleeding -pain, irritation, swelling, or bruising at the insertion site -scar at site of insertion -signs of infection at the insertion site such as fever, and skin redness, pain or discharge -signs of pregnancy -signs and symptoms of a blood clot such as breathing problems; changes in vision; chest pain; severe, sudden headache; pain, swelling, warmth in the leg; trouble speaking; sudden numbness or weakness of the face, arm or leg -signs and symptoms of liver injury like dark yellow   or brown  urine; general ill feeling or flu-like symptoms; light-colored stools; loss of appetite; nausea; right upper belly pain; unusually weak or tired; yellowing of the eyes or skin -unusual vaginal bleeding, discharge -signs and symptoms of a stroke like changes in vision; confusion; trouble speaking or understanding; severe headaches; sudden numbness or weakness of the face, arm or leg; trouble walking; dizziness; loss of balance or coordination Side effects that usually do not require medical attention (report to your doctor or health care professional if they continue or are bothersome): -acne -back pain -breast pain -changes in weight -dizziness -general ill feeling or flu-like symptoms -headache -irregular menstrual bleeding -nausea -sore throat -vaginal irritation or inflammation This list may not describe all possible side effects. Call your doctor for medical advice about side effects. You may report side effects to FDA at 1-800-FDA-1088. Where should I keep my medicine? This drug is given in a hospital or clinic and will not be stored at home. NOTE: This sheet is a summary. It may not cover all possible information. If you have questions about this medicine, talk to your doctor, pharmacist, or health care provider.  2018 Elsevier/Gold Standard (2015-11-29 11:19:22)  

## 2017-06-18 NOTE — Progress Notes (Signed)
   POSTPARTUM VISIT Patient name: Gina Alvarez MRN [id number]  Date of birth: 10/08/1990 Chief Complaint:   postpartum visit (interestered in [place])  History of Present Illness:   Gina Alvarez is a 27 y.o. [id number] Caucasian female being seen today for a postpartum visit. She is 4 weeks postpartum following a spontaneous vaginal delivery at 39.6 gestational weeks. Anesthesia: epidural. I have fully reviewed the prenatal and intrapartum course. Pregnancy uncomplicated. Family hx of BRCA gene +, pt wanted to wait until after had baby for her to be tested, however she declines referral to genetic counselor today, states she wants to wait a little longer- will let us know.  Postpartum course has been uncomplicated. Bleeding no bleeding. Bowel function is normal. Bladder function is normal.  Patient is not sexually active. Last sexual activity: prior to birth of baby.  Contraception method is wants nexplanon.  [place] Postpartum Depression Screening: negative. Score 0.   Last pap 2018.  Results were normal .  Patient's last menstrual period was 08/15/2016 (exact date).  Baby's course has been uncomplicated. Baby is feeding by breast first, now bottle.  Review of Systems:   Pertinent items are noted in HPI Denies Abnormal vaginal discharge w/ itching/odor/irritation, headaches, visual changes, shortness of breath, chest pain, abdominal pain, severe nausea/vomiting, or problems with urination or bowel movements. Pertinent History Reviewed:  Reviewed past medical,surgical, obstetrical and family history.  Reviewed problem list, medications and allergies. OB History  Gravida Para Term Preterm AB Living  _0 0 1 2  SAB TAB Ectopic Multiple Live Births  1 0 0 0 2    # Outcome Date GA Lbr Len/2nd Weight Sex Delivery Anes PTL Lv  3 Term 05/15/17 3w6d05:20 / 02:35 6 lb 11.6 oz (3.05 kg) F Vag-Spont EPI  LIV     Birth Comments: caput  2 SAB 05/2015          1 Term 06/12/12 [id number]:15 /  01:55 7 lb 0.4 oz (3.185 kg) F Vag-Vacuum EPI N LIV     Physical Assessment:   Vitals:   06/18/17 1021  BP: 102/72  Pulse: 86  Weight: 157 lb 8 oz (71.4 kg)  Height: 5' (1.524 m)  Body mass index is 30.76 kg/m.       Physical Examination:   General appearance: alert, well appearing, and in no distress  Mental status: alert, oriented to person, place, and time  Skin: warm & dry   Cardiovascular: normal heart rate noted   Respiratory: normal respiratory effort, no distress   Breasts: deferred, no complaints   Abdomen: soft, non-tender   Pelvic: VULVA: normal appearing vulva with no masses, tenderness or lesions, UTERUS: uterus is normal size, shape, consistency and nontender  Rectal: no hemorrhoids  Extremities: no edema       No results found for this or any previous visit (from the past 24 hour(s)).  Assessment & Plan:  1) Postpartum exam 2) 4 wks s/p SVB 3) Bottlefeeding 4) Depression screening 5) Contraception counseling, pt prefers abstinence until nexplanon  6) Family hx BRCA gene +> pt not ready for referral to genetic counselor/testing right now, states she will let usKoreanow  Meds: No orders of the defined types were placed in this encounter.   Follow-up: Return for next week for Nexplanon insertion, then 76m12m for physical.   No orders of the defined types were placed in this encounter.   BooTawnya CrookM, WHNT[facility]24/2019 10:59 AM

## 2017-06-25 ENCOUNTER — Ambulatory Visit (INDEPENDENT_AMBULATORY_CARE_PROVIDER_SITE_OTHER): Payer: Managed Care, Other (non HMO) | Admitting: Women's Health

## 2017-06-25 ENCOUNTER — Encounter: Payer: Self-pay | Admitting: Women's Health

## 2017-06-25 VITALS — BP 100/64 | HR 84 | Ht 64.0 in | Wt 159.0 lb

## 2017-06-25 DIAGNOSIS — Z3046 Encounter for surveillance of implantable subdermal contraceptive: Secondary | ICD-10-CM

## 2017-06-25 DIAGNOSIS — Z3202 Encounter for pregnancy test, result negative: Secondary | ICD-10-CM

## 2017-06-25 DIAGNOSIS — Z30017 Encounter for initial prescription of implantable subdermal contraceptive: Secondary | ICD-10-CM | POA: Insufficient documentation

## 2017-06-25 HISTORY — DX: Encounter for initial prescription of implantable subdermal contraceptive: Z30.017

## 2017-06-25 LAB — POCT URINE PREGNANCY: Preg Test, Ur: NEGATIVE

## 2017-06-25 MED ORDER — ETONOGESTREL 68 MG ~~LOC~~ IMPL
68.0000 mg | DRUG_IMPLANT | Freq: Once | SUBCUTANEOUS | Status: AC
  Administered 2017-06-25: 68 mg via SUBCUTANEOUS

## 2017-06-25 NOTE — Progress Notes (Signed)
   [place] INSERTION Patient name: Gina Alvarez MRN [id number]  Date of birth: 05-25-1991 Subjective Findings:   Gina Alvarez is a 27 y.o. G8P2012 Caucasian female being seen today for insertion of a Nexplanon.   No LMP recorded. Last sexual intercourse was prior to birth of baby Last pap2018. Results were:  normal  Risks/benefits/side effects of Nexplanon have been discussed and her questions have been answered.  Specifically, a failure rate of 05/998 has been reported, with an increased failure rate if pt takes [place] and/or antiseizure medicaitons.  She is aware of the common side effect of irregular bleeding, which the incidence of decreases over time. Signed copy of informed consent in chart.  Pertinent History Reviewed:   Reviewed past medical,surgical, social, obstetrical and family history.  Reviewed problem list, medications and allergies. Objective Findings & Procedure:    Vitals:   06/25/17 1030  BP: 100/64  Pulse: 84  Weight: 159 lb (72.1 kg)  Height: 5\' 4"  (1.626 m)  Body mass index is 27.29 kg/m.  No results found for this or any previous visit (from the past 24 hour(s)).   Time out was performed.  She is right-handed, so her left arm, approximately 10cm from the medial epicondyle and 3-5cm posterior to the sulcus, was cleansed with alcohol and anesthetized with 2cc of 2% Lidocaine.  The area was cleansed again with betadine and the Nexplanon was inserted per manufacturer's recommendations without difficulty.  3 steri-strips and pressure bandage were applied. The patient tolerated the procedure well.  Assessment & Plan:   1) Nexplanon insertion Pt was instructed to keep the area clean and dry, remove pressure bandage in 24 hours, and keep insertion site covered with the steri-strip for 3-5 days.  Back up contraception was recommended for 2 weeks.  She was given a card indicating date Nexplanon was inserted and date it needs to be removed. Follow-up PRN  problems.  Orders Placed This Encounter  Procedures  . POCT urine pregnancy    Follow-up: Return in about 6 months (around 12/23/2017) for Physical.  Tawnya Crook CNM, WHNP-BC 06/25/2017 11:12 AM

## 2017-06-25 NOTE — Addendum Note (Signed)
Addended by: Christiana Pellant A on: 06/25/2017 11:15 AM   Modules accepted: Orders

## 2017-06-25 NOTE — Patient Instructions (Signed)

## 2017-08-25 ENCOUNTER — Ambulatory Visit (INDEPENDENT_AMBULATORY_CARE_PROVIDER_SITE_OTHER): Payer: Managed Care, Other (non HMO)

## 2017-08-25 ENCOUNTER — Encounter: Payer: Self-pay | Admitting: Women's Health

## 2017-08-25 ENCOUNTER — Ambulatory Visit (INDEPENDENT_AMBULATORY_CARE_PROVIDER_SITE_OTHER): Payer: Managed Care, Other (non HMO) | Admitting: Women's Health

## 2017-08-25 VITALS — Wt 154.0 lb

## 2017-08-25 VITALS — BP 116/84 | HR 104 | Ht 60.0 in | Wt 154.0 lb

## 2017-08-25 DIAGNOSIS — Z3202 Encounter for pregnancy test, result negative: Secondary | ICD-10-CM

## 2017-08-25 DIAGNOSIS — Z3042 Encounter for surveillance of injectable contraceptive: Secondary | ICD-10-CM | POA: Diagnosis not present

## 2017-08-25 DIAGNOSIS — L089 Local infection of the skin and subcutaneous tissue, unspecified: Secondary | ICD-10-CM

## 2017-08-25 DIAGNOSIS — Z3049 Encounter for surveillance of other contraceptives: Secondary | ICD-10-CM | POA: Diagnosis not present

## 2017-08-25 DIAGNOSIS — Z3046 Encounter for surveillance of implantable subdermal contraceptive: Secondary | ICD-10-CM

## 2017-08-25 LAB — POCT URINE PREGNANCY: Preg Test, Ur: NEGATIVE

## 2017-08-25 MED ORDER — MEDROXYPROGESTERONE ACETATE 150 MG/ML IM SUSP: 150.0000 mg | INTRAMUSCULAR | 3 refills | Status: DC

## 2017-08-25 MED ORDER — MEDROXYPROGESTERONE ACETATE 150 MG/ML IM SUSP
150.0000 mg | Freq: Once | INTRAMUSCULAR | Status: AC
  Administered 2017-08-25: 150 mg via INTRAMUSCULAR

## 2017-08-25 MED ORDER — SULFAMETHOXAZOLE-TRIMETHOPRIM 800-160 MG PO TABS: 1.0000 | ORAL_TABLET | Freq: Two times a day (BID) | ORAL | 0 refills | Status: DC

## 2017-08-25 NOTE — Progress Notes (Signed)
Pt here for depo injection 150 mg IM given rt ventrogluteal. Tolerated well. Return 12 weeks next injection. Pad CMA

## 2017-08-25 NOTE — Progress Notes (Signed)
   [place] REMOVAL Patient name: Gina Alvarez MRN [id number]  Date of birth: 1990-08-10 Subjective Findings:   Gina Alvarez is a 27 y.o. [phone] Caucasian female being seen today for report of hard bump at distal end of nexplanon since insertion on 06/25/17. No redness or drainage. No fever/chills. Hard bump is a little tender if you push on it. Discussed antibiotics vs. removal- pt wants removal and switch to depo.  Signed copy of informed consent in chart.   Patient's last menstrual period was 08/04/2017 (approximate). Last pap2018. Results were:  normal The planned method of family planning is Depo-Provera injections Pertinent History Reviewed:   Reviewed past medical,surgical, social, obstetrical and family history.  Reviewed problem list, medications and allergies. Objective Findings & Procedure:    Vitals:   08/25/17 0950  BP: 116/84  Pulse: (!) 104  Weight: 154 lb (69.9 kg)  Height: 5' (1.524 m)  Body mass index is 30.08 kg/m.  No results found for this or any previous visit (from the past 24 hour(s)).   Time out was performed.  Nexplanon site identified.  Small ~54mm raised flesh colored firm bump w/ lighter colored head at distal end of Nexplanon, slightly tender to palpation. Area prepped in usual sterile fashon. One cc of 2% lidocaine was used to anesthetize the area at the distal end of the implant. A small stab incision was made right beside the implant on the distal portion, no drainage noted.  The Nexplanon rod was grasped using hemostats and removed without difficulty.  There was less than 3 cc blood loss. There were no complications.  Steri-strips were applied over the small incision and a pressure bandage was applied.  The patient tolerated the procedure well. Assessment & Plan:   1) Nexplanon removal d/t possible infection She was instructed to keep the area clean and dry, remove pressure bandage in 24 hours, and keep insertion site covered with the steri-strip  for 3-5 days.  Will rx bactrim BID x 7d. Rx depo, come today for first injection, condoms x 2wks.  Follow-up PRN problems.  No orders of the defined types were placed in this encounter.   Follow-up: Return for July for physical.  Roma Schanz CNM, [facility] 08/25/2017 10:37 AM

## 2017-08-25 NOTE — Addendum Note (Signed)
Addended by: Diona Fanti A on: 08/25/2017 02:17 PM   Modules accepted: Level of Service

## 2017-11-03 ENCOUNTER — Telehealth: Payer: Self-pay | Admitting: Family Medicine

## 2017-11-17 ENCOUNTER — Ambulatory Visit: Payer: Managed Care, Other (non HMO)

## 2017-11-19 ENCOUNTER — Encounter: Payer: Managed Care, Other (non HMO) | Admitting: *Deleted

## 2017-11-19 MED ORDER — MEDROXYPROGESTERONE ACETATE 150 MG/ML IM SUSP: 150.0000 mg | Freq: Once | INTRAMUSCULAR | Status: DC

## 2017-11-23 ENCOUNTER — Encounter: Payer: Self-pay | Admitting: *Deleted

## 2017-11-23 ENCOUNTER — Ambulatory Visit (INDEPENDENT_AMBULATORY_CARE_PROVIDER_SITE_OTHER): Payer: Managed Care, Other (non HMO) | Admitting: *Deleted

## 2017-11-23 ENCOUNTER — Other Ambulatory Visit: Payer: Self-pay

## 2017-11-23 ENCOUNTER — Ambulatory Visit: Payer: Managed Care, Other (non HMO)

## 2017-11-23 DIAGNOSIS — Z3202 Encounter for pregnancy test, result negative: Secondary | ICD-10-CM | POA: Diagnosis not present

## 2017-11-23 DIAGNOSIS — Z3042 Encounter for surveillance of injectable contraceptive: Secondary | ICD-10-CM | POA: Diagnosis not present

## 2017-11-23 LAB — POCT URINE PREGNANCY: PREG TEST UR: NEGATIVE

## 2017-11-23 MED ORDER — MEDROXYPROGESTERONE ACETATE 150 MG/ML IM SUSP
150.0000 mg | Freq: Once | INTRAMUSCULAR | Status: AC
  Administered 2017-11-23: 150 mg via INTRAMUSCULAR

## 2017-11-23 NOTE — Progress Notes (Signed)
Pt given DepoProvera 150mg  IM right VG without complications. Advised to return in 12 weeks for next injection

## 2017-11-30 ENCOUNTER — Ambulatory Visit (INDEPENDENT_AMBULATORY_CARE_PROVIDER_SITE_OTHER): Payer: Managed Care, Other (non HMO) | Admitting: Family Medicine

## 2017-11-30 ENCOUNTER — Encounter: Payer: Self-pay | Admitting: Family Medicine

## 2017-11-30 VITALS — BP 106/63 | HR 93 | Temp 98.5°F | Ht 60.0 in

## 2017-11-30 DIAGNOSIS — Z130 Encounter for screening for diseases of the blood and blood-forming organs and certain disorders involving the immune mechanism: Secondary | ICD-10-CM | POA: Diagnosis not present

## 2017-11-30 DIAGNOSIS — Z131 Encounter for screening for diabetes mellitus: Secondary | ICD-10-CM | POA: Diagnosis not present

## 2017-11-30 DIAGNOSIS — Z13220 Encounter for screening for lipoid disorders: Secondary | ICD-10-CM | POA: Diagnosis not present

## 2017-11-30 DIAGNOSIS — Z13228 Encounter for screening for other metabolic disorders: Secondary | ICD-10-CM

## 2017-11-30 DIAGNOSIS — Z3042 Encounter for surveillance of injectable contraceptive: Secondary | ICD-10-CM

## 2017-11-30 DIAGNOSIS — Z8481 Family history of carrier of genetic disease: Secondary | ICD-10-CM | POA: Diagnosis not present

## 2017-11-30 DIAGNOSIS — Z7689 Persons encountering health services in other specified circumstances: Secondary | ICD-10-CM

## 2017-11-30 DIAGNOSIS — Z803 Family history of malignant neoplasm of breast: Secondary | ICD-10-CM | POA: Diagnosis not present

## 2017-11-30 MED ORDER — MEDROXYPROGESTERONE ACETATE 150 MG/ML IM SUSP: 150.0000 mg | INTRAMUSCULAR | 3 refills | Status: DC

## 2017-11-30 NOTE — Progress Notes (Signed)
Subjective: UJ:[id number] care, contraception HPI: Gina Alvarez is a 27 y.o. female presenting to clinic today for:  1. Contraception  Patient is in a monogamous relationship with her husband.  She has been using Depo-Provera for contraception after she had an adverse event with Nexplanon.  She notes that the Nexplanon gave her quite a bit of discomfort and it required to be removed.  She is since been doing well on Depo-Provera and goes every 3 months [facility].  She would prefer to have this done here if possible since we are much closer.  Denies any abnormal vaginal bleeding, breast tenderness.  2.  Family history of breast cancer Patient reports a family history of breast cancer in her mother, citing that she was diagnosed last year at age 58.  She had a double mastectomy.  She was positive for the BRCA gene.  No known family history otherwise of ovarian cancer, breast cancers or prostate cancer.  No colon cancer.  She notes that neither her sister, aunts or maternal grandmother have been tested for BRCA.  However, she would like to be tested.  She denies any breast concerns, including lumps, nipple discharge, skin changes.  Past Medical History:  Diagnosis Date  . Contraception management 08/19/2012   nexplanon inserted 08/19/12 in left arm  . Irregular menstrual bleeding 09/26/2013  . Medical history non-contributory    Past Surgical History:  Procedure Laterality Date  . NO PAST SURGERIES     Social History   Socioeconomic History  . Marital status: Single    Spouse name: Not on file  . Number of children: Not on file  . Years of education: Not on file  . Highest education level: Not on file  Occupational History  . Not on file  Social Needs  . Financial resource strain: Not on file  . Food insecurity:    Worry: Not on file    Inability: Not on file  . Transportation needs:    Medical: Not on file    Non-medical: Not on file   Tobacco Use  . Smoking status: Current Every Day Smoker    Packs/day: 0.50    Years: 8.00    Pack years: 4.00    Types: Cigarettes  . Smokeless tobacco: Never Used  . Tobacco comment: trying to lower in prep for having another baby (12/2015)  Substance and Sexual Activity  . Alcohol use: Never    Frequency: Never    Comment: maybe 1 drink per month  . Drug use: Never  . Sexual activity: Not Currently    Partners: Male    Birth control/protection: Inserts  Lifestyle  . Physical activity:    Days per week: Not on file    Minutes per session: Not on file  . Stress: Not on file  Relationships  . Social connections:    Talks on phone: Not on file    Gets together: Not on file    Attends religious service: Not on file    Active member of club or organization: Not on file    Attends meetings of clubs or organizations: Not on file    Relationship status: Not on file  . Intimate partner violence:    Fear of current or ex partner: Not on file    Emotionally abused: Not on file    Physically abused: Not on file    Forced sexual activity: Not on file  Other Topics Concern  .  Not on file  Social History Narrative  . Not on file   Current Meds  Medication Sig  . medroxyPROGESTERone (DEPO-PROVERA) 150 MG/ML injection Inject 1 mL (150 mg total) into the muscle every 3 (three) months.  . [DISCONTINUED] medroxyPROGESTERone (DEPO-PROVERA) 150 MG/ML injection Inject 1 mL (150 mg total) into the muscle every 3 (three) months.   Family History  Problem Relation Age of Onset  . Breast cancer Mother        dx 30-44; BRCA2+; stage III w/ chemo and radiation  . Cancer Mother   . Asthma Daughter   . Lupus Paternal Grandmother        d. 34  . Skin cancer Maternal Grandmother        "skin discoloration on arms" - not spotty  . Mental illness Maternal Aunt    No Known Allergies   Health Maintenance: UTD  ROS: Per HPI  Objective: Office vital signs reviewed. BP 106/63   Pulse 93    Temp 98.5 F (36.9 C) (Oral)   Ht 5' (1.524 m)   BMI 30.08 kg/m   Physical Examination:  General: Awake, alert, well nourished, No acute distress HEENT: Normal    Eyes: PERRLA, extraocular movement in tact, sclera white Cardio: regular rate and rhythm, S1S2 heard, no murmurs appreciated Pulm: clear to auscultation bilaterally, no wheezes, rhonchi or rales; normal work of breathing on room air Extremities: warm, well perfused, No edema, cyanosis or clubbing; +2 pulses bilaterally MSK: normal gait and normal station Skin: dry; intact; no rashes or lesions Neuro: No focal neurologic deficits.  Psych: Speech normal, affect appropriate, pleasant, mood stable, interactive Depression screen [facility] 2/9 11/30/2017 10/21/2016  Decreased Interest 0 0  Down, Depressed, Hopeless 0 0  PHQ - 2 Score 0 0  Altered sleeping - 0  Tired, decreased energy - 1  Change in appetite - 0  Feeling bad or failure about yourself  - 0  Trouble concentrating - 0  Moving slowly or fidgety/restless - 0  Suicidal thoughts - 0  PHQ-9 Score - 1    Assessment/ Plan: 27 y.o. female   1. Establishing care with new doctor, encounter for Previously patient of Dr. Murrell Redden.  She is transferring care to this office, as her children are seen here.  2. Encounter for surveillance of injectable contraceptive Depo-Provera refilled.  Last injection was 11/23/2017.  Plan for next injection at the end of September, beginning of October.  Patient will bring medication to office.  3. Screening for metabolic disorder Patient needs labs for work screening.  These have been ordered and she will come in fasting. - CMP14+EGFR; Future  4. Screening for deficiency anemia - CBC with Differential; Future  5. Screening for lipid disorders - Lipid Panel; Future  6. Screening for diabetes mellitus - Bayer DCA Hb A1c Waived; Future  7. Family history of breast cancer in mother Patient wished to have screening for BRCA gene.  I have  placed a referral to genetics to have this done. - Ambulatory referral to Genetics  8. Family history of breast cancer gene mutation in first degree relative - Ambulatory referral to [place], [place] [phone]

## 2018-01-20 ENCOUNTER — Encounter: Payer: Self-pay | Admitting: Family Medicine

## 2018-02-10 ENCOUNTER — Ambulatory Visit (INDEPENDENT_AMBULATORY_CARE_PROVIDER_SITE_OTHER): Payer: Managed Care, Other (non HMO) | Admitting: Family Medicine

## 2018-02-10 ENCOUNTER — Encounter: Payer: Self-pay | Admitting: Family Medicine

## 2018-02-10 VITALS — BP 102/66 | HR 97 | Temp 98.1°F | Ht 60.0 in | Wt 149.0 lb

## 2018-02-10 DIAGNOSIS — G8929 Other chronic pain: Secondary | ICD-10-CM

## 2018-02-10 DIAGNOSIS — Z0000 Encounter for general adult medical examination without abnormal findings: Secondary | ICD-10-CM

## 2018-02-10 DIAGNOSIS — M5441 Lumbago with sciatica, right side: Secondary | ICD-10-CM

## 2018-02-10 NOTE — Progress Notes (Signed)
Gina Alvarez is a 27 y.o. female presents to office today for annual physical exam examination.    Concerns today include: 1.  Chronic low back pain Patient reports history of chronic low back pain that seemed to get better after a chiropractic session several years ago.  After her recent pregnancy, it started coming back.  She points to the entire lumbar region as the area of pain.  She does note some radiation down the right lower extremity with intermittent tingling down the right lower extremity.  No weakness, falls.  She is wondering if she can see a chiropractor again for this issue.   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  No structure.  She does report quite a bit of soda intake daily and not enough water.  She does not consume milk but does consume dairy., Exercise: She is physically active at work but has no structured exercise program. Last eye exam: Followed by my eye doctor.  Due for eye exam Last dental exam: Sees regularly Last pap smear: Last performed with OB/GYN 09/2016 Refills needed today: None Immunizations needed: Flu shot  Past Medical History:  Diagnosis Date  . Contraception management 08/19/2012   nexplanon inserted 08/19/12 in left arm  . Irregular menstrual bleeding 09/26/2013  . Medical history non-contributory    Social History   Socioeconomic History  . Marital status: Single    Spouse name: Not on file  . Number of children: Not on file  . Years of education: Not on file  . Highest education level: Not on file  Occupational History  . Not on file  Social Needs  . Financial resource strain: Not on file  . Food insecurity:    Worry: Not on file    Inability: Not on file  . Transportation needs:    Medical: Not on file    Non-medical: Not on file  Tobacco Use  . Smoking status: Former Smoker   Packs/day: 0.50    Years: 8.00    Pack years: 4.00    Types: Cigarettes    Last attempt to quit: 12/26/2017    Years since quitting: 0.1  . Smokeless tobacco: Never Used  Substance and Sexual Activity  . Alcohol use: Never    Frequency: Never    Comment: maybe 1 drink per month  . Drug use: Never  . Sexual activity: Yes    Partners: Male    Birth control/protection: Injection  Lifestyle  . Physical activity:    Days per week: Not on file    Minutes per session: Not on file  . Stress: Not on file  Relationships  . Social connections:    Talks on phone: Not on file    Gets together: Not on file    Attends religious service: Not on file    Active member of club or organization: Not on file    Attends meetings of clubs or organizations: Not on file    Relationship status: Not on file  . Intimate partner violence:    Fear of current or ex partner: Not on file    Emotionally abused: Not on file    Physically abused: Not on file    Forced sexual activity: Not on file  Other Topics Concern  . Not on file  Social History Narrative  . Not on file  Past Surgical History:  Procedure Laterality Date  . NO PAST SURGERIES     Family History  Problem Relation Age of Onset  . Breast cancer Mother        dx 71-44; BRCA2+; stage III w/ chemo and radiation  . Cancer Mother   . Asthma Daughter   . Lupus Paternal Grandmother        d. 65  . Skin cancer Maternal Grandmother        "skin discoloration on arms" - not spotty  . Mental illness Maternal Aunt     Current Outpatient Medications:  .  medroxyPROGESTERone (DEPO-PROVERA) 150 MG/ML injection, Inject 1 mL (150 mg total) into the muscle every 3 (three) months., Disp: 1 mL, Rfl: 3  No Known Allergies   ROS: Review of Systems Constitutional: negative Eyes: positive for contacts/glasses Ears, nose, mouth, throat, and face: negative Respiratory: negative Cardiovascular: negative Gastrointestinal:  negative Genitourinary:negative Integument/breast: negative Hematologic/lymphatic: negative Musculoskeletal:positive for back pain Neurological: positive for intermittent tingling in RLE. Behavioral/Psych: negative Endocrine: negative Allergic/Immunologic: negative    Physical exam BP 102/66   Pulse 97   Temp 98.1 F (36.7 C) (Oral)   Ht 5' (1.524 m)   Wt 149 lb (67.6 kg)   BMI 29.10 kg/m  General appearance: alert, cooperative, appears stated age and no distress Head: Normocephalic, without obvious abnormality, atraumatic Eyes: negative findings: lids and lashes normal, conjunctivae and sclerae normal, corneas clear and pupils equal, round, reactive to light and accomodation Ears: normal TM's and external ear canals both ears Nose: Nares normal. Septum midline. Mucosa normal. No drainage or sinus tenderness. Throat: lips, mucosa, and tongue normal; teeth and gums normal Neck: no adenopathy, supple, symmetrical, trachea midline and thyroid not enlarged, symmetric, no tenderness/mass/nodules Back: mild TTP over lumbosacral junction and over SI joint. Lungs: clear to auscultation bilaterally Heart: regular rate and rhythm, S1, S2 normal, no murmur, click, rub or gallop Abdomen: soft, non-tender; bowel sounds normal; no masses,  no organomegaly Extremities: extremities normal, atraumatic, no cyanosis or edema Pulses: 2+ and symmetric Skin: Skin color, texture, turgor normal. No rashes or lesions Lymph nodes: Cervical, supraclavicular, and axillary nodes normal. Neurologic: Alert and oriented X 3, normal strength and tone. Normal symmetric reflexes. Normal coordination and gait  Psych: Mood stable, speech normal, affect appropriate, pleasant, interactive, does not appear to be responding to internal stimuli.  Depression screen [facility] 2/9 02/10/2018 11/30/2017 10/21/2016  Decreased Interest 0 0 0  Down, Depressed, Hopeless 0 0 0  PHQ - 2 Score 0 0 0  Altered sleeping - - 0  Tired,  decreased energy - - 1  Change in appetite - - 0  Feeling bad or failure about yourself  - - 0  Trouble concentrating - - 0  Moving slowly or fidgety/restless - - 0  Suicidal thoughts - - 0  PHQ-9 Score - - 1    Assessment/ Plan: [name] here for annual physical exam.   1. Annual physical exam BMI slightly elevated otherwise appears to be in good health.  We did discuss balanced diet and reduction and soda intake.  She will come in for fasting labs tomorrow morning.  We will complete her form for work and return on Friday.  2. Chronic bilateral low back pain with right-sided sciatica Okay to see a chiropractor if she desires.  We discussed that physical therapy is an option as well.  She will let me know if she wants to have a referral to PT.  She will follow-up PRN.   Counseled on healthy lifestyle choices, including diet (rich in fruits, vegetables and lean meats and low in salt and simple carbohydrates) and exercise (at least 30 minutes of moderate physical activity daily).  Patient to follow up in 1 year for annual exam or sooner if needed.  Gardiner Espana M. Lajuana Ripple, DO

## 2018-02-10 NOTE — Patient Instructions (Signed)
Come in tomorrow for fasting labs.  I will have the results ready for you on Friday.  You should be able to pick up your form on Friday as well.   Preventive Care 18-39 Years, Female Preventive care refers to lifestyle choices and visits with your health care provider that can promote health and wellness. What does preventive care include?  A yearly physical exam. This is also called an annual well check.  Dental exams once or twice a year.  Routine eye exams. Ask your health care provider how often you should have your eyes checked.  Personal lifestyle choices, including: ? Daily care of your teeth and gums. ? Regular physical activity. ? Eating a healthy diet. ? Avoiding tobacco and drug use. ? Limiting alcohol use. ? Practicing safe sex. ? Taking vitamin and mineral supplements as recommended by your health care provider. What happens during an annual well check? The services and screenings done by your health care provider during your annual well check will depend on your age, overall health, lifestyle risk factors, and family history of disease. Counseling Your health care provider may ask you questions about your:  Alcohol use.  Tobacco use.  Drug use.  Emotional well-being.  Home and relationship well-being.  Sexual activity.  Eating habits.  Work and work Statistician.  Method of birth control.  Menstrual cycle.  Pregnancy history.  Screening You may have the following tests or measurements:  Height, weight, and BMI.  Diabetes screening. This is done by checking your blood sugar (glucose) after you have not eaten for a while (fasting).  Blood pressure.  Lipid and cholesterol levels. These may be checked every 5 years starting at age 59.  Skin check.  Hepatitis C blood test.  Hepatitis B blood test.  Sexually transmitted disease (STD) testing.  BRCA-related cancer screening. This may be done if you have a family history of breast, ovarian,  tubal, or peritoneal cancers.  Pelvic exam and Pap test. This may be done every 3 years starting at age 45. Starting at age 43, this may be done every 5 years if you have a Pap test in combination with an HPV test.  Discuss your test results, treatment options, and if necessary, the need for more tests with your health care provider. Vaccines Your health care provider may recommend certain vaccines, such as:  Influenza vaccine. This is recommended every year.  Tetanus, diphtheria, and acellular pertussis (Tdap, Td) vaccine. You may need a Td booster every 10 years.  Varicella vaccine. You may need this if you have not been vaccinated.  HPV vaccine. If you are 37 or younger, you may need three doses over 6 months.  Measles, mumps, and rubella (MMR) vaccine. You may need at least one dose of MMR. You may also need a second dose.  Pneumococcal 13-valent conjugate (PCV13) vaccine. You may need this if you have certain conditions and were not previously vaccinated.  Pneumococcal polysaccharide (PPSV23) vaccine. You may need one or two doses if you smoke cigarettes or if you have certain conditions.  Meningococcal vaccine. One dose is recommended if you are age 25-21 years and a first-year college student living in a residence hall, or if you have one of several medical conditions. You may also need additional booster doses.  Hepatitis A vaccine. You may need this if you have certain conditions or if you travel or work in places where you may be exposed to hepatitis A.  Hepatitis B vaccine. You may need this  if you have certain conditions or if you travel or work in places where you may be exposed to hepatitis B.  Haemophilus influenzae type b (Hib) vaccine. You may need this if you have certain risk factors.  Talk to your health care provider about which screenings and vaccines you need and how often you need them. This information is not intended to replace advice given to you by your  health care provider. Make sure you discuss any questions you have with your health care provider. Document Released: 07/08/2001 Document Revised: 01/30/2016 Document Reviewed: 03/13/2015 Elsevier Interactive Patient Education  [employer].

## 2018-02-11 ENCOUNTER — Other Ambulatory Visit: Payer: Managed Care, Other (non HMO)

## 2018-02-11 DIAGNOSIS — Z13220 Encounter for screening for lipoid disorders: Secondary | ICD-10-CM

## 2018-02-11 DIAGNOSIS — Z130 Encounter for screening for diseases of the blood and blood-forming organs and certain disorders involving the immune mechanism: Secondary | ICD-10-CM

## 2018-02-11 DIAGNOSIS — Z13228 Encounter for screening for other metabolic disorders: Secondary | ICD-10-CM

## 2018-02-11 DIAGNOSIS — Z131 Encounter for screening for diabetes mellitus: Secondary | ICD-10-CM

## 2018-02-11 LAB — BAYER DCA HB A1C WAIVED: HB A1C (BAYER DCA - WAIVED): 5.1 % (ref <7.0)

## 2018-02-12 LAB — CBC WITH DIFFERENTIAL/PLATELET
Basophils Absolute: 0 10*3/uL (ref 0.0–0.2)
Basos: 0 %
EOS (ABSOLUTE): 0.1 10*3/uL (ref 0.0–0.4)
EOS: 1 %
HEMATOCRIT: 41.3 % (ref 34.0–46.6)
Hemoglobin: 13.5 g/dL (ref 11.1–15.9)
IMMATURE GRANULOCYTES: 0 %
Immature Grans (Abs): 0 10*3/uL (ref 0.0–0.1)
Lymphocytes Absolute: 2.5 10*3/uL (ref 0.7–3.1)
Lymphs: 32 %
MCH: 30.5 pg (ref 26.6–33.0)
MCHC: 32.7 g/dL (ref 31.5–35.7)
MCV: 93 fL (ref 79–97)
MONOS ABS: 0.5 10*3/uL (ref 0.1–0.9)
Monocytes: 7 %
NEUTROS PCT: 60 %
Neutrophils Absolute: 4.6 10*3/uL (ref 1.4–7.0)
Platelets: 335 10*3/uL (ref 150–450)
RBC: 4.43 x10E6/uL (ref 3.77–5.28)
RDW: 12.8 % (ref 12.3–15.4)
WBC: 7.6 10*3/uL (ref 3.4–10.8)

## 2018-02-12 LAB — CMP14+EGFR
ALT: 5 IU/L (ref 0–32)
AST: 9 IU/L (ref 0–40)
Albumin/Globulin Ratio: 2 (ref 1.2–2.2)
Albumin: 4.7 g/dL (ref 3.5–5.5)
Alkaline Phosphatase: 56 IU/L (ref 39–117)
BUN/Creatinine Ratio: 14 (ref 9–23)
BUN: 8 mg/dL (ref 6–20)
Bilirubin Total: 0.3 mg/dL (ref 0.0–1.2)
CALCIUM: 9.7 mg/dL (ref 8.7–10.2)
CO2: 19 mmol/L — ABNORMAL LOW (ref 20–29)
CREATININE: 0.59 mg/dL (ref 0.57–1.00)
Chloride: 104 mmol/L (ref 96–106)
GFR calc Af Amer: 145 mL/min/{1.73_m2} (ref >59)
GFR calc non Af Amer: 126 mL/min/{1.73_m2} (ref >59)
Globulin, Total: 2.3 g/dL (ref 1.5–4.5)
Glucose: 85 mg/dL (ref 65–99)
Potassium: 4.3 mmol/L (ref 3.5–5.2)
Sodium: 138 mmol/L (ref 134–144)
Total Protein: 7 g/dL (ref 6.0–8.5)

## 2018-02-12 LAB — LIPID PANEL
CHOLESTEROL TOTAL: 134 mg/dL (ref 100–199)
Chol/HDL Ratio: 3.5 ratio (ref 0.0–4.4)
HDL: 38 mg/dL — ABNORMAL LOW (ref >39)
LDL CALC: 80 mg/dL (ref 0–99)
TRIGLYCERIDES: 80 mg/dL (ref 0–149)
VLDL Cholesterol Cal: 16 mg/dL (ref 5–40)

## 2018-02-20 ENCOUNTER — Telehealth: Payer: Self-pay | Admitting: Family Medicine

## 2018-02-22 ENCOUNTER — Telehealth: Payer: Self-pay | Admitting: Family Medicine

## 2018-02-22 NOTE — Telephone Encounter (Signed)
refaxed with conformation, pt aware

## 2018-03-02 ENCOUNTER — Encounter: Payer: Self-pay | Admitting: Family Medicine

## 2018-03-02 ENCOUNTER — Other Ambulatory Visit: Payer: Self-pay | Admitting: Family Medicine

## 2018-03-02 ENCOUNTER — Ambulatory Visit (INDEPENDENT_AMBULATORY_CARE_PROVIDER_SITE_OTHER): Payer: Managed Care, Other (non HMO) | Admitting: *Deleted

## 2018-03-02 DIAGNOSIS — Z3042 Encounter for surveillance of injectable contraceptive: Secondary | ICD-10-CM

## 2018-03-02 LAB — PREGNANCY, URINE: PREG TEST UR: NEGATIVE

## 2018-03-02 MED ORDER — NEOMYCIN-POLYMYXIN-HC 1 % OT SOLN: 3.0000 [drp] | Freq: Three times a day (TID) | OTIC | 0 refills | Status: AC

## 2018-03-02 MED ORDER — CIPROFLOXACIN-DEXAMETHASONE 0.3-0.1 % OT SUSP: 4.0000 [drp] | Freq: Two times a day (BID) | OTIC | 0 refills | Status: DC

## 2018-03-02 MED ORDER — MEDROXYPROGESTERONE ACETATE 150 MG/ML IM SUSP
150.0000 mg | INTRAMUSCULAR | Status: DC
  Administered 2018-03-02 – 2018-05-31 (×2): 150 mg via INTRAMUSCULAR

## 2018-03-02 NOTE — Progress Notes (Signed)
Patient evaluated in room with child.  She is been having several day history of right ear pain with decreased hearing.  Does not endorse any fevers or vomiting.

## 2018-03-02 NOTE — Progress Notes (Signed)
Pt given Medroxyprogesterone inj Tolerated well 

## 2018-03-09 ENCOUNTER — Encounter: Payer: Self-pay | Admitting: Family Medicine

## 2018-05-10 ENCOUNTER — Encounter: Payer: Self-pay | Admitting: Family Medicine

## 2018-05-31 ENCOUNTER — Ambulatory Visit (INDEPENDENT_AMBULATORY_CARE_PROVIDER_SITE_OTHER): Payer: Managed Care, Other (non HMO) | Admitting: *Deleted

## 2018-05-31 DIAGNOSIS — Z3042 Encounter for surveillance of injectable contraceptive: Secondary | ICD-10-CM

## 2018-05-31 NOTE — Progress Notes (Signed)
Pt given Medroyprogesterone inj Tolerated well

## 2018-12-27 ENCOUNTER — Other Ambulatory Visit: Payer: Self-pay | Admitting: Family Medicine

## 2019-02-01 ENCOUNTER — Other Ambulatory Visit: Payer: Self-pay | Admitting: *Deleted

## 2019-02-01 DIAGNOSIS — Z20822 Contact with and (suspected) exposure to covid-19: Secondary | ICD-10-CM

## 2019-02-03 LAB — NOVEL CORONAVIRUS, NAA: SARS-CoV-2, NAA: NOT DETECTED

## 2019-04-05 ENCOUNTER — Other Ambulatory Visit: Payer: Self-pay

## 2019-04-06 ENCOUNTER — Encounter: Payer: Self-pay | Admitting: Family Medicine

## 2019-04-06 ENCOUNTER — Ambulatory Visit (INDEPENDENT_AMBULATORY_CARE_PROVIDER_SITE_OTHER): Payer: Managed Care, Other (non HMO) | Admitting: Family Medicine

## 2019-04-06 VITALS — BP 96/63 | HR 79 | Temp 97.8°F | Ht 60.0 in | Wt 141.0 lb

## 2019-04-06 DIAGNOSIS — E663 Overweight: Secondary | ICD-10-CM | POA: Diagnosis not present

## 2019-04-06 DIAGNOSIS — Z0001 Encounter for general adult medical examination with abnormal findings: Secondary | ICD-10-CM | POA: Diagnosis not present

## 2019-04-06 DIAGNOSIS — Z0000 Encounter for general adult medical examination without abnormal findings: Secondary | ICD-10-CM

## 2019-04-06 DIAGNOSIS — Z0289 Encounter for other administrative examinations: Secondary | ICD-10-CM | POA: Diagnosis not present

## 2019-04-06 DIAGNOSIS — B351 Tinea unguium: Secondary | ICD-10-CM

## 2019-04-06 DIAGNOSIS — Z3042 Encounter for surveillance of injectable contraceptive: Secondary | ICD-10-CM | POA: Diagnosis not present

## 2019-04-06 LAB — PREGNANCY, URINE: Preg Test, Ur: NEGATIVE

## 2019-04-06 LAB — BAYER DCA HB A1C WAIVED: HB A1C (BAYER DCA - WAIVED): 5.1 % (ref <7.0)

## 2019-04-06 MED ORDER — TERBINAFINE HCL 250 MG PO TABS: 250.0000 mg | ORAL_TABLET | Freq: Every day | ORAL | 1 refills | Status: DC

## 2019-04-06 MED ORDER — MEDROXYPROGESTERONE ACETATE 150 MG/ML IM SUSY: 1.0000 mL | PREFILLED_SYRINGE | INTRAMUSCULAR | 3 refills | Status: DC

## 2019-04-06 NOTE — Progress Notes (Signed)
Gina Alvarez is a 28 y.o. female presents to office today for annual physical exam examination.    Concerns today include: 1.  Nail fungus Patient reports ongoing nail fungus in the fourth digit.  She notes thickening and discoloration of the nail.  She has used over-the-counter products with no success.  She notes that it is cosmetically unappealing and therefore she would like to proceed with oral therapies.  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 Contraception management 08/19/2012   nexplanon inserted 08/19/12 in left arm  . Irregular menstrual bleeding 09/26/2013  . Medical history non-contributory    Social History   Socioeconomic History  . Marital status: Single    Spouse name: Not on file  . Number of children: Not on file  . Years of education: Not on file  . Highest education level: Not on file  Occupational History  . Not on file  Social Needs  . Financial resource strain: Not on file  . Food insecurity    Worry: Not on file    Inability: Not on file  . Transportation needs    Medical: Not on file    Non-medical: Not on file  Tobacco Use  . Smoking status: Former Smoker    Packs/day: 0.50    Years: 8.00    Pack years: 4.00    Types: Cigarettes    Quit date: 12/26/2017    Years since quitting: 1.2  . Smokeless tobacco: Never Used  Substance and Sexual Activity  . Alcohol use: Never    Frequency: Never    Comment: maybe 1 drink per month  . Drug use: Never  . Sexual activity: Not Currently    Partners: Male    Birth control/protection: None  Lifestyle  . Physical activity    Days per week: Not on file    Minutes per session: Not on file  . Stress: Not on file   Relationships  . Social Herbalist on phone: Not on file    Gets together: Not on file    Attends religious service: Not on file    Active member of club or organization: Not on file    Attends meetings of clubs or organizations: Not on file    Relationship status: Not on file  . Intimate partner violence    Fear of current or ex partner: Not on file    Emotionally abused: Not on file    Physically abused: Not on file    Forced sexual activity: Not on file  Other Topics Concern  . Not on file  Social History Narrative  . Not on file   Past Surgical History:  Procedure Laterality Date  . NO PAST SURGERIES     Family History  Problem Relation Age of Onset  . Breast cancer Mother        dx 84-44; BRCA2+; stage III w/ chemo and radiation  . Cancer Mother   . Asthma Daughter   . Lupus Paternal Grandmother        d. 67  . Skin cancer Maternal Grandmother        "skin discoloration on arms" - not spotty  . Mental illness Maternal Aunt  Current Outpatient Medications:  .  fluticasone (FLONASE) 50 MCG/ACT nasal spray, USE 2 SPRAY(S) IN EACH NOSTRIL ONCE DAILY, Disp: , Rfl:   Current Facility-Administered Medications:  .  medroxyPROGESTERone (DEPO-PROVERA) injection 150 mg, 150 mg, Intramuscular, Q90 days, Gottschalk, Ashly M, DO, 150 mg at 05/31/18 1530  No Known Allergies   ROS: Review of Systems Constitutional: negative Eyes: positive for contacts/glasses Ears, nose, mouth, throat, and face: positive for hearing loss Respiratory: negative Cardiovascular: negative Gastrointestinal: negative Genitourinary:negative Integument/breast: negative Hematologic/lymphatic: negative Musculoskeletal:negative Neurological: negative Behavioral/Psych: negative Endocrine: negative Allergic/Immunologic: negative    Physical exam BP 96/63   Pulse 79   Temp 97.8 F (36.6 C) (Temporal)   Ht 5' (1.524 m)   Wt 141 lb (64 kg)   SpO2 99%   BMI 27.54 kg/m  General  appearance: alert, cooperative, appears stated age and no distress Head: Normocephalic, without obvious abnormality, atraumatic Eyes: negative findings: lids and lashes normal, conjunctivae and sclerae normal, corneas clear and pupils equal, round, reactive to light and accomodation Ears: normal TM's and external ear canals both ears Nose: Nares normal. Septum midline. Mucosa normal. No drainage or sinus tenderness. Throat: lips, mucosa, and tongue normal; teeth and gums normal Neck: no adenopathy, no carotid bruit, supple, symmetrical, trachea midline and thyroid not enlarged, symmetric, no tenderness/mass/nodules Back: symmetric, no curvature. ROM normal. No CVA tenderness. Lungs: clear to auscultation bilaterally Heart: regular rate and rhythm, S1, S2 normal, no murmur, click, rub or gallop Abdomen: soft, non-tender; bowel sounds normal; no masses,  no organomegaly Extremities: extremities normal, atraumatic, no cyanosis or edema Pulses: 2+ and symmetric Skin: Skin color, texture, turgor normal. No rashes or lesions or 4th digit with onychomycotic changes to nail Lymph nodes: Cervical, supraclavicular, and axillary nodes normal. Neurologic: Alert and oriented X 3, normal strength and tone. Normal symmetric reflexes. Normal coordination and gait Psych: Mood stable, speech normal, affect appropriate, pleasant and interactive Depression screen [facility] 2/9 04/06/2019 02/10/2018 11/30/2017  Decreased Interest 0 0 0  Down, Depressed, Hopeless 0 0 0  PHQ - 2 Score 0 0 0  Altered sleeping 0 - -  Tired, decreased energy 0 - -  Change in appetite 0 - -  Feeling bad or failure about yourself  0 - -  Trouble concentrating 0 - -  Moving slowly or fidgety/restless 0 - -  Suicidal thoughts 0 - -  PHQ-9 Score 0 - -   Assessment/ Plan: Gina Alvarez here for annual physical exam.   1. Well woman exam (no gynecological exam) Not due for Pap smear until June  2. Encounter for surveillance of  injectable contraceptive Urine pregnancy obtained.  She will return with the injectable in the next 24 hours. - Pregnancy, urine - medroxyPROGESTERone Acetate 150 MG/ML SUSY; Inject 1 mL (150 mg total) into the muscle every 3 (three) months.  Dispense: 1 mL; Refill: 3  3. Examination, physical, employee Employee physical form completed.  Awaiting lab results that are requested by employer.  She will pick up the form tomorrow - CMP14+EGFR - Lipid Panel - Bayer DCA Hb A1c Waived  4. Overweight (BMI 25.0-29.9) - CMP14+EGFR - Lipid Panel - Bayer DCA Hb A1c Waived  5. Onychomycosis We discussed treatment therapies.  She has failed the topical over-the-counter therapy.  We will treat with Lamisil.  Discussed the risks of the medication.  She will return in 6 weeks for laboratory check.  Future CMP placed.  Refill pending and normal LFTs. - terbinafine (LAMISIL) 250 MG  tablet; Take 1 tablet (250 mg total) by mouth daily. Return in 6 weeks for labs.  Dispense: 30 tablet; Refill: 1 - CMP14+EGFR; Future   Counseled on healthy lifestyle choices, including diet (rich in fruits, vegetables and lean meats and low in salt and simple carbohydrates) and exercise (at least 30 minutes of moderate physical activity daily).  Patient to follow up in 1 year for annual exam or sooner if needed.  Ashly M. Lajuana Ripple, DO

## 2019-04-06 NOTE — Patient Instructions (Signed)
Health Maintenance, Female Adopting a healthy lifestyle and getting preventive care are important in promoting health and wellness. Ask your health care provider about:  The right schedule for you to have regular tests and exams.  Things you can do on your own to prevent diseases and keep yourself healthy. What should I know about diet, weight, and exercise? Eat a healthy diet   Eat a diet that includes plenty of vegetables, fruits, low-fat dairy products, and lean protein.  Do not eat a lot of foods that are high in solid fats, added sugars, or sodium. Maintain a healthy weight Body mass index (BMI) is used to identify weight problems. It estimates body fat based on height and weight. Your health care provider can help determine your BMI and help you achieve or maintain a healthy weight. Get regular exercise Get regular exercise. This is one of the most important things you can do for your health. Most adults should:  Exercise for at least 150 minutes each week. The exercise should increase your heart rate and make you sweat (moderate-intensity exercise).  Do strengthening exercises at least twice a week. This is in addition to the moderate-intensity exercise.  Spend less time sitting. Even light physical activity can be beneficial. Watch cholesterol and blood lipids Have your blood tested for lipids and cholesterol at 28 years of age, then have this test every 5 years. Have your cholesterol levels checked more often if:  Your lipid or cholesterol levels are high.  You are older than 28 years of age.  You are at high risk for heart disease. What should I know about cancer screening? Depending on your health history and family history, you may need to have cancer screening at various ages. This may include screening for:  Breast cancer.  Cervical cancer.  Colorectal cancer.  Skin cancer.  Lung cancer. What should I know about heart disease, diabetes, and high blood  pressure? Blood pressure and heart disease  High blood pressure causes heart disease and increases the risk of stroke. This is more likely to develop in people who have high blood pressure readings, are of African descent, or are overweight.  Have your blood pressure checked: ? Every 3-5 years if you are 18-39 years of age. ? Every year if you are 40 years old or older. Diabetes Have regular diabetes screenings. This checks your fasting blood sugar level. Have the screening done:  Once every three years after age 40 if you are at a normal weight and have a low risk for diabetes.  More often and at a younger age if you are overweight or have a high risk for diabetes. What should I know about preventing infection? Hepatitis B If you have a higher risk for hepatitis B, you should be screened for this virus. Talk with your health care provider to find out if you are at risk for hepatitis B infection. Hepatitis C Testing is recommended for:  Everyone born from 1945 through 1965.  Anyone with known risk factors for hepatitis C. Sexually transmitted infections (STIs)  Get screened for STIs, including gonorrhea and chlamydia, if: ? You are sexually active and are younger than 28 years of age. ? You are older than 28 years of age and your health care provider tells you that you are at risk for this type of infection. ? Your sexual activity has changed since you were last screened, and you are at increased risk for chlamydia or gonorrhea. Ask your health care provider if   you are at risk.  Ask your health care provider about whether you are at high risk for HIV. Your health care provider may recommend a prescription medicine to help prevent HIV infection. If you choose to take medicine to prevent HIV, you should first get tested for HIV. You should then be tested every 3 months for as long as you are taking the medicine. Pregnancy  If you are about to stop having your period (premenopausal) and  you may become pregnant, seek counseling before you get pregnant.  Take 400 to 800 micrograms (mcg) of folic acid every day if you become pregnant.  Ask for birth control (contraception) if you want to prevent pregnancy. Osteoporosis and menopause Osteoporosis is a disease in which the bones lose minerals and strength with aging. This can result in bone fractures. If you are 65 years old or older, or if you are at risk for osteoporosis and fractures, ask your health care provider if you should:  Be screened for bone loss.  Take a calcium or vitamin D supplement to lower your risk of fractures.  Be given hormone replacement therapy (HRT) to treat symptoms of menopause. Follow these instructions at home: Lifestyle  Do not use any products that contain nicotine or tobacco, such as cigarettes, e-cigarettes, and chewing tobacco. If you need help quitting, ask your health care provider.  Do not use street drugs.  Do not share needles.  Ask your health care provider for help if you need support or information about quitting drugs. Alcohol use  Do not drink alcohol if: ? Your health care provider tells you not to drink. ? You are pregnant, may be pregnant, or are planning to become pregnant.  If you drink alcohol: ? Limit how much you use to 0-1 drink a day. ? Limit intake if you are breastfeeding.  Be aware of how much alcohol is in your drink. In the [place] one drink equals one 12 oz bottle of beer (355 mL), one 5 oz glass of wine (148 mL), or one 1 oz glass of hard liquor (44 mL). General instructions  Schedule regular health, dental, and eye exams.  Stay current with your vaccines.  Tell your health care provider if: ? You often feel depressed. ? You have ever been abused or do not feel safe at home. Summary  Adopting a healthy lifestyle and getting preventive care are important in promoting health and wellness.  Follow your health care provider's instructions about healthy  diet, exercising, and getting tested or screened for diseases.  Follow your health care provider's instructions on monitoring your cholesterol and blood pressure. This information is not intended to replace advice given to you by your health care provider. Make sure you discuss any questions you have with your health care provider. Document Released: 11/25/2010 Document Revised: 05/05/2018 Document Reviewed: 05/05/2018 Elsevier Patient Education  2020 Elsevier Inc.  

## 2019-04-07 ENCOUNTER — Ambulatory Visit (INDEPENDENT_AMBULATORY_CARE_PROVIDER_SITE_OTHER): Payer: Managed Care, Other (non HMO)

## 2019-04-07 DIAGNOSIS — Z3042 Encounter for surveillance of injectable contraceptive: Secondary | ICD-10-CM

## 2019-04-07 LAB — CMP14+EGFR
ALT: 8 IU/L (ref 0–32)
AST: 15 IU/L (ref 0–40)
Albumin/Globulin Ratio: 2.5 — ABNORMAL HIGH (ref 1.2–2.2)
Albumin: 4.7 g/dL (ref 3.9–5.0)
Alkaline Phosphatase: 57 IU/L (ref 39–117)
BUN/Creatinine Ratio: 15 (ref 9–23)
BUN: 9 mg/dL (ref 6–20)
Bilirubin Total: 0.5 mg/dL (ref 0.0–1.2)
CO2: 22 mmol/L (ref 20–29)
Calcium: 9.4 mg/dL (ref 8.7–10.2)
Chloride: 105 mmol/L (ref 96–106)
Creatinine, Ser: 0.6 mg/dL (ref 0.57–1.00)
GFR calc Af Amer: 143 mL/min/{1.73_m2} (ref >59)
GFR calc non Af Amer: 124 mL/min/{1.73_m2} (ref >59)
Globulin, Total: 1.9 g/dL (ref 1.5–4.5)
Glucose: 84 mg/dL (ref 65–99)
Potassium: 4.4 mmol/L (ref 3.5–5.2)
Sodium: 140 mmol/L (ref 134–144)
Total Protein: 6.6 g/dL (ref 6.0–8.5)

## 2019-04-07 LAB — LIPID PANEL
Chol/HDL Ratio: 3.4 ratio (ref 0.0–4.4)
Cholesterol, Total: 124 mg/dL (ref 100–199)
HDL: 37 mg/dL — ABNORMAL LOW (ref >39)
LDL Chol Calc (NIH): 74 mg/dL (ref 0–99)
Triglycerides: 57 mg/dL (ref 0–149)
VLDL Cholesterol Cal: 13 mg/dL (ref 5–40)

## 2019-04-07 MED ORDER — MEDROXYPROGESTERONE ACETATE 150 MG/ML IM SUSP
150.0000 mg | INTRAMUSCULAR | Status: AC
  Administered 2019-04-07 – 2021-01-11 (×6): 150 mg via INTRAMUSCULAR

## 2019-04-07 NOTE — Progress Notes (Signed)
Depo Provera shot to right upper outer quadrant.  Patient tolerated well.

## 2019-04-19 ENCOUNTER — Ambulatory Visit: Payer: Managed Care, Other (non HMO)

## 2019-06-24 ENCOUNTER — Other Ambulatory Visit: Payer: Self-pay

## 2019-06-27 ENCOUNTER — Other Ambulatory Visit: Payer: Self-pay

## 2019-06-27 ENCOUNTER — Ambulatory Visit (INDEPENDENT_AMBULATORY_CARE_PROVIDER_SITE_OTHER): Payer: Managed Care, Other (non HMO) | Admitting: *Deleted

## 2019-06-27 DIAGNOSIS — Z3042 Encounter for surveillance of injectable contraceptive: Secondary | ICD-10-CM | POA: Diagnosis not present

## 2019-09-13 ENCOUNTER — Telehealth: Payer: Self-pay | Admitting: Family Medicine

## 2019-09-13 NOTE — Telephone Encounter (Signed)
Lmtcb.

## 2019-09-15 ENCOUNTER — Ambulatory Visit: Payer: Managed Care, Other (non HMO)

## 2019-09-22 NOTE — Telephone Encounter (Signed)
Never received call back will close encounter

## 2019-10-20 ENCOUNTER — Other Ambulatory Visit: Payer: Self-pay

## 2019-10-20 ENCOUNTER — Ambulatory Visit (INDEPENDENT_AMBULATORY_CARE_PROVIDER_SITE_OTHER): Payer: Managed Care, Other (non HMO) | Admitting: *Deleted

## 2019-10-20 DIAGNOSIS — Z3042 Encounter for surveillance of injectable contraceptive: Secondary | ICD-10-CM

## 2019-10-20 LAB — PREGNANCY, URINE: Preg Test, Ur: NEGATIVE

## 2019-10-20 NOTE — Progress Notes (Signed)
Pt was outside of 3 month window for Depo-Provera shot. Urine pregnancy test performed and it was negative. Injection given with no adverse affects

## 2020-01-09 ENCOUNTER — Ambulatory Visit: Payer: Managed Care, Other (non HMO)

## 2020-01-13 ENCOUNTER — Other Ambulatory Visit: Payer: Self-pay

## 2020-01-13 ENCOUNTER — Ambulatory Visit (INDEPENDENT_AMBULATORY_CARE_PROVIDER_SITE_OTHER): Payer: Managed Care, Other (non HMO) | Admitting: *Deleted

## 2020-01-13 DIAGNOSIS — Z3042 Encounter for surveillance of injectable contraceptive: Secondary | ICD-10-CM | POA: Diagnosis not present

## 2020-01-20 ENCOUNTER — Ambulatory Visit: Payer: Managed Care, Other (non HMO)

## 2020-01-31 DIAGNOSIS — Z20828 Contact with and (suspected) exposure to other viral communicable diseases: Secondary | ICD-10-CM | POA: Diagnosis not present

## 2020-02-14 ENCOUNTER — Encounter (HOSPITAL_COMMUNITY)
Admission: RE | Disposition: A | Discharge status: Home / Self Care | Payer: Self-pay | Source: Ambulatory Visit | Attending: Orthopedic Surgery

## 2020-02-14 ENCOUNTER — Inpatient Hospital Stay (HOSPITAL_COMMUNITY): Payer: Self-pay | Admitting: Anesthesiology

## 2020-02-14 ENCOUNTER — Encounter (HOSPITAL_COMMUNITY): Payer: Self-pay | Admitting: *Deleted

## 2020-02-14 ENCOUNTER — Ambulatory Visit (HOSPITAL_COMMUNITY)
Admission: RE | Admit: 2020-02-14 | Discharge: 2020-02-14 | Disposition: A | Discharge status: Home / Self Care | Payer: Self-pay | Source: Ambulatory Visit | Attending: Orthopedic Surgery | Admitting: Orthopedic Surgery

## 2020-02-14 ENCOUNTER — Encounter (HOSPITAL_COMMUNITY): Admission: EM | Disposition: A | Discharge status: Home / Self Care | Payer: Self-pay | Source: Home / Self Care

## 2020-02-14 ENCOUNTER — Encounter (HOSPITAL_COMMUNITY): Payer: Self-pay | Admitting: Orthopedic Surgery

## 2020-02-14 ENCOUNTER — Ambulatory Visit (HOSPITAL_COMMUNITY)
Admission: EM | Admit: 2020-02-14 | Discharge: 2020-02-14 | Disposition: A | Discharge status: Home / Self Care | Payer: HRSA Program | Attending: Physician Assistant | Admitting: Physician Assistant

## 2020-02-14 ENCOUNTER — Ambulatory Visit (INDEPENDENT_AMBULATORY_CARE_PROVIDER_SITE_OTHER): Payer: HRSA Program

## 2020-02-14 ENCOUNTER — Other Ambulatory Visit: Payer: Self-pay

## 2020-02-14 DIAGNOSIS — Z809 Family history of malignant neoplasm, unspecified: Secondary | ICD-10-CM | POA: Insufficient documentation

## 2020-02-14 DIAGNOSIS — Z87891 Personal history of nicotine dependence: Secondary | ICD-10-CM | POA: Insufficient documentation

## 2020-02-14 DIAGNOSIS — Z793 Long term (current) use of hormonal contraceptives: Secondary | ICD-10-CM | POA: Insufficient documentation

## 2020-02-14 DIAGNOSIS — Z803 Family history of malignant neoplasm of breast: Secondary | ICD-10-CM | POA: Insufficient documentation

## 2020-02-14 DIAGNOSIS — Y9389 Activity, other specified: Secondary | ICD-10-CM | POA: Insufficient documentation

## 2020-02-14 DIAGNOSIS — Z808 Family history of malignant neoplasm of other organs or systems: Secondary | ICD-10-CM | POA: Insufficient documentation

## 2020-02-14 DIAGNOSIS — S62639B Displaced fracture of distal phalanx of unspecified finger, initial encounter for open fracture: Secondary | ICD-10-CM | POA: Insufficient documentation

## 2020-02-14 DIAGNOSIS — S62634B Displaced fracture of distal phalanx of right ring finger, initial encounter for open fracture: Secondary | ICD-10-CM | POA: Insufficient documentation

## 2020-02-14 DIAGNOSIS — W230XXA Caught, crushed, jammed, or pinched between moving objects, initial encounter: Secondary | ICD-10-CM | POA: Insufficient documentation

## 2020-02-14 DIAGNOSIS — Z8489 Family history of other specified conditions: Secondary | ICD-10-CM | POA: Insufficient documentation

## 2020-02-14 DIAGNOSIS — Y990 Civilian activity done for income or pay: Secondary | ICD-10-CM | POA: Insufficient documentation

## 2020-02-14 DIAGNOSIS — Z79899 Other long term (current) drug therapy: Secondary | ICD-10-CM | POA: Insufficient documentation

## 2020-02-14 DIAGNOSIS — Z818 Family history of other mental and behavioral disorders: Secondary | ICD-10-CM | POA: Insufficient documentation

## 2020-02-14 DIAGNOSIS — Z20822 Contact with and (suspected) exposure to covid-19: Secondary | ICD-10-CM | POA: Insufficient documentation

## 2020-02-14 DIAGNOSIS — W3189XA Contact with other specified machinery, initial encounter: Secondary | ICD-10-CM | POA: Insufficient documentation

## 2020-02-14 DIAGNOSIS — S6710XA Crushing injury of unspecified finger(s), initial encounter: Secondary | ICD-10-CM | POA: Diagnosis present

## 2020-02-14 DIAGNOSIS — Z825 Family history of asthma and other chronic lower respiratory diseases: Secondary | ICD-10-CM | POA: Insufficient documentation

## 2020-02-14 DIAGNOSIS — S6991XA Unspecified injury of right wrist, hand and finger(s), initial encounter: Secondary | ICD-10-CM

## 2020-02-14 HISTORY — PX: I & D EXTREMITY: SHX5045

## 2020-02-14 HISTORY — PX: PERCUTANEOUS PINNING: SHX2209

## 2020-02-14 LAB — SARS CORONAVIRUS 2 BY RT PCR (HOSPITAL ORDER, PERFORMED IN ~~LOC~~ HOSPITAL LAB): SARS Coronavirus 2: NEGATIVE

## 2020-02-14 LAB — POCT PREGNANCY, URINE: Preg Test, Ur: NEGATIVE

## 2020-02-14 SURGERY — IRRIGATION AND DEBRIDEMENT EXTREMITY
Anesthesia: Choice | Laterality: Right

## 2020-02-14 SURGERY — IRRIGATION AND DEBRIDEMENT EXTREMITY
Anesthesia: General | Laterality: Right

## 2020-02-14 MED ORDER — ROCURONIUM BROMIDE 10 MG/ML (PF) SYRINGE
PREFILLED_SYRINGE | INTRAVENOUS | Status: DC | PRN
  Administered 2020-02-14: 70 mg via INTRAVENOUS

## 2020-02-14 MED ORDER — ARTIFICIAL TEARS OPHTHALMIC OINT
TOPICAL_OINTMENT | OPHTHALMIC | Status: AC
  Filled 2020-02-14: qty 14

## 2020-02-14 MED ORDER — OXYCODONE HCL 5 MG/5ML PO SOLN: 5.0000 mg | Freq: Once | ORAL | Status: DC | PRN

## 2020-02-14 MED ORDER — DEXAMETHASONE SODIUM PHOSPHATE 10 MG/ML IJ SOLN
INTRAMUSCULAR | Status: DC | PRN
  Administered 2020-02-14: 4 mg via INTRAVENOUS

## 2020-02-14 MED ORDER — 0.9 % SODIUM CHLORIDE (POUR BTL) OPTIME
TOPICAL | Status: DC | PRN
  Administered 2020-02-14: 1000 mL

## 2020-02-14 MED ORDER — CHLORHEXIDINE GLUCONATE 0.12 % MT SOLN: 15.0000 mL | Freq: Once | OROMUCOSAL | Status: AC

## 2020-02-14 MED ORDER — LACTATED RINGERS IV SOLN: INTRAVENOUS | Status: DC

## 2020-02-14 MED ORDER — ORAL CARE MOUTH RINSE: 15.0000 mL | Freq: Once | OROMUCOSAL | Status: AC

## 2020-02-14 MED ORDER — BUPIVACAINE HCL (PF) 0.25 % IJ SOLN
INTRAMUSCULAR | Status: AC
  Filled 2020-02-14: qty 20

## 2020-02-14 MED ORDER — FENTANYL CITRATE (PF) 250 MCG/5ML IJ SOLN
INTRAMUSCULAR | Status: AC
  Filled 2020-02-14: qty 5

## 2020-02-14 MED ORDER — OXYCODONE HCL 5 MG PO TABS: 5.0000 mg | ORAL_TABLET | Freq: Once | ORAL | Status: DC | PRN

## 2020-02-14 MED ORDER — DEXAMETHASONE SODIUM PHOSPHATE 10 MG/ML IJ SOLN
INTRAMUSCULAR | Status: AC
  Filled 2020-02-14: qty 1

## 2020-02-14 MED ORDER — SODIUM CHLORIDE 0.9 % IR SOLN
Status: DC | PRN
  Administered 2020-02-14: 1000 mL

## 2020-02-14 MED ORDER — MIDAZOLAM HCL 5 MG/5ML IJ SOLN
INTRAMUSCULAR | Status: DC | PRN
  Administered 2020-02-14: 2 mg via INTRAVENOUS

## 2020-02-14 MED ORDER — HYDROCODONE-ACETAMINOPHEN 5-325 MG PO TABS
ORAL_TABLET | ORAL | 0 refills | Status: DC
Start: 2020-02-14 — End: 2020-06-05

## 2020-02-14 MED ORDER — CHLORHEXIDINE GLUCONATE 0.12 % MT SOLN
OROMUCOSAL | Status: AC
  Administered 2020-02-14: 15 mL via OROMUCOSAL
  Filled 2020-02-14: qty 15

## 2020-02-14 MED ORDER — FENTANYL CITRATE (PF) 100 MCG/2ML IJ SOLN
INTRAMUSCULAR | Status: DC | PRN
Start: 2020-02-14 — End: 2020-02-14
  Administered 2020-02-14: 150 ug via INTRAVENOUS

## 2020-02-14 MED ORDER — SULFAMETHOXAZOLE-TRIMETHOPRIM 800-160 MG PO TABS: 1.0000 | ORAL_TABLET | Freq: Two times a day (BID) | ORAL | 0 refills | Status: DC

## 2020-02-14 MED ORDER — SUCCINYLCHOLINE CHLORIDE 200 MG/10ML IV SOSY
PREFILLED_SYRINGE | INTRAVENOUS | Status: AC
  Filled 2020-02-14: qty 10

## 2020-02-14 MED ORDER — CEFAZOLIN SODIUM-DEXTROSE 2-3 GM-%(50ML) IV SOLR
INTRAVENOUS | Status: DC | PRN
  Administered 2020-02-14: 2 g via INTRAVENOUS

## 2020-02-14 MED ORDER — PHENYLEPHRINE 40 MCG/ML (10ML) SYRINGE FOR IV PUSH (FOR BLOOD PRESSURE SUPPORT)
PREFILLED_SYRINGE | INTRAVENOUS | Status: DC | PRN
  Administered 2020-02-14 (×2): 80 ug via INTRAVENOUS

## 2020-02-14 MED ORDER — HYDROCODONE-ACETAMINOPHEN 5-325 MG PO TABS
1.0000 | ORAL_TABLET | Freq: Once | ORAL | Status: AC
  Administered 2020-02-14: 1 via ORAL

## 2020-02-14 MED ORDER — BUPIVACAINE HCL (PF) 0.25 % IJ SOLN
INTRAMUSCULAR | Status: DC | PRN
  Administered 2020-02-14: 10 mL

## 2020-02-14 MED ORDER — HYDROCODONE-ACETAMINOPHEN 5-325 MG PO TABS
ORAL_TABLET | ORAL | Status: AC
  Filled 2020-02-14: qty 1

## 2020-02-14 MED ORDER — PROPOFOL 10 MG/ML IV BOLUS
INTRAVENOUS | Status: DC | PRN
  Administered 2020-02-14: 130 mg via INTRAVENOUS

## 2020-02-14 MED ORDER — SUGAMMADEX SODIUM 200 MG/2ML IV SOLN
INTRAVENOUS | Status: DC | PRN
  Administered 2020-02-14: 200 mg via INTRAVENOUS

## 2020-02-14 MED ORDER — ONDANSETRON HCL 4 MG/2ML IJ SOLN: 4.0000 mg | Freq: Four times a day (QID) | INTRAMUSCULAR | Status: DC | PRN

## 2020-02-14 MED ORDER — CEFAZOLIN SODIUM-DEXTROSE 2-4 GM/100ML-% IV SOLN
INTRAVENOUS | Status: AC
  Filled 2020-02-14: qty 100

## 2020-02-14 MED ORDER — PHENYLEPHRINE 40 MCG/ML (10ML) SYRINGE FOR IV PUSH (FOR BLOOD PRESSURE SUPPORT)
PREFILLED_SYRINGE | INTRAVENOUS | Status: AC
  Filled 2020-02-14: qty 10

## 2020-02-14 MED ORDER — BUPIVACAINE HCL (PF) 0.5 % IJ SOLN
INTRAMUSCULAR | Status: AC
  Filled 2020-02-14: qty 10

## 2020-02-14 MED ORDER — ONDANSETRON HCL 4 MG/2ML IJ SOLN
INTRAMUSCULAR | Status: DC | PRN
  Administered 2020-02-14: 4 mg via INTRAVENOUS

## 2020-02-14 MED ORDER — LIDOCAINE 2% (20 MG/ML) 5 ML SYRINGE
INTRAMUSCULAR | Status: DC | PRN
  Administered 2020-02-14: 100 mg via INTRAVENOUS

## 2020-02-14 MED ORDER — ROCURONIUM BROMIDE 10 MG/ML (PF) SYRINGE
PREFILLED_SYRINGE | INTRAVENOUS | Status: AC
  Filled 2020-02-14: qty 30

## 2020-02-14 MED ORDER — FENTANYL CITRATE (PF) 100 MCG/2ML IJ SOLN: 25.0000 ug | INTRAMUSCULAR | Status: DC | PRN

## 2020-02-14 SURGICAL SUPPLY — 71 items
ADAPTER CATH SYR TO TUBING 38M (ADAPTER) ×3 IMPLANT
ADPR CATH LL SYR 3/32 TPR (ADAPTER) ×1
APL PRP STRL LF DISP 70% ISPRP (MISCELLANEOUS) ×1
APL SKNCLS STERI-STRIP NONHPOA (GAUZE/BANDAGES/DRESSINGS) ×1
BENZOIN TINCTURE PRP APPL 2/3 (GAUZE/BANDAGES/DRESSINGS) ×3 IMPLANT
BLADE CLIPPER SURG (BLADE) ×3 IMPLANT
BNDG CMPR 9X4 STRL LF SNTH (GAUZE/BANDAGES/DRESSINGS) ×1
BNDG COHESIVE 1X5 TAN STRL LF (GAUZE/BANDAGES/DRESSINGS) ×2 IMPLANT
BNDG COHESIVE 2X5 TAN STRL LF (GAUZE/BANDAGES/DRESSINGS) IMPLANT
BNDG ELASTIC 3X5.8 VLCR STR LF (GAUZE/BANDAGES/DRESSINGS) ×3 IMPLANT
BNDG ELASTIC 4X5.8 VLCR STR LF (GAUZE/BANDAGES/DRESSINGS) ×3 IMPLANT
BNDG ESMARK 4X9 LF (GAUZE/BANDAGES/DRESSINGS) ×3 IMPLANT
BNDG GAUZE ELAST 4 BULKY (GAUZE/BANDAGES/DRESSINGS) ×3 IMPLANT
CHLORAPREP W/TINT 26 (MISCELLANEOUS) ×3 IMPLANT
CLOSURE WOUND 1/2 X4 (GAUZE/BANDAGES/DRESSINGS) ×1
CORD BIPOLAR FORCEPS 12FT (ELECTRODE) ×3 IMPLANT
COVER SURGICAL LIGHT HANDLE (MISCELLANEOUS) ×3 IMPLANT
COVER WAND RF STERILE (DRAPES) ×3 IMPLANT
CUFF TOURN SGL QUICK 18X4 (TOURNIQUET CUFF) IMPLANT
CUFF TOURN SGL QUICK 24 (TOURNIQUET CUFF)
CUFF TRNQT CYL 24X4X16.5-23 (TOURNIQUET CUFF) IMPLANT
DECANTER SPIKE VIAL GLASS SM (MISCELLANEOUS) ×3 IMPLANT
DRAIN PENROSE 1/4X12 LTX STRL (WOUND CARE) IMPLANT
DRAPE OEC MINIVIEW 54X84 (DRAPES) ×6 IMPLANT
DRAPE SURG 17X11 SM STRL (DRAPES) ×2 IMPLANT
DRAPE SURG 17X23 STRL (DRAPES) ×3 IMPLANT
DRSG EMULSION OIL 3X3 NADH (GAUZE/BANDAGES/DRESSINGS) ×3 IMPLANT
DRSG PAD ABDOMINAL 8X10 ST (GAUZE/BANDAGES/DRESSINGS) ×6 IMPLANT
DRSG XEROFORM 1X8 (GAUZE/BANDAGES/DRESSINGS) ×2 IMPLANT
GAUZE SPONGE 4X4 12PLY STRL (GAUZE/BANDAGES/DRESSINGS) ×3 IMPLANT
GAUZE XEROFORM 1X8 LF (GAUZE/BANDAGES/DRESSINGS) ×3 IMPLANT
GLOVE BIO SURGEON STRL SZ7.5 (GLOVE) ×3 IMPLANT
GLOVE BIOGEL PI IND STRL 8 (GLOVE) ×1 IMPLANT
GLOVE BIOGEL PI INDICATOR 8 (GLOVE) ×2
GOWN STRL REUS W/ TWL LRG LVL3 (GOWN DISPOSABLE) ×3 IMPLANT
GOWN STRL REUS W/ TWL XL LVL3 (GOWN DISPOSABLE) ×1 IMPLANT
GOWN STRL REUS W/TWL LRG LVL3 (GOWN DISPOSABLE) ×9
GOWN STRL REUS W/TWL XL LVL3 (GOWN DISPOSABLE) ×3
KIT BASIN OR (CUSTOM PROCEDURE TRAY) ×3 IMPLANT
KIT TURNOVER KIT B (KITS) ×3 IMPLANT
LOOP VESSEL MAXI BLUE (MISCELLANEOUS) IMPLANT
MANIFOLD NEPTUNE II (INSTRUMENTS) ×3 IMPLANT
NDL HYPO 25GX1X1/2 BEV (NEEDLE) ×1 IMPLANT
NDL HYPO 25X1 1.5 SAFETY (NEEDLE) IMPLANT
NEEDLE HYPO 25GX1X1/2 BEV (NEEDLE) ×3 IMPLANT
NEEDLE HYPO 25X1 1.5 SAFETY (NEEDLE) IMPLANT
NS IRRIG 1000ML POUR BTL (IV SOLUTION) ×3 IMPLANT
PACK ORTHO EXTREMITY (CUSTOM PROCEDURE TRAY) ×3 IMPLANT
PAD ARMBOARD 7.5X6 YLW CONV (MISCELLANEOUS) ×6 IMPLANT
SET CYSTO W/LG BORE CLAMP LF (SET/KITS/TRAYS/PACK) ×2 IMPLANT
SOL PREP POV-IOD 4OZ 10% (MISCELLANEOUS) ×6 IMPLANT
SPONGE LAP 4X18 RFD (DISPOSABLE) ×3 IMPLANT
STRIP CLOSURE SKIN 1/2X4 (GAUZE/BANDAGES/DRESSINGS) ×2 IMPLANT
SUCTION FRAZIER HANDLE 10FR (MISCELLANEOUS) ×3
SUCTION TUBE FRAZIER 10FR DISP (MISCELLANEOUS) ×1 IMPLANT
SUT ETHILON 4 0 P 3 18 (SUTURE) IMPLANT
SUT ETHILON 4 0 PS 2 18 (SUTURE) IMPLANT
SUT MON AB 5-0 P3 18 (SUTURE) IMPLANT
SUT PROLENE 4 0 P 3 18 (SUTURE) IMPLANT
SWAB COLLECTION DEVICE MRSA (MISCELLANEOUS) IMPLANT
SWAB CULTURE ESWAB REG 1ML (MISCELLANEOUS) ×2 IMPLANT
SYR 20ML LL LF (SYRINGE) ×3 IMPLANT
SYR CONTROL 10ML LL (SYRINGE) ×3 IMPLANT
TOWEL GREEN STERILE (TOWEL DISPOSABLE) ×3 IMPLANT
TOWEL GREEN STERILE FF (TOWEL DISPOSABLE) ×3 IMPLANT
TUBE CONNECTING 12'X1/4 (SUCTIONS) ×1
TUBE CONNECTING 12X1/4 (SUCTIONS) ×2 IMPLANT
TUBE FEEDING ENTERAL 5FR 16IN (TUBING) IMPLANT
UNDERPAD 30X36 HEAVY ABSORB (UNDERPADS AND DIAPERS) ×3 IMPLANT
WATER STERILE IRR 1000ML POUR (IV SOLUTION) ×3 IMPLANT
YANKAUER SUCT BULB TIP NO VENT (SUCTIONS) ×3 IMPLANT

## 2020-02-14 NOTE — Discharge Instructions (Signed)
Go to Admissions at [facility]

## 2020-02-14 NOTE — Anesthesia Postprocedure Evaluation (Signed)
Anesthesia Post Note  Patient: Gina Alvarez  Procedure(s) Performed: IRRIGATION AND DEBRIDEMENT RIGHT RING FINGER WITH NAILBED REPAIR (Right ) PERCUTANEOUS PINNING EXTREMITY (Right )     Patient location during evaluation: PACU Anesthesia Type: General Level of consciousness: awake and alert Pain management: pain level controlled Vital Signs Assessment: post-procedure vital signs reviewed and stable Respiratory status: spontaneous breathing, nonlabored ventilation, respiratory function stable and patient connected to nasal cannula oxygen Cardiovascular status: blood pressure returned to baseline and stable Postop Assessment: no apparent nausea or vomiting Anesthetic complications: no   No complications documented.  Last Vitals:  Vitals:   02/14/20 1930 02/14/20 1945  BP: 118/78   Pulse: 97   Resp: 11   Temp:  (!) 36.2 C  SpO2: 97%     Last Pain:  Vitals:   02/14/20 1945  TempSrc:   PainSc: [place]

## 2020-02-14 NOTE — ED Provider Notes (Signed)
[facility]    CSN: [id number] Arrival date & time: 02/14/20  1416      History   Chief Complaint Chief Complaint  Patient presents with  . Finger Injury    HPI Gina Alvarez is a 29 y.o. female.   Patient reports for pneumatic staple press injury to her right ring finger.  She reports she was at work on her right ring finger got caught in a staple machine.  She reports staple went through the finger and is stuck in the finger.  She reports the staple also smashed her finger.     Past Medical History:  Diagnosis Date  . Contraception management 08/19/2012   nexplanon inserted 08/19/12 in left arm  . Irregular menstrual bleeding 09/26/2013  . Medical history non-contributory   . Nexplanon insertion 06/25/2017   06/25/17   Removed 08/25/17 d/t possible infeciton    Patient Active Problem List   Diagnosis Date Noted  . FHx: BRCA gene positive 10/21/2016  . Urinary retention 06/14/2012    Past Surgical History:  Procedure Laterality Date  . NO PAST SURGERIES      OB History    Gravida  3   Para  2   Term  2   Preterm  0   AB  1   Living  2     SAB  1   TAB  0   Ectopic  0   Multiple  0   Live Births  2            Home Medications    Prior to Admission medications   Medication Sig Start Date End Date Taking? Authorizing Provider  fluticasone (FLONASE) 50 MCG/ACT nasal spray USE 2 SPRAY(S) IN EACH NOSTRIL ONCE DAILY 01/26/19   [provider]  medroxyPROGESTERone Acetate 150 MG/ML SUSY Inject 1 mL (150 mg total) into the muscle every 3 (three) months. 04/06/19   Janora Norlander, DO  terbinafine (LAMISIL) 250 MG tablet Take 1 tablet (250 mg total) by mouth daily. Return in 6 weeks for labs. 04/06/19   Janora Norlander, DO    Family History Family History  Problem Relation Age of Onset  . Breast cancer Mother        dx 64-44; BRCA2+; stage III w/ chemo and radiation  . Cancer Mother   . Asthma Daughter   . Lupus  Paternal Grandmother        d. 67  . Skin cancer Maternal Grandmother        "skin discoloration on arms" - not spotty  . Mental illness Maternal Aunt     Social History Social History   Tobacco Use  . Smoking status: Former Smoker    Packs/day: 0.50    Years: 8.00    Pack years: 4.00    Types: Cigarettes    Quit date: 12/26/2017    Years since quitting: 2.1  . Smokeless tobacco: Never Used  Vaping Use  . Vaping Use: Never used  Substance Use Topics  . Alcohol use: Never    Comment: maybe 1 drink per month  . Drug use: Never     Allergies   Patient has no known allergies.   Review of Systems Review of Systems   Physical Exam Triage Vital Signs ED Triage Vitals [02/14/20 1505]  Enc Vitals Group     BP      Pulse      Resp      Temp  Temp src      SpO2      Weight      Height      Head Circumference      Peak Flow      Pain Score 10     Pain Loc      Pain Edu?      Excl. in [place]?    No data found.  Updated Vital Signs BP 122/79 (BP Location: Left Arm)   Pulse 87   Temp 98 F (36.7 C) (Oral)   Resp 16   SpO2 99%   Visual Acuity Right Eye Distance:   Left Eye Distance:   Bilateral Distance:    Right Eye Near:   Left Eye Near:    Bilateral Near:     Physical Exam Vitals and nursing note reviewed.  Musculoskeletal:     Comments: Right ring finger with retained staple, macerated on the medial nail edge with nail involvement involvement.  Initially paper towel retained on staple as this was stapled to her skin.      UC Treatments / Results  Labs (all labs ordered are listed, but only abnormal results are displayed) Labs Reviewed  SARS CORONAVIRUS 2 BY RT PCR (HOSPITAL ORDER, [place] [facility])    EKG   Radiology DG Finger Ring Right  Result Date: 02/14/2020 CLINICAL DATA:  Status post trauma. EXAM: RIGHT RING FINGER 2+V COMPARISON:  None. FINDINGS: A 1.8 cm radiopaque staple is seen within the distal aspect of  the distal phalanx of the fourth right finger. An associated soft tissue defect and mildly displaced fracture of the tuft is noted. There is no evidence of fracture or dislocation. There is no evidence of arthropathy or other focal bone abnormality. Soft tissue swelling is seen involving the proximal aspect of the fourth right finger. A mild amount of proximal soft tissue air is suspected. IMPRESSION: Radiopaque staple within the distal phalanx of the fourth right finger with an associated fracture deformity. Electronically Signed   By: Virgina Norfolk M.D.   On: 02/14/2020 15:46    Procedures Procedures (including critical care time) Digital block performed using 0.5% Marcaine without epinephrine. Paper towel removed from stable.  Medications Ordered in UC Medications  HYDROcodone-acetaminophen (NORCO/VICODIN) 5-325 MG per tablet 1 tablet (1 tablet Oral Given 02/14/20 1521)    Initial Impression / Assessment and Plan / UC Course  I have reviewed the triage vital signs and the nursing notes.  Pertinent labs & imaging results that were available during my care of the patient were reviewed by me and considered in my medical decision making (see chart for details).     #Crush injury finger #Open fracture the distal fourth finger on right hand Patient is a 29 year old presenting with crush injury with open fracture of the distal fourth finger right hand. Staple retained. Discussed with on-call hand Dr. Fredna Dow, he will see her in the operating room today for washout and staple removal. [facility] tested and [facility] to [facility] care to be assumed by Dr. Maryan Rued. Final Clinical Impressions(s) / UC Diagnoses   Final diagnoses:  Crushing injury of finger, initial encounter  Open fracture of tuft of distal phalanx of finger     Discharge Instructions     Go to Admissions at [facility]     ED Prescriptions    None     PDMP not reviewed this encounter.   Purnell Shoemaker, PA-C 02/14/20 1711

## 2020-02-14 NOTE — ED Triage Notes (Signed)
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 Active controlled bleeding. Nail bed involved.

## 2020-02-14 NOTE — Discharge Instructions (Signed)

## 2020-02-14 NOTE — Anesthesia Preprocedure Evaluation (Signed)
Anesthesia Evaluation  Patient identified by MRN, date of birth, ID band Patient awake    Reviewed: Allergy & Precautions, H&P , NPO status , Patient's Chart, lab work & pertinent test results  Airway Mallampati: II   Neck ROM: full    Dental   Pulmonary former smoker,    breath sounds clear to auscultation       Cardiovascular negative cardio ROS   Rhythm:regular Rate:Normal     Neuro/Psych    GI/Hepatic   Endo/Other    Renal/GU      Musculoskeletal   Abdominal   Peds  Hematology   Anesthesia Other Findings   Reproductive/Obstetrics                             Anesthesia Physical Anesthesia Plan  ASA: II  Anesthesia Plan: General   Post-op Pain Management:    Induction: Intravenous  PONV Risk Score and Plan: 3 and Ondansetron, Dexamethasone, Midazolam and Treatment may vary due to age or medical condition  Airway Management Planned: LMA  Additional Equipment:   Intra-op Plan:   Post-operative Plan: Extubation in OR  Informed Consent: I have reviewed the patients History and Physical, chart, labs and discussed the procedure including the risks, benefits and alternatives for the proposed anesthesia with the patient or authorized representative who has indicated his/her understanding and acceptance.       Plan Discussed with: CRNA, Anesthesiologist and Surgeon  Anesthesia Plan Comments:         Anesthesia Quick Evaluation

## 2020-02-14 NOTE — Transfer of Care (Signed)
Immediate Anesthesia Transfer of Care Note  Patient: Gina Alvarez  Procedure(s) Performed: IRRIGATION AND DEBRIDEMENT RIGHT RING FINGER WITH NAILBED REPAIR (Right ) PERCUTANEOUS PINNING EXTREMITY (Right )  Patient Location: PACU  Anesthesia Type:General  Level of Consciousness: awake, alert  and oriented  Airway & Oxygen Therapy: Patient Spontanous Breathing  Post-op Assessment: Report given to RN and Post -op Vital signs reviewed and stable  Post vital signs: Reviewed and stable  Last Vitals:  Vitals Value Taken Time  BP 113/72 02/14/20 1918  Temp 36.6 C 02/14/20 1917  Pulse 111 02/14/20 1924  Resp 17 02/14/20 1924  SpO2 96 % 02/14/20 1924  Vitals shown include unvalidated device data.  Last Pain:  Vitals:   02/14/20 1917  TempSrc:   PainSc: (P) Asleep         Complications: No complications documented.

## 2020-02-14 NOTE — Op Note (Signed)
NAME: [place] RECORD NO: [id number] DATE OF BIRTH: 1991-04-18 FACILITY: [facility] LOCATION: [facility] OR PHYSICIAN: Tennis Must, MD   OPERATIVE REPORT   DATE OF PROCEDURE: 02/14/20    PREOPERATIVE DIAGNOSIS:   Right ring fingertip crush injury with open distal phalanx fracture and skin and nailbed lacerations   POSTOPERATIVE DIAGNOSIS:   Right ring fingertip crush injury with open distal phalanx fracture and skin and nailbed lacerations   PROCEDURE:   1.  Irrigation and debridement of right ring finger open distal phalanx fracture including removal of bone fragment 2.  Open reduction right ring finger open distal phalanx fracture 3.  Repair of right ring finger skin nailbed lacerations   SURGEON:  Leanora Cover, M.D.   ASSISTANT: none   ANESTHESIA:  General   INTRAVENOUS FLUIDS:  Per anesthesia flow sheet.   ESTIMATED BLOOD LOSS:  Minimal.   COMPLICATIONS:  None.   SPECIMENS:  none   TOURNIQUET TIME:    Total Tourniquet Time Documented: Upper Arm (Right) - 32 minutes Total: Upper Arm (Right) - 32 minutes    DISPOSITION:  Stable to PACU.   INDICATIONS: 29 year old female states she was at work when a Optician, dispensing came down on her right ring finger.  She was seen at urgent care where radiographs were taken revealing a distal phalanx tuft fracture with comminution.  She was noted to have retained staple and skin and nailbed lacerations.  Recommended irrigation debridement of the open fracture with reduction of the fracture and repair of skin and nailbed lacerations in the operating room. Risks, benefits and alternatives of surgery were discussed including the risks of blood loss, infection, damage to nerves, vessels, tendons, ligaments, bone for surgery, need for additional surgery, complications with wound healing, continued pain, nonunion, malunion,  stiffness.  She voiced understanding of these risks and elected to proceed.  OPERATIVE COURSE:  After being  identified preoperatively by myself,  the patient and I agreed on the procedure and site of the procedure.  The surgical site was marked.  Surgical consent had been signed. She was given IV antibiotics as preoperative antibiotic prophylaxis. She was transferred to the operating room and placed on the operating table in supine position with the Right Right upper extremity on an arm board.  General anesthesia was induced by the anesthesiologist.  Right upper extremity was prepped and draped in normal sterile orthopedic fashion.  A surgical pause was performed between the surgeons, anesthesia, and operating room staff and all were in agreement as to the patient, procedure, and site of procedure.  Tourniquet at the proximal aspect of the extremity was inflated to 250 mmHg after exsanguination of the arm with an Esmarch bandage.    The retained staple was removed with the pickups.  The wound was examined.  There is small amount of metallic foreign matter on the nailbed.  This was removed with the Ray-Tec sponge.  There was laceration at the ulnar side of the distal phalanx and into the nailbed from the ulnar side.  There is a flap of nailbed tissue with a distally ulnar based base.  There was comminution of the distal phalanx.  Contaminated hematoma was removed.  The knife was used to sharply debride the skin edges.  There were 2 small fragments of bone which were devitalized and were removed using a knife as well.  Once adequate debridement was obtained the wound was copiously irrigated with sterile saline by cystoscopy tubing.  1000 cc of irrigation was  used.  The distal phalanx fracture was then reduced under direct visualization.  5-0 Monocryl suture was used in an interrupted fashion to reapproximate the skin edges.  There were 2 lacerations which were able to be repaired at the ulnar side of the finger.  The nailbed was then reapproximated using 6-0 chromic suture in a interrupted fashion.  Good reapproximation  was obtained.  A piece of Xeroform was placed in nail fold the wound dressed with sterile Xeroform 4 x 4's and wrapped with a Coban dressing lightly.  AlumaFoam splints placed and wrapped lightly with Coban dressing.  Digital block was performed with quarter percent plain Marcaine to aid in postoperative analgesia.  The tourniquet was deflated at 32 minutes.  Fingertips were pink with brisk capillary refill after deflation of tourniquet.  The operative  drapes were broken down.  The patient was awoken from anesthesia safely.  She was transferred back to the stretcher and taken to PACU in stable condition.  I will see her back in the office in 1 week for postoperative followup.  I will give her a prescription for Norco 5/325 1-2 tabs PO q6 hours prn pain, dispense # 20 and Bactrim DS 1 p.o. twice daily x7 days.   Leanora Cover, MD Electronically signed, 02/14/20

## 2020-02-14 NOTE — H&P (Signed)
Gina Alvarez is an 29 y.o. female.   Chief Complaint: Right ring fingertip crush injury HPI: 29 year old right-hand-dominant female states at work today an Optician, dispensing crushed the end of her right ring finger.  She was seen at urgent care where radiographs were taken revealing a fracture of the distal phalanx.  Staple was unable to be removed by urgent care.  She has skin and nailbed laceration.  There is associated bleeding.  She noted a throbbing pain and decreased sensation in the fingertip.  She was given a digital block by urgent care which has helped with the pain.  Case discussed with Roland Rack, PA-C and his note from 02/14/2020 reviewed. Xrays viewed and interpreted by me: AP lateral oblique views of the right ring finger showed distal phalanx tuft fracture with comminution.  There is radiopaque foreign body. Labs reviewed: None  Allergies: No Known Allergies  Past Medical History:  Diagnosis Date  . Contraception management 08/19/2012   nexplanon inserted 08/19/12 in left arm  . Irregular menstrual bleeding 09/26/2013  . Medical history non-contributory   . Nexplanon insertion 06/25/2017   06/25/17   Removed 08/25/17 d/t possible infeciton    Past Surgical History:  Procedure Laterality Date  . NO PAST SURGERIES      Family History: Family History  Problem Relation Age of Onset  . Breast cancer Mother        dx 40-44; BRCA2+; stage III w/ chemo and radiation  . Cancer Mother   . Asthma Daughter   . Lupus Paternal Grandmother        d. 69  . Skin cancer Maternal Grandmother        "skin discoloration on arms" - not spotty  . Mental illness Maternal Aunt     Social History:   reports that she quit smoking about 2 years ago. Her smoking use included cigarettes. She has a 4.00 pack-year smoking history. She has never used smokeless tobacco. She reports that she does not drink alcohol and does not use drugs.  Medications: Facility-Administered Medications Prior  to Admission  Medication Dose Route Frequency Provider Last Rate Last Admin  . medroxyPROGESTERone (DEPO-PROVERA) injection 150 mg  150 mg Intramuscular Q90 days Ronnie Doss M, DO   150 mg at 05/31/18 1530  . medroxyPROGESTERone (DEPO-PROVERA) injection 150 mg  150 mg Intramuscular Q90 days Ronnie Doss M, DO   150 mg at 01/13/20 1528   Medications Prior to Admission  Medication Sig Dispense Refill  . medroxyPROGESTERone Acetate 150 MG/ML SUSY Inject 1 mL (150 mg total) into the muscle every 3 (three) months. (Patient not taking: Reported on 02/14/2020) 1 mL 3  . terbinafine (LAMISIL) 250 MG tablet Take 1 tablet (250 mg total) by mouth daily. Return in 6 weeks for labs. (Patient not taking: Reported on 02/14/2020) 30 tablet 1    Results for orders placed or performed during the hospital encounter of 02/14/20 (from the past 48 hour(s))  Pregnancy, urine POC     Status: None   Collection Time: 02/14/20  5:13 PM  Result Value Ref Range   Preg Test, Ur NEGATIVE NEGATIVE    Comment:        THE SENSITIVITY OF THIS METHODOLOGY IS >24 mIU/mL     DG Finger Ring Right  Result Date: 02/14/2020 CLINICAL DATA:  Status post trauma. EXAM: RIGHT RING FINGER 2+V COMPARISON:  None. FINDINGS: A 1.8 cm radiopaque staple is seen within the distal aspect of the distal phalanx of the fourth  right finger. An associated soft tissue defect and mildly displaced fracture of the tuft is noted. There is no evidence of fracture or dislocation. There is no evidence of arthropathy or other focal bone abnormality. Soft tissue swelling is seen involving the proximal aspect of the fourth right finger. A mild amount of proximal soft tissue air is suspected. IMPRESSION: Radiopaque staple within the distal phalanx of the fourth right finger with an associated fracture deformity. Electronically Signed   By: Virgina Norfolk M.D.   On: 02/14/2020 15:46     A comprehensive review of systems was negative. Review of  Systems: No fevers, chills, night sweats, chest pain, shortness of breath, nausea, vomiting, diarrhea, constipation, easy bleeding or bruising, headaches, dizziness, vision changes, fainting.   Blood pressure 119/66, pulse (!) 105, temperature 98.7 F (37.1 C), temperature source Oral, resp. rate 15, height 5' (1.524 m), weight 64.9 kg, SpO2 99 %.  General appearance: alert, cooperative and appears stated age Head: Normocephalic, without obvious abnormality, atraumatic Neck: supple, symmetrical, trachea midline Resp: clear to auscultation bilaterally Cardio: regular rate and rhythm Extremities: Intact sensation and capillary refill all digits with the exception of the right ring finger which has decreased sensation secondary to digital block.  Intact capillary refill in the fingertip..  +epl/fpl/io.  There is a staple in the ring finger.  There is laceration at the ulnar side of the digit and nail was avulsed.  She is able to flex and extend at the DIP joint. Pulses: 2+ and symmetric Skin: Skin color, texture, turgor normal. No rashes or lesions Neurologic: Grossly normal Incision/Wound: As above  Assessment/Plan Right ring fingertip crush injury with retained foreign body.  Recommend irrigation and debridement of open fracture removal foreign body repair of skin and nailbed lacerations with reduction of fracture in the operating room.  Risks, benefits and alternatives of surgery were discussed including risks of blood loss, infection, damage to nerves/vessels/tendons/ligament/bone, failure of surgery, need for additional surgery, complication with wound healing, stiffness, nonunion, malunion, amputation.  She voiced understanding of these risks and elected to proceed.    Leanora Cover 02/14/2020, 6:07 PM

## 2020-02-14 NOTE — Anesthesia Procedure Notes (Signed)
Procedure Name: Intubation Performed by: Milford Cage, CRNA Pre-anesthesia Checklist: Patient identified, Emergency Drugs available, Suction available and Patient being monitored Patient Re-evaluated:Patient Re-evaluated prior to induction Oxygen Delivery Method: Circle System Utilized Preoxygenation: Pre-oxygenation with 100% oxygen Induction Type: IV induction, Rapid sequence and Cricoid Pressure applied Laryngoscope Size: Miller and 2 Grade View: Grade I Tube type: Oral Tube size: 6.5 mm Number of attempts: 1 Airway Equipment and Method: Stylet and Oral airway Placement Confirmation: ETT inserted through vocal cords under direct vision,  positive ETCO2 and breath sounds checked- equal and bilateral Secured at: 21 cm Tube secured with: Tape Dental Injury: Teeth and Oropharynx as per pre-operative assessment

## 2020-02-15 ENCOUNTER — Encounter (HOSPITAL_COMMUNITY): Payer: Self-pay | Admitting: Orthopedic Surgery

## 2020-04-10 ENCOUNTER — Other Ambulatory Visit: Payer: Self-pay | Admitting: Family Medicine

## 2020-04-10 DIAGNOSIS — Z3042 Encounter for surveillance of injectable contraceptive: Secondary | ICD-10-CM

## 2020-04-23 ENCOUNTER — Other Ambulatory Visit: Payer: Self-pay | Admitting: Family Medicine

## 2020-04-23 DIAGNOSIS — Z3042 Encounter for surveillance of injectable contraceptive: Secondary | ICD-10-CM

## 2020-04-23 NOTE — Telephone Encounter (Signed)
Pt called to check status of refill on her depo shot. Explained to pt that we received a refill request from pharmacy on 04/10/20 but it looks like request was denied with a note stating "Refill not appropriate. Discontinued 02/14/20 at discharge."   Pt was not discharged from practice and pt says she needs her depo shot ASAP.  Please advise and call patient.

## 2020-04-23 NOTE — Telephone Encounter (Signed)
Pt aware refill sent to pharmacy 

## 2020-06-04 ENCOUNTER — Other Ambulatory Visit: Payer: Self-pay

## 2020-06-04 ENCOUNTER — Ambulatory Visit (HOSPITAL_COMMUNITY)
Admission: EM | Admit: 2020-06-04 | Discharge: 2020-06-04 | Disposition: A | Discharge status: Home / Self Care | Payer: 59

## 2020-06-05 ENCOUNTER — Other Ambulatory Visit: Payer: Self-pay

## 2020-06-05 ENCOUNTER — Ambulatory Visit (HOSPITAL_COMMUNITY)
Admission: EM | Admit: 2020-06-05 | Discharge: 2020-06-05 | Disposition: A | Discharge status: Home / Self Care | Payer: 59

## 2020-06-05 ENCOUNTER — Encounter (HOSPITAL_COMMUNITY): Payer: Self-pay

## 2020-06-05 ENCOUNTER — Emergency Department (HOSPITAL_COMMUNITY)
Admission: EM | Admit: 2020-06-05 | Discharge: 2020-06-05 | Disposition: A | Discharge status: Home / Self Care | Payer: 59 | Attending: Emergency Medicine | Admitting: Emergency Medicine

## 2020-06-05 ENCOUNTER — Emergency Department (HOSPITAL_COMMUNITY): Payer: 59

## 2020-06-05 DIAGNOSIS — R1031 Right lower quadrant pain: Secondary | ICD-10-CM

## 2020-06-05 DIAGNOSIS — R10A1 Flank pain, right side: Secondary | ICD-10-CM

## 2020-06-05 DIAGNOSIS — R109 Unspecified abdominal pain: Secondary | ICD-10-CM

## 2020-06-05 DIAGNOSIS — Z87891 Personal history of nicotine dependence: Secondary | ICD-10-CM | POA: Diagnosis not present

## 2020-06-05 DIAGNOSIS — R1011 Right upper quadrant pain: Secondary | ICD-10-CM | POA: Diagnosis not present

## 2020-06-05 DIAGNOSIS — N200 Calculus of kidney: Secondary | ICD-10-CM

## 2020-06-05 DIAGNOSIS — K8020 Calculus of gallbladder without cholecystitis without obstruction: Secondary | ICD-10-CM | POA: Insufficient documentation

## 2020-06-05 LAB — BASIC METABOLIC PANEL
Anion gap: 9 (ref 5–15)
BUN: 6 mg/dL (ref 6–20)
CO2: 17 mmol/L — ABNORMAL LOW (ref 22–32)
Calcium: 9 mg/dL (ref 8.9–10.3)
Chloride: 111 mmol/L (ref 98–111)
Creatinine, Ser: 0.58 mg/dL (ref 0.44–1.00)
GFR, Estimated: >60 mL/min (ref >60)
Glucose, Bld: 92 mg/dL (ref 70–99)
Potassium: 3.8 mmol/L (ref 3.5–5.1)
Sodium: 137 mmol/L (ref 135–145)

## 2020-06-05 LAB — CBC
HCT: 36.5 % (ref 36.0–46.0)
Hemoglobin: 13 g/dL (ref 12.0–15.0)
MCH: 32.5 pg (ref 26.0–34.0)
MCHC: 35.6 g/dL (ref 30.0–36.0)
MCV: 91.3 fL (ref 80.0–100.0)
Platelets: 282 10*3/uL (ref 150–400)
RBC: 4 MIL/uL (ref 3.87–5.11)
RDW: 11.5 % (ref 11.5–15.5)
WBC: 9.5 10*3/uL (ref 4.0–10.5)
nRBC: 0 % (ref 0.0–0.2)

## 2020-06-05 LAB — URINALYSIS, ROUTINE W REFLEX MICROSCOPIC
Bilirubin Urine: NEGATIVE
Glucose, UA: NEGATIVE mg/dL
Ketones, ur: NEGATIVE mg/dL
Nitrite: NEGATIVE
Protein, ur: NEGATIVE mg/dL
Specific Gravity, Urine: 1.017 (ref 1.005–1.030)
pH: 5 (ref 5.0–8.0)

## 2020-06-05 LAB — I-STAT BETA HCG BLOOD, ED (MC, WL, AP ONLY): I-stat hCG, quantitative: <5 m[IU]/mL (ref <5)

## 2020-06-05 MED ORDER — ONDANSETRON 4 MG PO TBDP: 4.0000 mg | ORAL_TABLET | Freq: Three times a day (TID) | ORAL | 0 refills | Status: DC | PRN

## 2020-06-05 MED ORDER — HYDROCODONE-ACETAMINOPHEN 5-325 MG PO TABS: 1.0000 | ORAL_TABLET | Freq: Four times a day (QID) | ORAL | 0 refills | Status: DC | PRN

## 2020-06-05 MED ORDER — KETOROLAC TROMETHAMINE 15 MG/ML IJ SOLN
15.0000 mg | Freq: Once | INTRAMUSCULAR | Status: AC
  Administered 2020-06-05: 15 mg via INTRAMUSCULAR
  Filled 2020-06-05: qty 1

## 2020-06-05 NOTE — ED Triage Notes (Signed)
Pt presents with left side pain x 4 days. Pt states when the pain starts it strong and is steady. Pt states it is a sharp pain that last for a while. She states a heating pad helped relieve the pain, until this morning when she noticed the heating pad no long er relives the pain. She states she tried taking advil but felt no relief.

## 2020-06-05 NOTE — Discharge Instructions (Signed)
Your CT scan today showed both a kidney stone on the right side as well as gallstones. He is able to treated symptomatically.  You Zofran as needed for nausea vomiting.  Use 3-4 over-the-counter ibuprofen as needed for mild to moderate pain.  You may also supplement with Tylenol. Use Norco as needed for severe breakthrough pain.  Have caution, this may make you tired or groggy.  Do not drive or operate heavy machinery while taking this medicine. Follow-up with the urologist listed below for further evaluation of your kidney stone. If you continue to have symptoms after this, follow-up with general surgery for further evaluation of your gallbladder. Eat a low-fat diet to decrease gallbladder symptoms. Exercise well-hydrated water. Return to the emergency room if you develop severe worsening pain, fevers, persistent vomiting, inability urinate, or any new or worsening, concerning symptoms

## 2020-06-05 NOTE — ED Triage Notes (Signed)
Pt sent from UC for eval of possible kidney stones. R side intermittent flank pain x 4 days.

## 2020-06-05 NOTE — ED Provider Notes (Signed)
[facility] EMERGENCY DEPARTMENT Provider Note   CSN: [id number] Arrival date & time: 06/05/20  6387     History No chief complaint on file.   Gina Alvarez is a 30 y.o. female presenting for evaluation of right-sided pain.  Patient says for the past 4 days, she has had intermittent right-sided pain.  Is mostly in her back.  Pain is intermittent, but occasionally very severe.  She has been taking Tylenol, using warm compresses and hot showers with minimal improvement of symptoms.  She occasionally has intermittent associated nausea, but no vomiting.  She denies fevers, chills, chest pain, shortness breath, cough, fall, trauma, or injury, urinary symptoms, abnormal bowel movements.  No history of similar.  No history of kidney stones.  No change in diet.  She has no other medical problems, takes no medications daily.  Additional history obtained from chart review.  Reviewed urgent care note prior to arrival  HPI     Past Medical History:  Diagnosis Date  . Contraception management 08/19/2012   nexplanon inserted 08/19/12 in left arm  . Irregular menstrual bleeding 09/26/2013  . Medical history non-contributory   . Nexplanon insertion 06/25/2017   06/25/17   Removed 08/25/17 d/t possible infeciton    Patient Active Problem List   Diagnosis Date Noted  . FHx: BRCA gene positive 10/21/2016  . Urinary retention 06/14/2012    Past Surgical History:  Procedure Laterality Date  . I & D EXTREMITY Right 02/14/2020   Procedure: IRRIGATION AND DEBRIDEMENT RIGHT RING FINGER WITH NAILBED REPAIR;  Surgeon: Leanora Cover, MD;  Location: [facility];  Service: Orthopedics;  Laterality: Right;  . NO PAST SURGERIES    . PERCUTANEOUS PINNING Right 02/14/2020   Procedure: PERCUTANEOUS PINNING EXTREMITY;  Surgeon: Leanora Cover, MD;  Location: [facility];  Service: Orthopedics;  Laterality: Right;     OB History    Gravida  3   Para  2   Term  2   Preterm  0   AB  1   Living  2      SAB  1   IAB  0   Ectopic  0   Multiple  0   Live Births  2           Family History  Problem Relation Age of Onset  . Breast cancer Mother        dx 54-44; BRCA2+; stage III w/ chemo and radiation  . Cancer Mother   . Asthma Daughter   . Lupus Paternal Grandmother        d. 91  . Skin cancer Maternal Grandmother        "skin discoloration on arms" - not spotty  . Mental illness Maternal Aunt     Social History   Tobacco Use  . Smoking status: Former Smoker    Packs/day: 0.50    Years: 8.00    Pack years: 4.00    Types: Cigarettes    Quit date: 12/26/2017    Years since quitting: 2.4  . Smokeless tobacco: Never Used  Vaping Use  . Vaping Use: Never used  Substance Use Topics  . Alcohol use: Never    Comment: maybe 1 drink per month  . Drug use: Never    Home Medications Prior to Admission medications   Medication Sig Start Date End Date Taking? Authorizing Provider  HYDROcodone-acetaminophen (NORCO/VICODIN) 5-325 MG tablet Take 1 tablet by mouth every 6 (six) hours as needed for severe pain. 06/05/20  Yes Oaklee Sunga, PA-C  ondansetron (ZOFRAN ODT) 4 MG disintegrating tablet Take 1 tablet (4 mg total) by mouth every 8 (eight) hours as needed for nausea or vomiting. 06/05/20  Yes Braeden Dolinski, PA-C  medroxyPROGESTERone Acetate 150 MG/ML SUSY INJECT 1 ML INTO THE MUSCLE EVERY 3 MONTHS 04/23/20   Ronnie Doss M, DO  sulfamethoxazole-trimethoprim (BACTRIM DS) 800-160 MG tablet Take 1 tablet by mouth 2 (two) times daily. 02/14/20   Leanora Cover, MD    Allergies    Patient has no known allergies.  Review of Systems   Review of Systems  Gastrointestinal: Positive for nausea.  Genitourinary: Positive for flank pain.  Musculoskeletal: Positive for back pain.  All other systems reviewed and are negative.   Physical Exam Updated Vital Signs BP 101/67 (BP Location: Left Arm)   Pulse 69   Temp 98.2 F (36.8 C) (Oral)   Resp 12   Ht 5'  (1.524 m)   Wt 65.3 kg   SpO2 100%   BMI 28.12 kg/m   Physical Exam Vitals and nursing note reviewed.  Constitutional:      General: She is not in acute distress.    Appearance: She is well-developed and well-nourished.     Comments: Resting comfortably in the bed in no acute distress  HENT:     Head: Normocephalic and atraumatic.  Eyes:     Extraocular Movements: EOM normal.     Conjunctiva/sclera: Conjunctivae normal.     Pupils: Pupils are equal, round, and reactive to light.  Cardiovascular:     Rate and Rhythm: Normal rate and regular rhythm.     Pulses: Normal pulses and intact distal pulses.  Pulmonary:     Effort: Pulmonary effort is normal. No respiratory distress.     Breath sounds: Normal breath sounds. No wheezing.  Abdominal:     General: There is no distension.     Palpations: Abdomen is soft. There is no mass.     Tenderness: There is abdominal tenderness. There is right CVA tenderness. There is no guarding or rebound.     Comments: Right CVA tenderness.  Right upper quadrant tenderness.  No TTP elsewhere in the abdomen.  No rigidity, guarding, distention.  Negative rebound.  No peritonitis.  Musculoskeletal:        General: Normal range of motion.     Cervical back: Normal range of motion and neck supple.  Skin:    General: Skin is warm and dry.     Capillary Refill: Capillary refill takes less than 2 seconds.  Neurological:     Mental Status: She is alert and oriented to person, place, and time.  Psychiatric:        Mood and Affect: Mood and affect normal.     ED Results / Procedures / Treatments   Labs (all labs ordered are listed, but only abnormal results are displayed) Labs Reviewed  URINALYSIS, ROUTINE W REFLEX MICROSCOPIC - Abnormal; Notable for the following components:      Result Value   APPearance HAZY (*)    Hgb urine dipstick MODERATE (*)    Leukocytes,Ua SMALL (*)    Bacteria, UA RARE (*)    All other components within normal limits   BASIC METABOLIC PANEL - Abnormal; Notable for the following components:   CO2 17 (*)    All other components within normal limits  CBC  I-STAT BETA HCG BLOOD, ED ([facility], [facility], [facility] ONLY)    EKG None  Radiology CT Renal Stone  Study  Result Date: 06/05/2020 CLINICAL DATA:  Right flank pain EXAM: CT ABDOMEN AND PELVIS WITHOUT CONTRAST TECHNIQUE: Multidetector CT imaging of the abdomen and pelvis was performed following the standard protocol without IV contrast. COMPARISON:  None. FINDINGS: Lower chest: No acute abnormality.  Trace pericardial effusion. Hepatobiliary: No focal liver abnormality. Small gallstones. No biliary dilatation. Pancreas: Unremarkable. Spleen: Unremarkable. Adrenals/Urinary Tract: Adrenals are unremarkable. Mild right hydronephrosis. There is a 9 mm (craniocaudal) obstructing calculus at the right ureteropelvic junction. Left kidney is unremarkable. Bladder is poorly evaluated due to under distension. Stomach/Bowel: Stomach is within normal limits. Bowel is normal in caliber. Normal appendix. Vascular/Lymphatic: No significant vascular findings on this noncontrast study. There are no enlarged lymph nodes identified. Reproductive: Uterus and bilateral adnexa are unremarkable. Other: No ascites.  Abdominal wall is unremarkable. Musculoskeletal: No significant or acute osseous abnormality. IMPRESSION: 9 mm obstructing calculus at the right ureteropelvic junction with mild hydronephrosis. Cholelithiasis. Electronically Signed   By: Macy Mis M.D.   On: 06/05/2020 11:44    Procedures Procedures (including critical care time)  Medications Ordered in ED Medications  ketorolac (TORADOL) 15 MG/ML injection 15 mg (15 mg Intramuscular Given 06/05/20 1327)    ED Course  I have reviewed the triage vital signs and the nursing notes.  Pertinent labs & imaging results that were available during my care of the patient were reviewed by me and considered in my medical decision making (see  chart for details).    MDM Rules/Calculators/A&P                          Patient presenting for evaluation of right-sided flank pain.  On exam, she appears nontoxic.  Pain is intermittent.  She does have CVA tenderness, but also right upper quadrant tenderness.  Labs obtained from triage interpreted by me, overall reassuring.  No leukocytosis.  LFTs were not obtained, however patient without fevers, chills, nausea, vomiting or pain after eating.  As such, doubt cholecystitis or biliary obstruction.  CT renal was ordered, does show a 9 mm stone in the right UPJ.  Patient also with cholelithiasis.  Discussed findings with patient.  Discussed both could potentially cause symptoms, however favor kidney stone at this time.  Discussed symptomatic treatment and close follow-up with urology.  Patient continues to have symptoms after kidney stone is managed, consider follow-up with general surgery as needed.  She does not have signs of acute cholecystitis, I do not believe she needs further GI work-up.  At this time, patient appears safe for discharge.  Return precautions given.  Patient states she understands and agrees to plan.  Final Clinical Impression(s) / ED Diagnoses Final diagnoses:  Kidney stone  Calculus of gallbladder without cholecystitis without obstruction    Rx / DC Orders ED Discharge Orders         Ordered    ondansetron (ZOFRAN ODT) 4 MG disintegrating tablet  Every 8 hours PRN        06/05/20 1319    HYDROcodone-acetaminophen (NORCO/VICODIN) 5-325 MG tablet  Every 6 hours PRN        06/05/20 1319           Zadkiel Dragan, PA-C 06/05/20 1332    Dorie Rank, MD 06/06/20 [phone]

## 2020-06-05 NOTE — ED Provider Notes (Signed)
[facility]    CSN: [id number] Arrival date & time: 06/05/20  0802      History   Chief Complaint Chief Complaint  Patient presents with  . Flank Pain    HPI Gina Alvarez is a 30 y.o. female.   Patient presents with pain in her right flank, right upper quadrant, and right lower quadrant x4 days.  The pain is constant, dull but with intermittent sharp acute pains.  No known aggravating factors.  She states the pain is getting worse; was previously improved with a heating pad but this has stopped working and the pain has increased since early this morning.  She denies fever, chills, dysuria, vomiting, diarrhea, pelvic pain, or other symptoms.    The history is provided by the patient and medical records.    Past Medical History:  Diagnosis Date  . Contraception management 08/19/2012   nexplanon inserted 08/19/12 in left arm  . Irregular menstrual bleeding 09/26/2013  . Medical history non-contributory   . Nexplanon insertion 06/25/2017   06/25/17   Removed 08/25/17 d/t possible infeciton    Patient Active Problem List   Diagnosis Date Noted  . FHx: BRCA gene positive 10/21/2016  . Urinary retention 06/14/2012    Past Surgical History:  Procedure Laterality Date  . I & D EXTREMITY Right 02/14/2020   Procedure: IRRIGATION AND DEBRIDEMENT RIGHT RING FINGER WITH NAILBED REPAIR;  Surgeon: Leanora Cover, MD;  Location: [facility];  Service: Orthopedics;  Laterality: Right;  . NO PAST SURGERIES    . PERCUTANEOUS PINNING Right 02/14/2020   Procedure: PERCUTANEOUS PINNING EXTREMITY;  Surgeon: Leanora Cover, MD;  Location: [facility];  Service: Orthopedics;  Laterality: Right;    OB History    Gravida  3   Para  2   Term  2   Preterm  0   AB  1   Living  2     SAB  1   IAB  0   Ectopic  0   Multiple  0   Live Births  2            Home Medications    Prior to Admission medications   Medication Sig Start Date End Date Taking? Authorizing Provider   HYDROcodone-acetaminophen (NORCO) 5-325 MG tablet 1-2 tabs po q6 hours prn pain 02/14/20   Leanora Cover, MD  medroxyPROGESTERone Acetate 150 MG/ML SUSY INJECT 1 ML INTO THE MUSCLE EVERY 3 MONTHS 04/23/20   Ronnie Doss M, DO  sulfamethoxazole-trimethoprim (BACTRIM DS) 800-160 MG tablet Take 1 tablet by mouth 2 (two) times daily. 02/14/20   Leanora Cover, MD    Family History Family History  Problem Relation Age of Onset  . Breast cancer Mother        dx 2-44; BRCA2+; stage III w/ chemo and radiation  . Cancer Mother   . Asthma Daughter   . Lupus Paternal Grandmother        d. 49  . Skin cancer Maternal Grandmother        "skin discoloration on arms" - not spotty  . Mental illness Maternal Aunt     Social History Social History   Tobacco Use  . Smoking status: Former Smoker    Packs/day: 0.50    Years: 8.00    Pack years: 4.00    Types: Cigarettes    Quit date: 12/26/2017    Years since quitting: 2.4  . Smokeless tobacco: Never Used  Vaping Use  . Vaping Use: Never used  Substance Use Topics  . Alcohol use: Never    Comment: maybe 1 drink per month  . Drug use: Never     Allergies   Patient has no known allergies.   Review of Systems Review of Systems  Constitutional: Negative for chills and fever.  HENT: Negative for ear pain and sore throat.   Eyes: Negative for pain and visual disturbance.  Respiratory: Negative for cough and shortness of breath.   Cardiovascular: Negative for chest pain and palpitations.  Gastrointestinal: Positive for abdominal pain. Negative for constipation, diarrhea and vomiting.  Genitourinary: Positive for flank pain. Negative for dysuria, hematuria and pelvic pain.  Musculoskeletal: Negative for arthralgias and back pain.  Skin: Negative for color change and rash.  Neurological: Negative for seizures and syncope.  All other systems reviewed and are negative.    Physical Exam Triage Vital Signs ED Triage Vitals  Enc Vitals  Group     BP      Pulse      Resp      Temp      Temp src      SpO2      Weight      Height      Head Circumference      Peak Flow      Pain Score      Pain Loc      Pain Edu?      Excl. in [place]?    No data found.  Updated Vital Signs BP 110/74 (BP Location: Left Arm)   Pulse 99   Temp 98.3 F (36.8 C) (Oral)   Resp 18   SpO2 98%   Visual Acuity Right Eye Distance:   Left Eye Distance:   Bilateral Distance:    Right Eye Near:   Left Eye Near:    Bilateral Near:     Physical Exam Vitals and nursing note reviewed.  Constitutional:      General: She is not in acute distress.    Appearance: She is well-developed and well-nourished. She is ill-appearing.  HENT:     Head: Normocephalic and atraumatic.     Mouth/Throat:     Mouth: Mucous membranes are moist.  Eyes:     Conjunctiva/sclera: Conjunctivae normal.  Cardiovascular:     Rate and Rhythm: Normal rate and regular rhythm.     Heart sounds: No murmur heard.   Pulmonary:     Effort: Pulmonary effort is normal. No respiratory distress.     Breath sounds: Normal breath sounds.  Abdominal:     General: Bowel sounds are normal. There is no distension.     Palpations: Abdomen is soft.     Tenderness: There is abdominal tenderness in the right upper quadrant and right lower quadrant. There is right CVA tenderness. There is no left CVA tenderness, guarding or rebound.  Musculoskeletal:        General: No edema.     Cervical back: Neck supple.  Skin:    General: Skin is warm and dry.  Neurological:     General: No focal deficit present.     Mental Status: She is alert and oriented to person, place, and time.  Psychiatric:        Mood and Affect: Mood and affect and mood normal.        Behavior: Behavior normal.      UC Treatments / Results  Labs (all labs ordered are listed, but only abnormal results are displayed) Labs Reviewed  POCT  URINALYSIS DIPSTICK, ED / UC  POC URINE PREG, ED     EKG   Radiology No results found.  Procedures Procedures (including critical care time)  Medications Ordered in UC Medications - No data to display  Initial Impression / Assessment and Plan / UC Course  I have reviewed the triage vital signs and the nursing notes.  Pertinent labs & imaging results that were available during my care of the patient were reviewed by me and considered in my medical decision making (see chart for details).   Right flank pain, RUQ abdominal pain, RLQ abdominal pain.  Sending patient to the ED for further evaluation.  She agrees to plan of care and feels stable to transport herself.      Final Clinical Impressions(s) / UC Diagnoses   Final diagnoses:  Right flank pain  RUQ abdominal pain  RLQ abdominal pain   Discharge Instructions   None    ED Prescriptions    None     PDMP not reviewed this encounter.   Sharion Balloon, NP 06/05/20 [phone]

## 2020-06-05 NOTE — ED Notes (Signed)
Patient is being discharged from the Urgent Care and sent to the Emergency Department via *POV . Per K. Hall Busing, , patient is in need of higher level of care due to flank pain. Patient is aware and verbalizes understanding of plan of care.  Vitals:   06/05/20 0842  BP: 110/74  Pulse: 99  Resp: 18  Temp: 98.3 F (36.8 C)  SpO2: 98%

## 2020-06-06 ENCOUNTER — Ambulatory Visit: Payer: Managed Care, Other (non HMO) | Admitting: Family Medicine

## 2020-06-07 ENCOUNTER — Other Ambulatory Visit: Payer: Self-pay | Admitting: Urology

## 2020-06-07 ENCOUNTER — Ambulatory Visit (HOSPITAL_COMMUNITY): Payer: 59

## 2020-06-07 ENCOUNTER — Encounter (HOSPITAL_BASED_OUTPATIENT_CLINIC_OR_DEPARTMENT_OTHER): Payer: Self-pay | Admitting: Urology

## 2020-06-07 ENCOUNTER — Ambulatory Visit (HOSPITAL_BASED_OUTPATIENT_CLINIC_OR_DEPARTMENT_OTHER)
Admission: RE | Admit: 2020-06-07 | Discharge: 2020-06-07 | Disposition: A | Discharge status: Home / Self Care | Payer: 59 | Source: Ambulatory Visit | Attending: Urology | Admitting: Urology

## 2020-06-07 ENCOUNTER — Encounter (HOSPITAL_BASED_OUTPATIENT_CLINIC_OR_DEPARTMENT_OTHER)
Admission: RE | Disposition: A | Discharge status: Home / Self Care | Payer: Self-pay | Source: Ambulatory Visit | Attending: Urology

## 2020-06-07 DIAGNOSIS — Z20822 Contact with and (suspected) exposure to covid-19: Secondary | ICD-10-CM | POA: Insufficient documentation

## 2020-06-07 DIAGNOSIS — N200 Calculus of kidney: Secondary | ICD-10-CM | POA: Diagnosis not present

## 2020-06-07 DIAGNOSIS — Z803 Family history of malignant neoplasm of breast: Secondary | ICD-10-CM | POA: Insufficient documentation

## 2020-06-07 DIAGNOSIS — N201 Calculus of ureter: Secondary | ICD-10-CM

## 2020-06-07 HISTORY — PX: EXTRACORPOREAL SHOCK WAVE LITHOTRIPSY: SHX1557

## 2020-06-07 LAB — RESP PANEL BY RT-PCR (FLU A&B, COVID) ARPGX2
Influenza A by PCR: NEGATIVE
Influenza B by PCR: NEGATIVE
SARS Coronavirus 2 by RT PCR: NEGATIVE

## 2020-06-07 LAB — POCT PREGNANCY, URINE: Preg Test, Ur: NEGATIVE

## 2020-06-07 SURGERY — LITHOTRIPSY, ESWL
Anesthesia: LOCAL | Laterality: Right

## 2020-06-07 MED ORDER — CIPROFLOXACIN HCL 500 MG PO TABS
500.0000 mg | ORAL_TABLET | ORAL | Status: AC
  Administered 2020-06-07: 500 mg via ORAL
  Filled 2020-06-07: qty 1

## 2020-06-07 MED ORDER — DIPHENHYDRAMINE HCL 25 MG PO CAPS
ORAL_CAPSULE | ORAL | Status: AC
  Filled 2020-06-07: qty 1

## 2020-06-07 MED ORDER — OXYCODONE HCL 5 MG PO TABS
ORAL_TABLET | ORAL | Status: AC
  Filled 2020-06-07: qty 1

## 2020-06-07 MED ORDER — SODIUM CHLORIDE 0.9 % IV SOLN: INTRAVENOUS | Status: DC

## 2020-06-07 MED ORDER — DIPHENHYDRAMINE HCL 25 MG PO CAPS
25.0000 mg | ORAL_CAPSULE | ORAL | Status: AC
  Administered 2020-06-07: 25 mg via ORAL

## 2020-06-07 MED ORDER — DIAZEPAM 5 MG PO TABS
10.0000 mg | ORAL_TABLET | ORAL | Status: AC
  Administered 2020-06-07: 10 mg via ORAL

## 2020-06-07 MED ORDER — HYDROCODONE-ACETAMINOPHEN 5-325 MG PO TABS: 1.0000 | ORAL_TABLET | ORAL | 0 refills | Status: DC | PRN

## 2020-06-07 MED ORDER — TAMSULOSIN HCL 0.4 MG PO CAPS: 0.4000 mg | ORAL_CAPSULE | Freq: Every day | ORAL | 0 refills | Status: DC

## 2020-06-07 MED ORDER — DIAZEPAM 5 MG PO TABS
ORAL_TABLET | ORAL | Status: AC
  Filled 2020-06-07: qty 2

## 2020-06-07 MED ORDER — OXYCODONE HCL 5 MG PO TABS
5.0000 mg | ORAL_TABLET | Freq: Once | ORAL | Status: AC
  Administered 2020-06-07: 5 mg via ORAL

## 2020-06-07 NOTE — Op Note (Signed)
ES[facility] Operative Note  Treating Physician: Ellison Hughs, MD  Pre-op diagnosis: 9 mm right UPJ stone  Post-op diagnosis: Same   Procedure: RIGHT ESWL  See Aris Everts OP note scanned into chart. Also because of the size, density, location and other factors that cannot be anticipated I feel this will likely be a staged procedure. This fact supersedes any indication in the scanned [place] stone operative note to the contrary

## 2020-06-07 NOTE — Progress Notes (Signed)
Add on ESWL. Patient swabbed for rapid covid.test. History and medications reviewed. Pre-procedure instructions given. To remain NPO, except for clear liquids until 1115. Patient states she has not had solids since MN and has only had water this morning. Driver secured.

## 2020-06-07 NOTE — Discharge Instructions (Signed)

## 2020-06-07 NOTE — H&P (Signed)
See scanned H&P from [facility] 

## 2020-06-08 ENCOUNTER — Encounter (HOSPITAL_BASED_OUTPATIENT_CLINIC_OR_DEPARTMENT_OTHER): Payer: Self-pay | Admitting: Urology

## 2020-08-10 ENCOUNTER — Other Ambulatory Visit (HOSPITAL_COMMUNITY)
Admission: RE | Admit: 2020-08-10 | Discharge: 2020-08-10 | Disposition: A | Discharge status: Home / Self Care | Payer: 59 | Source: Ambulatory Visit | Attending: Family Medicine | Admitting: Family Medicine

## 2020-08-10 ENCOUNTER — Ambulatory Visit (INDEPENDENT_AMBULATORY_CARE_PROVIDER_SITE_OTHER): Payer: Medicaid Other | Admitting: Family Medicine

## 2020-08-10 ENCOUNTER — Other Ambulatory Visit: Payer: Self-pay

## 2020-08-10 VITALS — BP 107/66 | HR 97 | Temp 97.4°F | Ht 60.0 in | Wt 144.0 lb

## 2020-08-10 DIAGNOSIS — Z124 Encounter for screening for malignant neoplasm of cervix: Secondary | ICD-10-CM | POA: Diagnosis present

## 2020-08-10 DIAGNOSIS — Z87442 Personal history of urinary calculi: Secondary | ICD-10-CM

## 2020-08-10 DIAGNOSIS — F320 Major depressive disorder, single episode, mild: Secondary | ICD-10-CM

## 2020-08-10 DIAGNOSIS — Z01419 Encounter for gynecological examination (general) (routine) without abnormal findings: Secondary | ICD-10-CM

## 2020-08-10 DIAGNOSIS — Z8481 Family history of carrier of genetic disease: Secondary | ICD-10-CM

## 2020-08-10 MED ORDER — SERTRALINE HCL 50 MG PO TABS: 50.0000 mg | ORAL_TABLET | Freq: Every day | ORAL | 1 refills | Status: DC

## 2020-08-10 NOTE — Patient Instructions (Addendum)
I've put a new order for BRCA gene testing.  You had labs performed today.  You will be contacted with the results of the labs once they are available, usually in the next 3 business days for routine lab work.  If you have an active my chart account, they will be released to your MyChart.  If you prefer to have these labs released to you via telephone, please let us know.  If you had a pap smear or biopsy performed, expect to be contacted in about 7-10 days.  Preventive Care 72-30 Years Old, Female Preventive care refers to lifestyle choices and visits with your health care provider that can promote health and wellness. This includes:  A yearly physical exam. This is also called an annual wellness visit.  Regular dental and eye exams.  Immunizations.  Screening for certain conditions.  Healthy lifestyle choices, such as: ? Eating a healthy diet. ? Getting regular exercise. ? Not using drugs or products that contain nicotine and tobacco. ? Limiting alcohol use. What can I expect for my preventive care visit? Physical exam Your health care provider may check your:  Height and weight. These may be used to calculate your BMI (body mass index). BMI is a measurement that tells if you are at a healthy weight.  Heart rate and blood pressure.  Body temperature.  Skin for abnormal spots. Counseling Your health care provider may ask you questions about your:  Past medical problems.  Family's medical history.  Alcohol, tobacco, and drug use.  Emotional well-being.  Home life and relationship well-being.  Sexual activity.  Diet, exercise, and sleep habits.  Work and work Statistician.  Access to firearms.  Method of birth control.  Menstrual cycle.  Pregnancy history. What immunizations do I need? Vaccines are usually given at various ages, according to a schedule. Your health care provider will recommend vaccines for you based on your age, medical history, and lifestyle  or other factors, such as travel or where you work.   What tests do I need? Blood tests  Lipid and cholesterol levels. These may be checked every 5 years starting at age 28.  Hepatitis C test.  Hepatitis B test. Screening  Diabetes screening. This is done by checking your blood sugar (glucose) after you have not eaten for a while (fasting).  STD (sexually transmitted disease) testing, if you are at risk.  BRCA-related cancer screening. This may be done if you have a family history of breast, ovarian, tubal, or peritoneal cancers.  Pelvic exam and Pap test. This may be done every 3 years starting at age 63. Starting at age 58, this may be done every 5 years if you have a Pap test in combination with an HPV test. Talk with your health care provider about your test results, treatment options, and if necessary, the need for more tests.   Follow these instructions at home: Eating and drinking  Eat a healthy diet that includes fresh fruits and vegetables, whole grains, lean protein, and low-fat dairy products.  Take vitamin and mineral supplements as recommended by your health care provider.  Do not drink alcohol if: ? Your health care provider tells you not to drink. ? You are pregnant, may be pregnant, or are planning to become pregnant.  If you drink alcohol: ? Limit how much you have to 0-1 drink a day. ? Be aware of how much alcohol is in your drink. In the [place] one drink equals one 12 oz bottle of beer (  355 mL), one 5 oz glass of wine (148 mL), or one 1 oz glass of hard liquor (44 mL).   Lifestyle  Take daily care of your teeth and gums. Brush your teeth every morning and night with fluoride toothpaste. Floss one time each day.  Stay active. Exercise for at least 30 minutes 5 or more days each week.  Do not use any products that contain nicotine or tobacco, such as cigarettes, e-cigarettes, and chewing tobacco. If you need help quitting, ask your health care provider.  Do  not use drugs.  If you are sexually active, practice safe sex. Use a condom or other form of protection to prevent STIs (sexually transmitted infections).  If you do not wish to become pregnant, use a form of birth control. If you plan to become pregnant, see your health care provider for a prepregnancy visit.  Find healthy ways to cope with stress, such as: ? Meditation, yoga, or listening to music. ? Journaling. ? Talking to a trusted person. ? Spending time with friends and family. Safety  Always wear your seat belt while driving or riding in a vehicle.  Do not drive: ? If you have been drinking alcohol. Do not ride with someone who has been drinking. ? When you are tired or distracted. ? While texting.  Wear a helmet and other protective equipment during sports activities.  If you have firearms in your house, make sure you follow all gun safety procedures.  Seek help if you have been physically or sexually abused. What's next?  Go to your health care provider once a year for an annual wellness visit.  Ask your health care provider how often you should have your eyes and teeth checked.  Stay up to date on all vaccines. This information is not intended to replace advice given to you by your health care provider. Make sure you discuss any questions you have with your health care provider. Document Revised: 01/08/2020 Document Reviewed: 01/21/2018 Elsevier Patient Education  2021 [employer].

## 2020-08-10 NOTE — Progress Notes (Signed)
Gina Alvarez is a 30 y.o. female presents to office today for annual physical exam examination.    Concerns today include: 1.  Breast cancer Mother with known BRCA gene positive breast cancer.  Mother underwent double mastectomy at age 32.  No known family history of ovarian cancer, breast cancers or prostate cancer or no known colon cancers.  None of her aunts, sister have been tested for BRCA.  Patient would like to be tested for the BRCA gene.  No reports of breast lumps that she is about.  2.  Depression Patient reports low motivation.  She is easily moody.  She reports essentially feel like she has no get up and go.  A lot of the symptoms seem to surround her spitting up with her most recent spouse.  She feels that she "has another failed family".  She has not seen a counselor for this nor ever been treated for depression.  No SI, HI.  No visual auditory hallucinations.  No known family history of schizophrenia.  Possible aunt with bipolar disorder.  Last pap smear: needs Immunizations needed:  Immunization History  Administered Date(s) Administered  . DTaP 01/17/1991  . Hepatitis B 03/21/1991, 06/06/1991  . HiB (PRP-OMP) 01/17/1991  . IPV 01/17/1991  . Influenza,inj,Quad PF,6+ Mos 03/11/2017, 03/15/2019  . Influenza-Unspecified 03/13/2018  . Td 01/23/2011  . Tdap 08/25/2015, 05/17/2017     Past Medical History:  Diagnosis Date  . Contraception management 08/19/2012   nexplanon inserted 08/19/12 in left arm  . Irregular menstrual bleeding 09/26/2013  . Medical history non-contributory   . Nexplanon insertion 06/25/2017   06/25/17   Removed 08/25/17 d/t possible infeciton   Social History   Socioeconomic History  . Marital status: Single    Spouse name: Not on file  . Number of children: Not on file  . Years of education: Not on file  . Highest education level: Not on file  Occupational History  . Not on file  Tobacco Use  . Smoking status: Former Smoker     Packs/day: 0.50    Years: 8.00    Pack years: 4.00    Types: Cigarettes    Quit date: 12/26/2017    Years since quitting: 2.6  . Smokeless tobacco: Never Used  Vaping Use  . Vaping Use: Never used  Substance and Sexual Activity  . Alcohol use: Never    Comment: maybe 1 drink per month  . Drug use: Never  . Sexual activity: Not Currently    Partners: Male    Birth control/protection: None  Other Topics Concern  . Not on file  Social History Narrative  . Not on file   Social Determinants of Health   Financial Resource Strain: Not on file  Food Insecurity: Not on file  Transportation Needs: Not on file  Physical Activity: Not on file  Stress: Not on file  Social Connections: Not on file  Intimate Partner Violence: Not on file   Past Surgical History:  Procedure Laterality Date  . EXTRACORPOREAL SHOCK WAVE LITHOTRIPSY Right 06/07/2020   Procedure: EXTRACORPOREAL SHOCK WAVE LITHOTRIPSY (ESWL);  Surgeon: Ceasar Mons, MD;  Location: [facility];  Service: Urology;  Laterality: Right;  . I & D EXTREMITY Right 02/14/2020   Procedure: IRRIGATION AND DEBRIDEMENT RIGHT RING FINGER WITH NAILBED REPAIR;  Surgeon: Leanora Cover, MD;  Location: [place];  Service: Orthopedics;  Laterality: Right;  . NO PAST SURGERIES    . PERCUTANEOUS PINNING Right 02/14/2020  Procedure: PERCUTANEOUS PINNING EXTREMITY;  Surgeon: Leanora Cover, MD;  Location: [place];  Service: Orthopedics;  Laterality: Right;   Family History  Problem Relation Age of Onset  . Breast cancer Mother        dx 92-44; BRCA2+; stage III w/ chemo and radiation  . Cancer Mother   . Asthma Daughter   . Lupus Paternal Grandmother        d. 26  . Skin cancer Maternal Grandmother        "skin discoloration on arms" - not spotty  . Mental illness Maternal Aunt     Current Outpatient Medications:  .  medroxyPROGESTERone Acetate 150 MG/ML SUSY, INJECT 1 ML INTO THE MUSCLE EVERY 3 MONTHS, Disp: 1 mL, Rfl:  3 .  ondansetron (ZOFRAN ODT) 4 MG disintegrating tablet, Take 1 tablet (4 mg total) by mouth every 8 (eight) hours as needed for nausea or vomiting., Disp: 20 tablet, Rfl: 0 .  sulfamethoxazole-trimethoprim (BACTRIM DS) 800-160 MG tablet, Take 1 tablet by mouth 2 (two) times daily., Disp: 14 tablet, Rfl: 0 .  tamsulosin (FLOMAX) 0.4 MG CAPS capsule, Take 1 capsule (0.4 mg total) by mouth daily., Disp: 30 capsule, Rfl: 0  Current Facility-Administered Medications:  .  medroxyPROGESTERone (DEPO-PROVERA) injection 150 mg, 150 mg, Intramuscular, Q90 days, Carmen Vallecillo M, DO, 150 mg at 01/13/20 1528  No Known Allergies   ROS: Review of Systems Pertinent items noted in HPI and remainder of comprehensive ROS otherwise negative.    Physical exam BP 107/66   Pulse 97   Temp (!) 97.4 F (36.3 C) (Temporal)   Ht 5' (1.524 m)   Wt 144 lb (65.3 kg)   SpO2 98%   BMI 28.12 kg/m  General appearance: alert, cooperative, appears stated age and no distress Head: Normocephalic, without obvious abnormality, atraumatic Eyes: negative findings: lids and lashes normal, conjunctivae and sclerae normal, corneas clear and pupils equal, round, reactive to light and accomodation Ears: normal TM's and external ear canals both ears Nose: Nares normal. Septum midline. Mucosa normal. No drainage or sinus tenderness. Throat: lips, mucosa, and tongue normal; teeth and gums normal Neck: no adenopathy, supple, symmetrical, trachea midline and thyroid not enlarged, symmetric, no tenderness/mass/nodules Back: symmetric, no curvature. ROM normal. No CVA tenderness. Lungs: clear to auscultation bilaterally Heart: regular rate and rhythm, S1, S2 normal, no murmur, click, rub or gallop Abdomen: soft, non-tender; bowel sounds normal; no masses,  no organomegaly Pelvic: external genitalia normal, no adnexal masses or tenderness, no cervical motion tenderness, rectovaginal septum normal, uterus normal size, shape, and  consistency and Mild white discharge noted from the office.  She has slight hyperemia and easy bleeding with cervical swab. Extremities: extremities normal, atraumatic, no cyanosis or edema Pulses: 2+ and symmetric Skin: Skin color, texture, turgor normal. No rashes or lesions Lymph nodes: Cervical, supraclavicular, and axillary nodes normal. Neurologic: Alert and oriented X 3, normal strength and tone. Normal symmetric reflexes. Normal coordination and gait Psych: Mood somewhat depressed.  Good eye contact.  Does not appear to be responding to internal stimuli  Depression screen [facility] 2/9 08/10/2020 04/06/2019 02/10/2018  Decreased Interest 1 0 0  Down, Depressed, Hopeless 1 0 0  PHQ - 2 Score 2 0 0  Altered sleeping 1 0 -  Tired, decreased energy 2 0 -  Change in appetite 0 0 -  Feeling bad or failure about yourself  1 0 -  Trouble concentrating 1 0 -  Moving slowly or fidgety/restless 0 0 -  Suicidal thoughts 0 0 -  PHQ-9 Score 7 0 -  Difficult doing work/chores Somewhat difficult - -   No flowsheet data found.  Assessment/ Plan: [name] here for annual physical exam.   Well woman exam with routine gynecological exam - Plan: Cytology - PAP  Screening for malignant neoplasm of cervix - Plan: Cytology - PAP  History of renal stone  FHx: BRCA gene positive - Plan: BRCAssure Comprehensive Panel, CANCELED: Ambulatory referral to Genetics, CANCELED: BRCAssure Comprehensive Panel  Depression, major, single episode, mild ([place]) - Plan: sertraline (ZOLOFT) 50 MG tablet  She wanted to try and get the BRCA screening here in office.  She certainly has a first-degree relative (mother) that is known BRCA gene positive with bilateral mastectomy and her mid 81s.  I am glad to order BRCA panel but am not sure that this is all that we would need to order given family history.  I explained that to her but she is willing to simply get the BRCA gene check.  She will reach out to her insurance  to make sure that this is covered from a nongeneticist.  She does have mild depression and is likely reactive.  Zoloft prescribed.  Handout with [facility] active therapy information provided.  She will follow-up in 4 to 6 weeks via MyChart to let me know how medication is going.  We discussed potential side effects.  Counseled on healthy lifestyle choices, including diet (rich in fruits, vegetables and lean meats and low in salt and simple carbohydrates) and exercise (at least 30 minutes of moderate physical activity daily).  Patient to follow up in 1 year for annual exam or sooner if needed.  Rafia Shedden M. Lajuana Ripple, DO

## 2020-08-14 LAB — CYTOLOGY - PAP: Diagnosis: NEGATIVE

## 2020-08-30 ENCOUNTER — Other Ambulatory Visit: Payer: Self-pay

## 2020-08-30 ENCOUNTER — Ambulatory Visit: Payer: 59 | Admitting: General Surgery

## 2020-08-30 ENCOUNTER — Other Ambulatory Visit: Payer: Self-pay | Admitting: Family Medicine

## 2020-08-30 ENCOUNTER — Encounter: Payer: Self-pay | Admitting: General Surgery

## 2020-08-30 ENCOUNTER — Ambulatory Visit (INDEPENDENT_AMBULATORY_CARE_PROVIDER_SITE_OTHER): Payer: 59 | Admitting: General Surgery

## 2020-08-30 VITALS — BP 105/69 | HR 83 | Temp 97.1°F | Resp 16 | Ht 59.0 in | Wt 141.0 lb

## 2020-08-30 DIAGNOSIS — K8020 Calculus of gallbladder without cholecystitis without obstruction: Secondary | ICD-10-CM | POA: Diagnosis not present

## 2020-08-30 HISTORY — DX: Calculus of gallbladder without cholecystitis without obstruction: K80.20

## 2020-08-30 NOTE — Patient Instructions (Signed)
Cholelithiasis  Cholelithiasis is a disease in which gallstones form in the gallbladder. The gallbladder is an organ that stores bile. Bile is a fluid that helps to digest fats. Gallstones begin as small crystals and can slowly grow into stones. They may cause no symptoms until they block the gallbladder duct, or cystic duct, when the gallbladder tightens (contracts) after food is eaten. This can cause pain and is known as a gallbladder attack, or biliary colic. There are two main types of gallstones:  Cholesterol stones. These are the most common type of gallstone. These stones are made of hardened cholesterol and are usually yellow-green in color. Cholesterol is a fat-like substance that is made in the liver.  Pigment stones. These are dark in color and are made of a red-yellow substance, called bilirubin,that forms when hemoglobin from red blood cells breaks down. What are the causes? This condition may be caused by an imbalance in the different parts that make bile. This can happen if the bile:  Has too much bilirubin. This can happen in certain blood diseases, such as sickle cell anemia.  Has too much cholesterol.  Does not have enough bile salts. These salts help the body absorb and digest fats. In some cases, this condition can also be caused by the gallbladder not emptying completely or often enough. This is common during pregnancy. What increases the risk? The following factors may make you more likely to develop this condition:  Being female.  Having multiple pregnancies. Health care providers sometimes advise removing diseased gallbladders before future pregnancies.  Eating a diet that is heavy in fried foods, fat, and refined carbohydrates, such as white bread and white rice.  Being obese.  Being older than age 40.  Using medicines that contain female hormones (estrogen) for a long time.  Losing weight quickly.  Having a family history of gallstones.  Having certain  medical problems, such as: ? Diabetes mellitus. ? Cystic fibrosis. ? Crohn's disease. ? Cirrhosis or other long-term (chronic) liver disease. ? Certain blood diseases, such as sickle cell anemia or leukemia. What are the signs or symptoms? In many cases, having gallstones causes no symptoms. When you have gallstones but do not have symptoms, you have silent gallstones. If a gallstone blocks your bile duct, it can cause a gallbladder attack. The main symptom of a gallbladder attack is sudden pain in the upper right part of the abdomen. The pain:  Usually comes at night or after eating.  Can last for one hour or more.  Can spread to your right shoulder, back, or chest.  Can feel like indigestion. This is discomfort, burning, or fullness in your upper abdomen. If the bile duct is blocked for more than a few hours, it can cause an infection or inflammation of your gallbladder (cholecystitis), liver, or pancreas. This can cause:  Nausea or vomiting.  Bloating.  Pain in your abdomen that lasts for 5 hours or longer.  Tenderness in your upper abdomen, often in the upper right section and under your rib cage.  Fever or chills.  Skin or the white parts of your eyes turning yellow (jaundice). This usually happens when a stone has blocked bile from passing through the common bile duct.  Dark urine or light-colored stools. How is this diagnosed? This condition may be diagnosed based on:  A physical exam.  Your medical history.  Ultrasound.  CT scan.  MRI. You may also have other tests, including:  Blood tests to check for signs of an   infection or inflammation.  Cholescintigraphy, or HIDA scan. This is a scan of your gallbladder and bile ducts (biliary system) using non-harmful radioactive material and special cameras that can see the radioactive material.  Endoscopic retrograde cholangiopancreatogram. This involves inserting a small tube with a camera on the end (endoscope)  through your mouth to look at bile ducts and check for blockages. How is this treated? Treatment for this condition depends on the severity of the condition. Silent gallstones do not need treatment. Treatment may be needed if a blockage causes a gallbladder attack or other symptoms. Treatment may include:  Home care, if symptoms are not severe. ? During a simple gallbladder attack, stop eating and drinking for 12-24 hours (except for water and clear liquids). This helps to "cool down" your gallbladder. After 1 or 2 days, you can start to eat a diet of simple or clear foods, such as broths and crackers. ? You may also need medicines for pain or nausea or both. ? If you have cholecystitis and an infection, you will need antibiotics.  A hospital stay, if needed for pain control or for cholecystitis with severe infection.  Cholecystectomy, or surgery to remove your gallbladder. This is the most common treatment if all other treatments have not worked.  Medicines to break up gallstones. These are most effective at treating small gallstones. Medicines may be used for up to 6-12 months.  Endoscopic retrograde cholangiopancreatogram. A small basket can be attached to the endoscope and used to capture and remove gallstones, mainly those that are in the common bile duct. Follow these instructions at home: Medicines  Take over-the-counter and prescription medicines only as told by your health care provider.  If you were prescribed an antibiotic medicine, take it as told by your health care provider. Do not stop taking the antibiotic even if you start to feel better.  Ask your health care provider if the medicine prescribed to you requires you to avoid driving or using machinery. Eating and drinking  Drink enough fluid to keep your urine pale yellow. This is important during a gallbladder attack. Water and clear liquids are preferred.  Follow a healthy diet. This includes: ? Reducing fatty foods,  such as fried food and foods high in cholesterol. ? Reducing refined carbohydrates, such as white bread and white rice. ? Eating more fiber. Aim for foods such as almonds, fruit, and beans. Alcohol use  If you drink alcohol: ? Limit how much you use to:  0-1 drink a day for nonpregnant women.  0-2 drinks a day for men. ? Be aware of how much alcohol is in your drink. In the [place] one drink equals one 12 oz bottle of beer (355 mL), one 5 oz glass of wine (148 mL), or one 1 oz glass of hard liquor (44 mL). General instructions  Do not use any products that contain nicotine or tobacco, such as cigarettes, e-cigarettes, and chewing tobacco. If you need help quitting, ask your health care provider.  Maintain a healthy weight.  Keep all follow-up visits as told by your health care provider. These may include consultations with a surgeon or specialist. This is important. Where to find more information  National Institute of Diabetes and Digestive and Kidney Diseases: www.niddk.nih.gov Contact a health care provider if:  You think you have had a gallbladder attack.  You have been diagnosed with silent gallstones and you develop pain in your abdomen or indigestion.  You begin to have attacks more often.  You have   dark urine or light-colored stools. Get help right away if:  You have pain from a gallbladder attack that lasts for more than 2 hours.  You have pain in your abdomen that lasts for more than 5 hours or is getting worse.  You have a fever or chills.  You have nausea and vomiting that do not go away.  You develop jaundice. Summary  Cholelithiasis is a disease in which gallstones form in the gallbladder.  This condition may be caused by an imbalance in the different parts that make bile. This can happen if your bile has too much bilirubin or cholesterol, or does not have enough bile salts.  Treatment for gallstones depends on the severity of the condition. Silent  gallstones do not need treatment.  If gallstones cause a gallbladder attack or other symptoms, treatment usually involves not eating or drinking anything. Treatment may also include pain medicines and antibiotics, and it sometimes includes a hospital stay.  Surgery to remove the gallbladder is common if all other treatments have not worked. This information is not intended to replace advice given to you by your health care provider. Make sure you discuss any questions you have with your health care provider. Document Revised: 04/04/2019 Document Reviewed: 04/04/2019 Elsevier Patient Education  2021 [employer].   Minimally Invasive Cholecystectomy Minimally invasive cholecystectomy is surgery to remove the gallbladder. The gallbladder is a pear-shaped organ that lies beneath the liver on the right side of the body. The gallbladder stores bile, which is a fluid that helps the body digest fats. Cholecystectomy is often done to treat inflammation of the gallbladder (cholecystitis). This condition is usually caused by a buildup of gallstones (cholelithiasis) in the gallbladder. Gallstones can block the flow of bile, which can result in inflammation and pain. In severe cases, emergency surgery may be required. This procedure is done though small incisions in the abdomen, instead of one large incision. It is also called laparoscopic surgery. A thin scope with a camera (laparoscope) is inserted through one incision. Then surgical instruments are inserted through the other incisions. In some cases, a minimally invasive surgery may need to be changed to a surgery that is done through a larger incision. This is called open surgery. Tell a health care provider about:  Any allergies you have.  All medicines you are taking, including vitamins, herbs, eye drops, creams, and over-the-counter medicines.  Any problems you or family members have had with anesthetic medicines.  Any blood disorders you  have.  Any surgeries you have had.  Any medical conditions you have.  Whether you are pregnant or may be pregnant. What are the risks? Generally, this is a safe procedure. However, problems may occur, including:  Infection.  Bleeding.  Allergic reactions to medicines.  Damage to nearby structures or organs.  A stone remaining in the common bile duct. The common bile duct carries bile from the gallbladder into the small intestine.  A bile leak from the cyst duct that is clipped when your gallbladder is removed. Medicines Ask your health care provider about:  Changing or stopping your regular medicines. This is especially important if you are taking diabetes medicines or blood thinners.  Taking medicines such as aspirin and ibuprofen. These medicines can thin your blood. Do not take these medicines unless your health care provider tells you to take them.  Taking over-the-counter medicines, vitamins, herbs, and supplements. General instructions  Let your health care provider know if you develop a cold or an infection before  surgery.  Plan to have someone take you home from the hospital or clinic.  If you will be going home right after the procedure, plan to have someone with you for 24 hours.  Ask your health care provider: ? How your surgery site will be marked. ? What steps will be taken to help prevent infection. These may include:  Removing hair at the surgery site.  Washing skin with a germ-killing soap.  Taking antibiotic medicine. What happens during the procedure?  An IV will be inserted into one of your veins.  You will be given one or both of the following: ? A medicine to help you relax (sedative). ? A medicine to make you fall asleep (general anesthetic).  A breathing tube will be placed in your mouth.  Your surgeon will make several small incisions in your abdomen.  The laparoscope will be inserted through one of the small incisions. The camera on  the laparoscope will send images to a monitor in the operating room. This lets your surgeon see inside your abdomen.  A gas will be pumped into your abdomen. This will expand your abdomen to give the surgeon more room to perform the surgery.  Other tools that are needed for the procedure will be inserted through the other incisions. The gallbladder will be removed through one of the incisions.  Your common bile duct may be examined. If stones are found in the common bile duct, they may be removed.  After your gallbladder has been removed, the incisions will be closed with stitches (sutures), staples, or skin glue.  Your incisions may be covered with a bandage (dressing). The procedure may vary among health care providers and hospitals.   What happens after the procedure?  Your blood pressure, heart rate, breathing rate, and blood oxygen level will be monitored until you leave the hospital or clinic.  You will be given medicines as needed to control your pain.  If you were given a sedative during the procedure, it can affect you for several hours. Do not drive or operate machinery until your health care provider says that it is safe. Summary  Minimally invasive cholecystectomy, also called laparoscopic cholecystectomy, is surgery to remove the gallbladder using small incisions.  Tell your health care provider about all the medical conditions you have and all the medicines you are taking for those conditions.  Before the procedure, follow instructions about eating or drinking restrictions and changing or stopping medicines.  If you were given a sedative during the procedure, it can affect you for several hours. Do not drive or operate machinery until your health care provider says that it is safe. This information is not intended to replace advice given to you by your health care provider. Make sure you discuss any questions you have with your health care provider. Document Revised:  02/14/2019 Document Reviewed: 02/14/2019 Elsevier Patient Education  [employer].
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
## 2020-09-06 ENCOUNTER — Encounter: Payer: Self-pay | Admitting: Family Medicine

## 2020-09-10 ENCOUNTER — Other Ambulatory Visit: Payer: Self-pay | Admitting: Family Medicine

## 2020-09-10 DIAGNOSIS — F320 Major depressive disorder, single episode, mild: Secondary | ICD-10-CM

## 2020-09-10 MED ORDER — SERTRALINE HCL 100 MG PO TABS: 100.0000 mg | ORAL_TABLET | Freq: Every day | ORAL | 5 refills | Status: DC

## 2020-09-10 NOTE — Patient Instructions (Signed)
Gina Alvarez  09/10/2020     @PREFPERIOPPHARMACY @   Your procedure is scheduled on  09/14/2020   Report to [facility] at  [email or web address].   Call this number if you have problems the morning of surgery:  [phone]   Remember:  Do not eat or drink after midnight.                       Take these medicines the morning of surgery with A SIP OF WATER  zofran (if needed), zoloft.   Place clean shhets on your bed the night before your surgery and DO NOT sleep with pets this night.  Shower with CHG the night before and the morning of your procedure. DO NOT put CHG on your face, hair or genitals.  After each shower, dry off with a clean towel, put on clean, comfortable clothes and brush your teeth.      Do not wear jewelry, make-up or nail polish.  Do not wear lotions, powders, or perfumes, or deodorant.  Do not shave 48 hours prior to surgery.  Men may shave face and neck.  Do not bring valuables to the hospital.  [facility] is not responsible for any belongings or valuables.   Contacts, dentures or bridgework may not be worn into surgery.  Leave your suitcase in the car.  After surgery it may be brought to your room.  For patients admitted to the hospital, discharge time will be determined by your treatment team.  Patients discharged the day of surgery will not be allowed to drive home and must have someone with them for 24 hours.      Special instructions:  DO NOT smoke tobacco or vape for 24 hours before your procedure.   Please read over the following fact sheets that you were given. Coughing and Deep Breathing, Surgical Site Infection Prevention, Anesthesia Post-op Instructions and Care and Recovery After Surgery       Minimally Invasive Cholecystectomy, Care After This sheet gives you information about how to care for yourself after your procedure. Your health care provider may also give you more specific instructions. If you have problems or questions,  contact your health care provider. What can I expect after the procedure? After the procedure, it is common to have:  Pain at your incision sites. You will be given medicines to control this pain.  Mild nausea or vomiting.  Bloating and possible shoulder pain from the gas that was used during the procedure. Follow these instructions at home: Medicines  Take over-the-counter and prescription medicines only as told by your health care provider.  If you were prescribed an antibiotic medicine, take or use it as told by your health care provider. Do not stop using the antibiotic even if you start to feel better.  Ask your health care provider if the medicine prescribed to you: ? Requires you to avoid driving or using machinery. ? Can cause constipation. You may need to take these actions to prevent or treat constipation:  Drink enough fluid to keep your urine pale yellow.  Take over-the-counter or prescription medicines.  Eat foods that are high in fiber, such as beans, whole grains, and fresh fruits and vegetables.  Limit foods that are high in fat and processed sugars, such as fried or sweet foods. Incision care  Follow instructions from your health care provider about how to take care of your incisions. Make sure you: ?  Wash your hands with soap and water for at least 20 seconds before and after you change your bandage (dressing). If soap and water are not available, use hand sanitizer. ? Change your dressing as told by your health care provider. ? Leave stitches (sutures), skin glue, or adhesive strips in place. These skin closures may need to be in place for 2 weeks or longer. If adhesive strip edges start to loosen and curl up, you may trim the loose edges. Do not remove adhesive strips completely unless your health care provider tells you to do that.  Do not take baths, swim, or use a hot tub until your health care provider approves. Ask your health care provider if you may take  showers. You may only be allowed to take sponge baths.  Check your incision area every day for signs of infection. Check for: ? More redness, swelling, or pain. ? Fluid or blood. ? Warmth. ? Pus or a bad smell.   Activity  Rest as told by your health care provider.  Avoid sitting for a long time without moving. Get up to take short walks every 1-2 hours. This is important to improve blood flow and breathing. Ask for help if you feel weak or unsteady.  Do not lift anything that is heavier than 10 lb (4.5 kg), or the limit that you are told, until your health care provider says that it is safe.  Do not play contact sports until your health care provider approves.  Do not return to work or school until your health care provider approves.  Return to your normal activities as told by your health care provider. Ask your health care provider what activities are safe for you. General instructions  If you were given a sedative during the procedure, it can affect you for several hours. Do not drive or operate machinery until your health care provider says that it is safe.  Keep all follow-up visits as told by your health care provider. This is important. Contact a health care provider if:  You develop a rash.  You have more redness, swelling, or pain around your incisions.  You have fluid or blood coming from your incisions.  Your incisions feel warm to the touch.  You have pus or a bad smell coming from your incisions.  You have a fever.  One or more of your incisions breaks open. Get help right away if:  You have trouble breathing.  You have chest pain.  You have increasing pain in your shoulders.  You faint or feel dizzy when you stand.  You have severe pain in your abdomen.  You have nausea or vomiting that lasts for more than one day.  You have leg pain. Summary  After your procedure, it is common to have pain at the incision sites. You may also have nausea or  bloating.  Follow your health care provider's instructions about medicine, activity restrictions, and caring for your incision areas. Do not do activities that require a lot of effort.  Contact a health care provider if you have a fever or other signs of infection, such as more redness, swelling, or pain around the incisions.  Get help right away if you have chest pain, increasing pain in the shoulders, or trouble breathing. This information is not intended to replace advice given to you by your health care provider. Make sure you discuss any questions you have with your health care provider. Document Revised: 02/09/2019 Document Reviewed: 02/09/2019 Elsevier Patient Education  [place] Anesthesia, Adult, Care After This sheet gives you information about how to care for yourself after your procedure. Your health care provider may also give you more specific instructions. If you have problems or questions, contact your health care provider. What can I expect after the procedure? After the procedure, the following side effects are common:  Pain or discomfort at the IV site.  Nausea.  Vomiting.  Sore throat.  Trouble concentrating.  Feeling cold or chills.  Feeling weak or tired.  Sleepiness and fatigue.  Soreness and body aches. These side effects can affect parts of the body that were not involved in surgery. Follow these instructions at home: For the time period you were told by your health care provider:  Rest.  Do not participate in activities where you could fall or become injured.  Do not drive or use machinery.  Do not drink alcohol.  Do not take sleeping pills or medicines that cause drowsiness.  Do not make important decisions or sign legal documents.  Do not take care of children on your own.   Eating and drinking  Follow any instructions from your health care provider about eating or drinking restrictions.  When you feel hungry, start by  eating small amounts of foods that are soft and easy to digest (bland), such as toast. Gradually return to your regular diet.  Drink enough fluid to keep your urine pale yellow.  If you vomit, rehydrate by drinking water, juice, or clear broth. General instructions  If you have sleep apnea, surgery and certain medicines can increase your risk for breathing problems. Follow instructions from your health care provider about wearing your sleep device: ? Anytime you are sleeping, including during daytime naps. ? While taking prescription pain medicines, sleeping medicines, or medicines that make you drowsy.  Have a responsible adult stay with you for the time you are told. It is important to have someone help care for you until you are awake and alert.  Return to your normal activities as told by your health care provider. Ask your health care provider what activities are safe for you.  Take over-the-counter and prescription medicines only as told by your health care provider.  If you smoke, do not smoke without supervision.  Keep all follow-up visits as told by your health care provider. This is important. Contact a health care provider if:  You have nausea or vomiting that does not get better with medicine.  You cannot eat or drink without vomiting.  You have pain that does not get better with medicine.  You are unable to pass urine.  You develop a skin rash.  You have a fever.  You have redness around your IV site that gets worse. Get help right away if:  You have difficulty breathing.  You have chest pain.  You have blood in your urine or stool, or you vomit blood. Summary  After the procedure, it is common to have a sore throat or nausea. It is also common to feel tired.  Have a responsible adult stay with you for the time you are told. It is important to have someone help care for you until you are awake and alert.  When you feel hungry, start by eating small amounts  of foods that are soft and easy to digest (bland), such as toast. Gradually return to your regular diet.  Drink enough fluid to keep your urine pale yellow.  Return to your normal activities as told by  your health care provider. Ask your health care provider what activities are safe for you. This information is not intended to replace advice given to you by your health care provider. Make sure you discuss any questions you have with your health care provider. Document Revised: 01/26/2020 Document Reviewed: 08/25/2019 Elsevier Patient Education  2021 [employer].

## 2020-09-13 ENCOUNTER — Other Ambulatory Visit: Payer: Self-pay

## 2020-09-13 ENCOUNTER — Encounter (HOSPITAL_COMMUNITY): Payer: Self-pay

## 2020-09-13 ENCOUNTER — Other Ambulatory Visit (HOSPITAL_COMMUNITY)
Admission: RE | Admit: 2020-09-13 | Discharge: 2020-09-13 | Disposition: A | Discharge status: Home / Self Care | Payer: 59 | Source: Ambulatory Visit | Attending: General Surgery | Admitting: General Surgery

## 2020-09-13 ENCOUNTER — Encounter (HOSPITAL_COMMUNITY)
Admission: RE | Admit: 2020-09-13 | Discharge: 2020-09-13 | Disposition: A | Discharge status: Home / Self Care | Payer: 59 | Source: Ambulatory Visit | Attending: General Surgery | Admitting: General Surgery

## 2020-09-13 DIAGNOSIS — Z20822 Contact with and (suspected) exposure to covid-19: Secondary | ICD-10-CM | POA: Insufficient documentation

## 2020-09-13 DIAGNOSIS — Z01812 Encounter for preprocedural laboratory examination: Secondary | ICD-10-CM | POA: Diagnosis not present

## 2020-09-13 HISTORY — DX: Depression, unspecified: F32.A

## 2020-09-13 HISTORY — DX: Anxiety disorder, unspecified: F41.9

## 2020-09-13 LAB — CBC WITH DIFFERENTIAL/PLATELET
Abs Immature Granulocytes: 0.02 10*3/uL (ref 0.00–0.07)
Basophils Absolute: 0 10*3/uL (ref 0.0–0.1)
Basophils Relative: 0 %
Eosinophils Absolute: 0.1 10*3/uL (ref 0.0–0.5)
Eosinophils Relative: 1 %
HCT: 40.2 % (ref 36.0–46.0)
Hemoglobin: 14 g/dL (ref 12.0–15.0)
Immature Granulocytes: 0 %
Lymphocytes Relative: 30 %
Lymphs Abs: 2 10*3/uL (ref 0.7–4.0)
MCH: 32.4 pg (ref 26.0–34.0)
MCHC: 34.8 g/dL (ref 30.0–36.0)
MCV: 93.1 fL (ref 80.0–100.0)
Monocytes Absolute: 0.4 10*3/uL (ref 0.1–1.0)
Monocytes Relative: 6 %
Neutro Abs: 4 10*3/uL (ref 1.7–7.7)
Neutrophils Relative %: 63 %
Platelets: 280 10*3/uL (ref 150–400)
RBC: 4.32 MIL/uL (ref 3.87–5.11)
RDW: 11.5 % (ref 11.5–15.5)
WBC: 6.5 10*3/uL (ref 4.0–10.5)
nRBC: 0 % (ref 0.0–0.2)

## 2020-09-13 LAB — HCG, SERUM, QUALITATIVE: Preg, Serum: NEGATIVE

## 2020-09-13 LAB — SARS CORONAVIRUS 2 (TAT 6-24 HRS): SARS Coronavirus 2: NEGATIVE

## 2020-09-14 ENCOUNTER — Encounter (HOSPITAL_COMMUNITY)
Admission: RE | Disposition: A | Discharge status: Home / Self Care | Payer: Self-pay | Source: Home / Self Care | Attending: General Surgery

## 2020-09-14 ENCOUNTER — Ambulatory Visit (HOSPITAL_COMMUNITY): Payer: 59 | Admitting: Anesthesiology

## 2020-09-14 ENCOUNTER — Encounter (HOSPITAL_COMMUNITY): Payer: Self-pay | Admitting: General Surgery

## 2020-09-14 ENCOUNTER — Ambulatory Visit (HOSPITAL_COMMUNITY)
Admission: RE | Admit: 2020-09-14 | Discharge: 2020-09-14 | Disposition: A | Discharge status: Home / Self Care | Payer: 59 | Attending: General Surgery | Admitting: General Surgery

## 2020-09-14 DIAGNOSIS — Z87891 Personal history of nicotine dependence: Secondary | ICD-10-CM | POA: Insufficient documentation

## 2020-09-14 DIAGNOSIS — K8010 Calculus of gallbladder with chronic cholecystitis without obstruction: Secondary | ICD-10-CM | POA: Insufficient documentation

## 2020-09-14 DIAGNOSIS — Z803 Family history of malignant neoplasm of breast: Secondary | ICD-10-CM | POA: Insufficient documentation

## 2020-09-14 DIAGNOSIS — Z825 Family history of asthma and other chronic lower respiratory diseases: Secondary | ICD-10-CM | POA: Insufficient documentation

## 2020-09-14 DIAGNOSIS — Z809 Family history of malignant neoplasm, unspecified: Secondary | ICD-10-CM | POA: Diagnosis not present

## 2020-09-14 DIAGNOSIS — K8020 Calculus of gallbladder without cholecystitis without obstruction: Secondary | ICD-10-CM

## 2020-09-14 DIAGNOSIS — Z8489 Family history of other specified conditions: Secondary | ICD-10-CM | POA: Diagnosis not present

## 2020-09-14 HISTORY — PX: CHOLECYSTECTOMY: SHX55

## 2020-09-14 SURGERY — LAPAROSCOPIC CHOLECYSTECTOMY
Anesthesia: General | Site: Abdomen

## 2020-09-14 MED ORDER — FENTANYL CITRATE (PF) 100 MCG/2ML IJ SOLN
25.0000 ug | INTRAMUSCULAR | Status: DC | PRN
  Administered 2020-09-14 (×2): 50 ug via INTRAVENOUS
  Filled 2020-09-14: qty 2

## 2020-09-14 MED ORDER — PROPOFOL 10 MG/ML IV BOLUS
INTRAVENOUS | Status: AC
  Filled 2020-09-14: qty 20

## 2020-09-14 MED ORDER — HEMOSTATIC AGENTS (NO CHARGE) OPTIME
TOPICAL | Status: DC | PRN
  Administered 2020-09-14: 1 via TOPICAL

## 2020-09-14 MED ORDER — DEXMEDETOMIDINE (PRECEDEX) IN NS 20 MCG/5ML (4 MCG/ML) IV SYRINGE
PREFILLED_SYRINGE | INTRAVENOUS | Status: DC | PRN
  Administered 2020-09-14 (×2): 8 ug via INTRAVENOUS

## 2020-09-14 MED ORDER — SODIUM CHLORIDE 0.9 % IV SOLN
INTRAVENOUS | Status: AC
  Filled 2020-09-14: qty 2

## 2020-09-14 MED ORDER — FENTANYL CITRATE (PF) 100 MCG/2ML IJ SOLN
INTRAMUSCULAR | Status: DC | PRN
  Administered 2020-09-14 (×2): 50 ug via INTRAVENOUS

## 2020-09-14 MED ORDER — DEXMEDETOMIDINE (PRECEDEX) IN NS 20 MCG/5ML (4 MCG/ML) IV SYRINGE
PREFILLED_SYRINGE | INTRAVENOUS | Status: AC
  Filled 2020-09-14: qty 5

## 2020-09-14 MED ORDER — FENTANYL CITRATE (PF) 100 MCG/2ML IJ SOLN
INTRAMUSCULAR | Status: AC
  Filled 2020-09-14: qty 2

## 2020-09-14 MED ORDER — DEXAMETHASONE SODIUM PHOSPHATE 10 MG/ML IJ SOLN
INTRAMUSCULAR | Status: AC
  Filled 2020-09-14: qty 1

## 2020-09-14 MED ORDER — CHLORHEXIDINE GLUCONATE CLOTH 2 % EX PADS: 6.0000 | MEDICATED_PAD | Freq: Once | CUTANEOUS | Status: DC

## 2020-09-14 MED ORDER — ONDANSETRON 4 MG PO TBDP: 4.0000 mg | ORAL_TABLET | Freq: Three times a day (TID) | ORAL | 0 refills | Status: DC | PRN

## 2020-09-14 MED ORDER — OXYCODONE HCL 5 MG PO TABS: 5.0000 mg | ORAL_TABLET | ORAL | 0 refills | Status: DC | PRN

## 2020-09-14 MED ORDER — LACTATED RINGERS IV SOLN
INTRAVENOUS | Status: DC
  Administered 2020-09-14: 1000 mL via INTRAVENOUS

## 2020-09-14 MED ORDER — MIDAZOLAM HCL 5 MG/5ML IJ SOLN
INTRAMUSCULAR | Status: DC | PRN
  Administered 2020-09-14: 2 mg via INTRAVENOUS

## 2020-09-14 MED ORDER — ONDANSETRON HCL 4 MG/2ML IJ SOLN
4.0000 mg | Freq: Once | INTRAMUSCULAR | Status: AC | PRN
  Administered 2020-09-14: 4 mg via INTRAVENOUS
  Filled 2020-09-14: qty 2

## 2020-09-14 MED ORDER — SODIUM CHLORIDE 0.9 % IR SOLN
Status: DC | PRN
  Administered 2020-09-14: 1000 mL

## 2020-09-14 MED ORDER — SCOPOLAMINE 1 MG/3DAYS TD PT72
MEDICATED_PATCH | TRANSDERMAL | Status: AC
  Filled 2020-09-14: qty 1

## 2020-09-14 MED ORDER — SUGAMMADEX SODIUM 200 MG/2ML IV SOLN
INTRAVENOUS | Status: DC | PRN
  Administered 2020-09-14: 200 mg via INTRAVENOUS

## 2020-09-14 MED ORDER — OXYCODONE HCL 5 MG PO TABS
5.0000 mg | ORAL_TABLET | Freq: Once | ORAL | Status: AC
  Administered 2020-09-14: 5 mg via ORAL

## 2020-09-14 MED ORDER — MIDAZOLAM HCL 2 MG/2ML IJ SOLN
INTRAMUSCULAR | Status: AC
  Filled 2020-09-14: qty 2

## 2020-09-14 MED ORDER — PROPOFOL 10 MG/ML IV BOLUS
INTRAVENOUS | Status: DC | PRN
  Administered 2020-09-14: 180 mg via INTRAVENOUS

## 2020-09-14 MED ORDER — SODIUM CHLORIDE 0.9 % IV SOLN
2.0000 g | INTRAVENOUS | Status: AC
  Administered 2020-09-14: 2 g via INTRAVENOUS

## 2020-09-14 MED ORDER — LIDOCAINE HCL (PF) 2 % IJ SOLN
INTRAMUSCULAR | Status: AC
  Filled 2020-09-14: qty 5

## 2020-09-14 MED ORDER — ONDANSETRON HCL 4 MG/2ML IJ SOLN
INTRAMUSCULAR | Status: AC
  Filled 2020-09-14: qty 2

## 2020-09-14 MED ORDER — BUPIVACAINE HCL (PF) 0.5 % IJ SOLN
INTRAMUSCULAR | Status: AC
  Filled 2020-09-14: qty 30

## 2020-09-14 MED ORDER — OXYCODONE HCL 5 MG PO TABS
ORAL_TABLET | ORAL | Status: AC
  Filled 2020-09-14: qty 1

## 2020-09-14 MED ORDER — ONDANSETRON HCL 4 MG/2ML IJ SOLN
INTRAMUSCULAR | Status: DC | PRN
  Administered 2020-09-14: 4 mg via INTRAVENOUS

## 2020-09-14 MED ORDER — SCOPOLAMINE 1 MG/3DAYS TD PT72
1.0000 | MEDICATED_PATCH | Freq: Once | TRANSDERMAL | Status: DC
  Administered 2020-09-14: 1.5 mg via TRANSDERMAL

## 2020-09-14 MED ORDER — PHENYLEPHRINE 40 MCG/ML (10ML) SYRINGE FOR IV PUSH (FOR BLOOD PRESSURE SUPPORT)
PREFILLED_SYRINGE | INTRAVENOUS | Status: DC | PRN
  Administered 2020-09-14: 80 ug via INTRAVENOUS

## 2020-09-14 MED ORDER — LIDOCAINE HCL (CARDIAC) PF 100 MG/5ML IV SOSY
PREFILLED_SYRINGE | INTRAVENOUS | Status: DC | PRN
  Administered 2020-09-14: 60 mg via INTRAVENOUS

## 2020-09-14 MED ORDER — ORAL CARE MOUTH RINSE: 15.0000 mL | Freq: Once | OROMUCOSAL | Status: AC

## 2020-09-14 MED ORDER — BUPIVACAINE HCL (PF) 0.5 % IJ SOLN
INTRAMUSCULAR | Status: DC | PRN
  Administered 2020-09-14: 10 mL

## 2020-09-14 MED ORDER — ROCURONIUM BROMIDE 10 MG/ML (PF) SYRINGE
PREFILLED_SYRINGE | INTRAVENOUS | Status: DC | PRN
  Administered 2020-09-14: 50 mg via INTRAVENOUS

## 2020-09-14 MED ORDER — KETOROLAC TROMETHAMINE 30 MG/ML IJ SOLN
INTRAMUSCULAR | Status: DC | PRN
  Administered 2020-09-14: 30 mg via INTRAVENOUS

## 2020-09-14 MED ORDER — ROCURONIUM BROMIDE 10 MG/ML (PF) SYRINGE
PREFILLED_SYRINGE | INTRAVENOUS | Status: AC
  Filled 2020-09-14: qty 10

## 2020-09-14 MED ORDER — CHLORHEXIDINE GLUCONATE 0.12 % MT SOLN
15.0000 mL | Freq: Once | OROMUCOSAL | Status: AC
  Administered 2020-09-14: 15 mL via OROMUCOSAL

## 2020-09-14 MED ORDER — DEXAMETHASONE SODIUM PHOSPHATE 4 MG/ML IJ SOLN
INTRAMUSCULAR | Status: DC | PRN
  Administered 2020-09-14: 10 mg via INTRAVENOUS

## 2020-09-14 SURGICAL SUPPLY — 42 items
ADH SKN CLS APL DERMABOND .7 (GAUZE/BANDAGES/DRESSINGS) ×1
APL PRP STRL LF DISP 70% ISPRP (MISCELLANEOUS) ×1
APPLIER CLIP ROT 10 11.4 M/L (STAPLE) ×2
APR CLP MED LRG 11.4X10 (STAPLE) ×1
BAG RETRIEVAL 10 (BASKET) ×1
BLADE SURG 15 STRL LF DISP TIS (BLADE) ×1 IMPLANT
BLADE SURG 15 STRL SS (BLADE) ×2
CHLORAPREP W/TINT 26 (MISCELLANEOUS) ×2 IMPLANT
CLIP APPLIE ROT 10 11.4 M/L (STAPLE) ×1 IMPLANT
CLOTH BEACON ORANGE TIMEOUT ST (SAFETY) ×2 IMPLANT
COVER LIGHT HANDLE STERIS (MISCELLANEOUS) ×4 IMPLANT
COVER WAND RF STERILE (DRAPES) ×2 IMPLANT
DECANTER SPIKE VIAL GLASS SM (MISCELLANEOUS) ×2 IMPLANT
DERMABOND ADVANCED (GAUZE/BANDAGES/DRESSINGS) ×1
DERMABOND ADVANCED .7 DNX12 (GAUZE/BANDAGES/DRESSINGS) ×1 IMPLANT
ELECT REM PT RETURN 9FT ADLT (ELECTROSURGICAL) ×2
ELECTRODE REM PT RTRN 9FT ADLT (ELECTROSURGICAL) ×1 IMPLANT
GLOVE SURG ENC MOIS LTX SZ6.5 (GLOVE) ×2 IMPLANT
GLOVE SURG UNDER POLY LF SZ6.5 (GLOVE) ×2 IMPLANT
GLOVE SURG UNDER POLY LF SZ7 (GLOVE) ×6 IMPLANT
GOWN STRL REUS W/TWL LRG LVL3 (GOWN DISPOSABLE) ×6 IMPLANT
HEMOSTAT SNOW SURGICEL 2X4 (HEMOSTASIS) ×2 IMPLANT
INST SET LAPROSCOPIC AP (KITS) ×2 IMPLANT
KIT TURNOVER KIT A (KITS) ×2 IMPLANT
MANIFOLD NEPTUNE II (INSTRUMENTS) ×2 IMPLANT
NDL INSUFFLATION 14GA 120MM (NEEDLE) ×1 IMPLANT
NEEDLE INSUFFLATION 14GA 120MM (NEEDLE) ×2 IMPLANT
NS IRRIG 1000ML POUR BTL (IV SOLUTION) ×2 IMPLANT
PACK LAP CHOLE LZT030E (CUSTOM PROCEDURE TRAY) ×2 IMPLANT
PAD ARMBOARD 7.5X6 YLW CONV (MISCELLANEOUS) ×2 IMPLANT
SET BASIN LINEN APH (SET/KITS/TRAYS/PACK) ×2 IMPLANT
SET TUBE SMOKE EVAC HIGH FLOW (TUBING) ×2 IMPLANT
SLEEVE ENDOPATH XCEL 5M (ENDOMECHANICALS) ×2 IMPLANT
SUT MNCRL AB 4-0 PS2 18 (SUTURE) ×4 IMPLANT
SUT VICRYL 0 UR6 27IN ABS (SUTURE) ×2 IMPLANT
SYS BAG RETRIEVAL 10MM (BASKET) ×1
SYSTEM BAG RETRIEVAL 10MM (BASKET) ×1 IMPLANT
TROCAR ENDO BLADELESS 11MM (ENDOMECHANICALS) ×2 IMPLANT
TROCAR XCEL NON-BLD 5MMX100MML (ENDOMECHANICALS) ×2 IMPLANT
TROCAR XCEL UNIV SLVE 11M 100M (ENDOMECHANICALS) ×2 IMPLANT
TUBE CONNECTING 12X1/4 (SUCTIONS) ×2 IMPLANT
WARMER LAPAROSCOPE (MISCELLANEOUS) ×2 IMPLANT

## 2020-09-14 NOTE — Anesthesia Preprocedure Evaluation (Signed)
Anesthesia Evaluation  Patient identified by MRN, date of birth, ID band Patient awake    Reviewed: Allergy & Precautions, H&P , NPO status , Patient's Chart, lab work & pertinent test results, reviewed documented beta blocker date and time   Airway Mallampati: I  TM Distance: >3 FB Neck ROM: full    Dental no notable dental hx.    Pulmonary neg pulmonary ROS, former smoker,    Pulmonary exam normal breath sounds clear to auscultation       Cardiovascular Exercise Tolerance: Good negative cardio ROS   Rhythm:regular Rate:Normal     Neuro/Psych PSYCHIATRIC DISORDERS Anxiety Depression negative neurological ROS     GI/Hepatic negative GI ROS, Neg liver ROS,   Endo/Other  negative endocrine ROS  Renal/GU negative Renal ROS  negative genitourinary   Musculoskeletal   Abdominal   Peds  Hematology negative hematology ROS (+)   Anesthesia Other Findings   Reproductive/Obstetrics negative OB ROS                             Anesthesia Physical Anesthesia Plan  ASA: II  Anesthesia Plan: General   Post-op Pain Management:    Induction:   PONV Risk Score and Plan: Ondansetron and Scopolamine patch - Pre-op  Airway Management Planned:   Additional Equipment:   Intra-op Plan:   Post-operative Plan:   Informed Consent: I have reviewed the patients History and Physical, chart, labs and discussed the procedure including the risks, benefits and alternatives for the proposed anesthesia with the patient or authorized representative who has indicated his/her understanding and acceptance.     Dental Advisory Given  Plan Discussed with: CRNA  Anesthesia Plan Comments:         Anesthesia Quick Evaluation

## 2020-09-14 NOTE — Transfer of Care (Signed)
Immediate Anesthesia Transfer of Care Note  Patient: AGAM TUOHY  Procedure(s) Performed: LAPAROSCOPIC CHOLECYSTECTOMY (N/A Abdomen)  Patient Location: PACU  Anesthesia Type:General  Level of Consciousness: awake and alert   Airway & Oxygen Therapy: Patient Spontanous Breathing and Patient connected to nasal cannula oxygen  Post-op Assessment: Report given to RN and Post -op Vital signs reviewed and stable  Post vital signs: Reviewed and stable  Last Vitals:  Vitals Value Taken Time  BP 111/69 09/14/20 1040  Temp    Pulse 65 09/14/20 1041  Resp 9 09/14/20 1041  SpO2 96 % 09/14/20 1041  Vitals shown include unvalidated device data.  Last Pain:  Vitals:   09/14/20 0920  TempSrc: Oral  PainSc: 0-No pain      Patients Stated Pain Goal: 7 (82/64/15 8309)  Complications: No complications documented.

## 2020-09-14 NOTE — Progress Notes (Signed)
[facility]  Notified friend Darleen Crocker that surgery as completed. Rx sent to [facility]. Post op phone call in 2 weeks.   Curlene Labrum, MD [facility] [address] [place], [place] [address] [phone] (office)

## 2020-09-14 NOTE — Anesthesia Procedure Notes (Signed)
Procedure Name: Intubation Date/Time: 09/14/2020 9:50 AM Performed by: Lieutenant Diego, CRNA Pre-anesthesia Checklist: Patient identified, Emergency Drugs available, Suction available and Patient being monitored Patient Re-evaluated:Patient Re-evaluated prior to induction Oxygen Delivery Method: Circle system utilized Preoxygenation: Pre-oxygenation with 100% oxygen Induction Type: IV induction Ventilation: Mask ventilation without difficulty Laryngoscope Size: Miller and 2 Grade View: Grade I Tube type: Oral Tube size: 7.0 mm Number of attempts: 1 Airway Equipment and Method: Stylet and Oral airway Placement Confirmation: ETT inserted through vocal cords under direct vision,  positive ETCO2 and breath sounds checked- equal and bilateral Secured at: 22 cm Tube secured with: Tape Dental Injury: Teeth and Oropharynx as per pre-operative assessment

## 2020-09-14 NOTE — Progress Notes (Signed)
Please excuse Gina Alvarez from work until after cleared by doctor. She is to follow up with her Doctor on Sep 27, 2020.  She cannot drive, operate machinery, sign legal documents for 24 hours after procedures or if taking specific medications.

## 2020-09-14 NOTE — Discharge Instructions (Signed)
Discharge Laparoscopic Surgery Instructions:  Common Complaints: Right shoulder pain is common after laparoscopic surgery. This is secondary to the gas used in the surgery being trapped under the diaphragm.  Walk to help your body absorb the gas. This will improve in a few days. Pain at the port sites are common, especially the larger port sites. This will improve with time.  Some nausea is common and poor appetite. The main goal is to stay hydrated the first few days after surgery.   Diet/ Activity: Diet as tolerated. You may not have an appetite, but it is important to stay hydrated. Drink 64 ounces of water a day. Your appetite will return with time.  Shower per your regular routine daily.  Do not take hot showers. Take warm showers that are less than 10 minutes. Rest and listen to your body, but do not remain in bed all day.  Walk everyday for at least 15-20 minutes. Deep cough and move around every 1-2 hours in the first few days after surgery.  Do not lift > 10 lbs, perform excessive bending, pushing, pulling, squatting for 1-2 weeks after surgery.  Do not pick at the dermabond glue on your incision sites.  This glue film will remain in place for 1-2 weeks and will start to peel off.  Do not place lotions or balms on your incision unless instructed to specifically by Dr. Bridges.   Pain Expectations and Narcotics: -After surgery you will have pain associated with your incisions and this is normal. The pain is muscular and nerve pain, and will get better with time. -You are encouraged and expected to take non narcotic medications like tylenol and ibuprofen (when able) to treat pain as multiple modalities can aid with pain treatment. -Narcotics are only used when pain is severe or there is breakthrough pain. -You are not expected to have a pain score of 0 after surgery, as we cannot prevent pain. A pain score of 3-4 that allows you to be functional, move, walk, and tolerate some activity is  the goal. The pain will continue to improve over the days after surgery and is dependent on your surgery. -Due to [place] law, we are only able to give a certain amount of pain medication to treat post operative pain, and we only give additional narcotics on a patient by patient basis.  -For most laparoscopic surgery, studies have shown that the majority of patients only need 10-15 narcotic pills, and for open surgeries most patients only need 15-20.   -Having appropriate expectations of pain and knowledge of pain management with non narcotics is important as we do not want anyone to become addicted to narcotic pain medication.  -Using ice packs in the first 48 hours and heating pads after 48 hours, wearing an abdominal binder (when recommended), and using over the counter medications are all ways to help with pain management.   -Simple acts like meditation and mindfulness practices after surgery can also help with pain control and research has proven the benefit of these practices.  Medication: Take tylenol and ibuprofen as needed for pain control, alternating every 4-6 hours.  Example:  Tylenol 1000mg @ 6am, 12noon, 6pm, 12midnight (Do not exceed 4000mg of tylenol a day). Ibuprofen 800mg @ 9am, 3pm, 9pm, 3am (Do not exceed 3600mg of ibuprofen a day).  Take Roxicodone for breakthrough pain every 4 hours.  Take Colace for constipation related to narcotic pain medication. If you do not have a bowel movement in 2 days, take Miralax   over the counter.  Drink plenty of water to also prevent constipation.   Contact Information: If you have questions or concerns, please call our office, [phone], Monday- Thursday 8AM-5PM and Friday 8AM-12Noon.  If it is after hours or on the weekend, please call [facility], [phone], [phone], and ask to speak to the surgeon on call for Dr. Constance Haw at [facility].    Minimally Invasive Cholecystectomy, Care After This sheet gives you information about how  to care for yourself after your procedure. Your doctor may also give you more specific instructions. If you have problems or questions, contact your doctor. What can I expect after the procedure? After the procedure, it is common:  To have pain at the areas of surgery. You will be given medicines for pain.  To vomit or feel like you may vomit.  To feel fullness in the belly (bloating) or to have pain in the shoulder. This comes from the gas that was used during the surgery. Follow these instructions at home: Medicines  Take over-the-counter and prescription medicines only as told by your doctor.  If you were prescribed an antibiotic medicine, take it as told by your doctor. Do not stop using the antibiotic even if you start to feel better.  Ask your doctor if the medicine prescribed to you: ? Requires you to avoid driving or using machinery. ? Can cause trouble pooping (constipation). You may need to take these actions to prevent or treat trouble pooping:  Drink enough fluid to keep your pee (urine) pale yellow.  Take over-the-counter or prescription medicines.  Eat foods that are high in fiber. These include beans, whole grains, and fresh fruits and vegetables.  Limit foods that are high in fat and sugar. These include fried or sweet foods. Incision care ? Follow instructions from your doctor about how to take care of your cuts from surgery (incisions).   Do not take baths, swim, or use a hot tub until your doctor approves. You may shower.   Check your surgery area every day for signs of infection. Check for: ? More redness, swelling, or pain. ? Fluid or blood. ? Warmth. ? Pus or a bad smell.   Activity  Rest as told by your doctor.  Do not sit for a long time without moving. Get up to take short walks every 1-2 hours. This is important. Ask for help if you feel weak or unsteady.  Do not lift anything that is heavier than 10 lb (4.5 kg), or the limit that you are told,  until your doctor says that it is safe.  Do not play contact sports until your doctor says it is okay.  Do not return to work or school until your doctor says it is okay.  Return to your normal activities as told by your doctor. Ask your doctor what activities are safe for you. General instructions  If you were given a medicine to help you relax (sedative) during your procedure, it can affect you for many hours. Do not drive or use machinery until your doctor says that it is safe.  Keep all follow-up visits as told by your doctor. This is important. Contact a doctor if:  You get a rash.  You have more redness, swelling, or pain around your cuts from surgery.  You have fluid or blood coming from your cuts from surgery.  Your cuts from surgery feel warm to the touch.  You have pus or a bad smell coming from your cuts  from surgery.  You have a fever.  One or more of your cuts from surgery breaks open. Get help right away if:  You have trouble breathing.  You have chest pain.  You have pain that is getting worse in your shoulders.  You faint or feel dizzy when you stand.  You have very bad pain in your belly (abdomen).  You feel like you may vomit or you vomit, and this lasts for more than one day.  You have leg pain. Summary  After your surgery, it is common to have pain at the areas of surgery. You may also have vomiting or fullness in the belly.  Follow your doctor's instructions about medicine, activity restrictions, and caring for your surgery areas. Do not do activities that require a lot of effort.  Contact a doctor if you have a fever or other signs of infection, such as more redness, swelling, or pain around the cuts from surgery.  Get help right away if you have chest pain, increasing pain in the shoulders, or trouble breathing. This information is not intended to replace advice given to you by your health care provider. Make sure you discuss any questions you  have with your health care provider. Document Revised: 04/26/2019 Document Reviewed: 04/26/2019 Elsevier Patient Education  2021 [place] Anesthesia, Adult, Care After This sheet gives you information about how to care for yourself after your procedure. Your health care provider may also give you more specific instructions. If you have problems or questions, contact your health care provider. What can I expect after the procedure? After the procedure, the following side effects are common:  Pain or discomfort at the IV site.  Nausea.  Vomiting.  Sore throat.  Trouble concentrating.  Feeling cold or chills.  Feeling weak or tired.  Sleepiness and fatigue.  Soreness and body aches. These side effects can affect parts of the body that were not involved in surgery. Follow these instructions at home: For the time period you were told by your health care provider:  Rest.  Do not participate in activities where you could fall or become injured.  Do not drive or use machinery.  Do not drink alcohol.  Do not take sleeping pills or medicines that cause drowsiness.  Do not make important decisions or sign legal documents.  Do not take care of children on your own.   Eating and drinking  Follow any instructions from your health care provider about eating or drinking restrictions.  When you feel hungry, start by eating small amounts of foods that are soft and easy to digest (bland), such as toast. Gradually return to your regular diet.  Drink enough fluid to keep your urine pale yellow.  If you vomit, rehydrate by drinking water, juice, or clear broth. General instructions  If you have sleep apnea, surgery and certain medicines can increase your risk for breathing problems. Follow instructions from your health care provider about wearing your sleep device: ? Anytime you are sleeping, including during daytime naps. ? While taking prescription pain  medicines, sleeping medicines, or medicines that make you drowsy.  Have a responsible adult stay with you for the time you are told. It is important to have someone help care for you until you are awake and alert.  Return to your normal activities as told by your health care provider. Ask your health care provider what activities are safe for you.  Take over-the-counter and prescription medicines only as told  by your health care provider.  If you smoke, do not smoke without supervision.  Keep all follow-up visits as told by your health care provider. This is important. Contact a health care provider if:  You have nausea or vomiting that does not get better with medicine.  You cannot eat or drink without vomiting.  You have pain that does not get better with medicine.  You are unable to pass urine.  You develop a skin rash.  You have a fever.  You have redness around your IV site that gets worse. Get help right away if:  You have difficulty breathing.  You have chest pain.  You have blood in your urine or stool, or you vomit blood. Summary  After the procedure, it is common to have a sore throat or nausea. It is also common to feel tired.  Have a responsible adult stay with you for the time you are told. It is important to have someone help care for you until you are awake and alert.  When you feel hungry, start by eating small amounts of foods that are soft and easy to digest (bland), such as toast. Gradually return to your regular diet.  Drink enough fluid to keep your urine pale yellow.  Return to your normal activities as told by your health care provider. Ask your health care provider what activities are safe for you. This information is not intended to replace advice given to you by your health care provider. Make sure you discuss any questions you have with your health care provider. Document Revised: 01/26/2020 Document Reviewed: 08/25/2019 Elsevier Patient  Education  2021 [place] PATCH BEHIND YOUR LEFT EAR ON Monday September 17, 2020. [place] HANDS AFTER REMOVAL    Scopolamine skin patches What is this medicine? SCOPOLAMINE (skoe POL a meen) is used to prevent nausea and vomiting caused by motion sickness, anesthesia and surgery. This medicine may be used for other purposes; ask your health care provider or pharmacist if you have questions. COMMON BRAND NAME(S): Transderm Scop What should I tell my health care provider before I take this medicine? They need to know if you have any of these conditions:  are scheduled to have a gastric secretion test  glaucoma  heart disease  kidney disease  liver disease  lung or breathing disease, like asthma  mental illness  prostate disease  seizures  stomach or intestine problems  trouble passing urine  an unusual or allergic reaction to scopolamine, atropine, other medicines, foods, dyes, or preservatives  pregnant or trying to get pregnant  breast-feeding How should I use this medicine? This medicine is for external use only. Follow the directions on the prescription label. Wear only 1 patch at a time. Choose an area behind the ear, that is clean, dry, hairless and free from any cuts or irritation. Wipe the area with a clean dry tissue. Peel off the plastic backing of the skin patch, trying not to touch the adhesive side with your hands. Do not cut the patches. Firmly apply to the area you have chosen, with the metallic side of the patch to the skin and the tan-colored side showing. Once firmly in place, wash your hands well with soap and water. Do not get this medicine into your eyes. After removing the patch, wash your hands and the area behind your ear thoroughly with soap and water. The patch will still contain some medicine after use. To avoid accidental contact or  ingestion by children or pets, fold the used patch in half with the sticky side  together and throw away in the trash out of the reach of children and pets. If you need to use a second patch after you remove the first, place it behind the other ear. A special MedGuide will be given to you by the pharmacist with each prescription and refill. Be sure to read this information carefully each time. Talk to your pediatrician regarding the use of this medicine in children. Special care may be needed. Overdosage: If you think you have taken too much of this medicine contact a poison control center or emergency room at once. NOTE: This medicine is only for you. Do not share this medicine with others. What if I miss a dose? This does not apply. This medicine is not for regular use. What may interact with this medicine?  alcohol  antihistamines for allergy cough and cold  atropine  certain medicines for anxiety or sleep  certain medicines for bladder problems like oxybutynin, tolterodine  certain medicines for depression like amitriptyline, fluoxetine, sertraline  certain medicines for stomach problems like dicyclomine, hyoscyamine  certain medicines for Parkinson's disease like benztropine, trihexyphenidyl  certain medicines for seizures like phenobarbital, primidone  general anesthetics like halothane, isoflurane, methoxyflurane, propofol  ipratropium  local anesthetics like lidocaine, pramoxine, tetracaine  medicines that relax muscles for surgery  phenothiazines like chlorpromazine, mesoridazine, prochlorperazine, thioridazine  narcotic medicines for pain  other belladonna alkaloids This list may not describe all possible interactions. Give your health care provider a list of all the medicines, herbs, non-prescription drugs, or dietary supplements you use. Also tell them if you smoke, drink alcohol, or use illegal drugs. Some items may interact with your medicine. What should I watch for while using this medicine? Limit contact with water while swimming and  bathing because the patch may fall off. If the patch falls off, throw it away and put a new one behind the other ear. You may get drowsy or dizzy. Do not drive, use machinery, or do anything that needs mental alertness until you know how this medicine affects you. Do not stand or sit up quickly, especially if you are an older patient. This reduces the risk of dizzy or fainting spells. Alcohol may interfere with the effect of this medicine. Avoid alcoholic drinks. Your mouth may get dry. Chewing sugarless gum or sucking hard candy, and drinking plenty of water may help. Contact your healthcare professional if the problem does not go away or is severe. This medicine may cause dry eyes and blurred vision. If you wear contact lenses, you may feel some discomfort. Lubricating drops may help. See your healthcare professional if the problem does not go away or is severe. If you are going to need surgery, an MRI, CT scan, or other procedure, tell your healthcare professional that you are using this medicine. You may need to remove the patch before the procedure. What side effects may I notice from receiving this medicine? Side effects that you should report to your doctor or health care professional as soon as possible:  allergic reactions like skin rash, itching or hives; swelling of the face, lips, or tongue  blurred vision  changes in vision  confusion  dizziness  eye pain  fast, irregular heartbeat  hallucinations, loss of contact with reality  nausea, vomiting  pain or trouble passing urine  restlessness  seizures  skin irritation  stomach pain Side effects that usually do not require medical  attention (report to your doctor or health care professional if they continue or are bothersome):  drowsiness  dry mouth  headache  sore throat This list may not describe all possible side effects. Call your doctor for medical advice about side effects. You may report side effects to FDA  at 1-800-FDA-1088. Where should I keep my medicine? Keep out of the reach of children. Store at room temperature between 20 and 25 degrees C (68 and 77 degrees F). Keep this medicine in the foil package until ready to use. Throw away any unused medicine after the expiration date. NOTE: This sheet is a summary. It may not cover all possible information. If you have questions about this medicine, talk to your doctor, pharmacist, or health care provider.  2021 Elsevier/Gold Standard (2017-07-31 16:14:46)

## 2020-09-14 NOTE — Anesthesia Postprocedure Evaluation (Signed)
Anesthesia Post Note  Patient: Gina Alvarez  Procedure(s) Performed: LAPAROSCOPIC CHOLECYSTECTOMY (N/A Abdomen)  Patient location during evaluation: Phase II Anesthesia Type: General Level of consciousness: awake Pain management: pain level controlled Vital Signs Assessment: post-procedure vital signs reviewed and stable Respiratory status: spontaneous breathing and respiratory function stable Cardiovascular status: blood pressure returned to baseline and stable Postop Assessment: no headache and no apparent nausea or vomiting Anesthetic complications: no Comments: Late entry   No complications documented.   Last Vitals:  Vitals:   09/14/20 1130 09/14/20 1212  BP: 103/67 99/63  Pulse: 72 71  Resp: 14 16  Temp:  36.6 C  SpO2: 100% 97%    Last Pain:  Vitals:   09/14/20 1259  TempSrc:   PainSc: [place]

## 2020-09-14 NOTE — Op Note (Signed)
Operative Note   Preoperative Diagnosis: Symptomatic cholelithiasis   Postoperative Diagnosis: Same   Procedure(s) Performed: Laparoscopic cholecystectomy   Surgeon: Ria Comment C. Constance Haw, MD   Assistants: Aviva Signs, MD    Anesthesia: General endotracheal   Anesthesiologist: Louann Sjogren, MD    Specimens: Gallbladder    Estimated Blood Loss: Minimal    Blood Replacement: None    Complications: None    Operative Findings:  Normal appearing gallbladder    Procedure: The patient was taken to the operating room and placed supine. General endotracheal anesthesia was induced. Intravenous antibiotics were administered per protocol. An orogastric tube positioned to decompress the stomach. The abdomen was prepared and draped in the usual sterile fashion.    A supraumbilical incision was made and a Veress technique was utilized to achieve pneumoperitoneum to 15 mmHg with carbon dioxide. A 11 mm optiview port was placed through the supraumbilical region, and a 10 mm 0-degree operative laparoscope was introduced. The area underlying the trocar and Veress needle were inspected and without evidence of injury.  Remaining trocars were placed under direct vision. Two 5 mm ports were placed in the right abdomen, between the anterior axillary and midclavicular line.  A final 11 mm port was placed through the mid-epigastrium, near the falciform ligament.    The gallbladder fundus was elevated cephalad and the infundibulum was retracted to the patient's right. The gallbladder/cystic duct junction was skeletonized. The cystic artery noted in the triangle of Calot and was also skeletonized.  We then continued liberal medial and lateral dissection until the critical view of safety was achieved.  There was a small vein overlying the cystic duct was doubly clipped and divided to ensure it did not bleed.      The cystic duct and cystic artery were triply clipped and divided.The gallbladder was then dissected  from the liver bed with electrocautery. The specimen was placed in an Endopouch and was retrieved through the epigastric site.   Final inspection revealed acceptable hemostasis. Surgical SNOW was placed in the gallbladder bed.  Trocars were removed and pneumoperitoneum was released.  The epigastric and umbilical port sites were smaller than the tip of my finger. Skin incisions were closed with 4-0 Monocryl subcuticular sutures and Dermabond. The patient was awakened from anesthesia and extubated without complication.    Curlene Labrum, MD [facility] [address] [place], [place] [address] [phone] (office)

## 2020-09-14 NOTE — Interval H&P Note (Signed)
History and Physical Interval Note:  09/14/2020 9:34 AM  Gina Alvarez  has presented today for surgery, with the diagnosis of Cholelithiasis.  The various methods of treatment have been discussed with the patient and family. After consideration of risks, benefits and other options for treatment, the patient has consented to  Procedure(s): LAPAROSCOPIC CHOLECYSTECTOMY (N/A) as a surgical intervention.  The patient's history has been reviewed, patient examined, no change in status, stable for surgery.  I have reviewed the patient's chart and labs.  Questions were answered to the patient's satisfaction.     Virl Cagey

## 2020-09-17 ENCOUNTER — Encounter (HOSPITAL_COMMUNITY): Payer: Self-pay | Admitting: General Surgery

## 2020-09-17 LAB — SURGICAL PATHOLOGY

## 2020-09-27 ENCOUNTER — Telehealth (INDEPENDENT_AMBULATORY_CARE_PROVIDER_SITE_OTHER): Payer: Self-pay | Admitting: General Surgery

## 2020-09-27 ENCOUNTER — Other Ambulatory Visit: Payer: Self-pay

## 2020-09-27 DIAGNOSIS — K8020 Calculus of gallbladder without cholecystitis without obstruction: Secondary | ICD-10-CM

## 2020-09-27 NOTE — Progress Notes (Signed)
[facility]  I am calling the patient for post operative evaluation. This is not a billable encounter as it is under the [place] charges for the surgery.  The patient had a laparoscopic cholecystectomy on 09/14/2020. The patient reports that she is doing well. The are tolerating a diet, having good pain control, and having regular Bms for her.  The incisions are healing well. The patient has no concerns.   Pathology: FINAL MICROSCOPIC DIAGNOSIS:   A. GALLBLADDER, CHOLECYSTECTOMY:  - Chronic cholecystitis.  - Cholelithiasis.  - Benign hemorrhagic lymph node.   Will see the patient PRN.   Curlene Labrum, MD [facility] [address] [place], [place] [address] [phone] (office)

## 2020-09-27 NOTE — Progress Notes (Signed)
To whom it may concern,  Gina Alvarez may return to work without restrictions on 10/01/2020. She has been following surgery on 09/14/2020.   If you have questions please call the office.  Curlene Labrum, MD [facility] [address] [place], [place] [address] [phone] (office)

## 2020-10-08 ENCOUNTER — Telehealth: Payer: Self-pay | Admitting: Family Medicine

## 2020-10-08 ENCOUNTER — Other Ambulatory Visit: Payer: Self-pay | Admitting: Family Medicine

## 2020-10-08 DIAGNOSIS — F5104 Psychophysiologic insomnia: Secondary | ICD-10-CM

## 2020-10-08 MED ORDER — RAMELTEON 8 MG PO TABS: 8.0000 mg | ORAL_TABLET | Freq: Every day | ORAL | 1 refills | Status: DC

## 2020-10-08 NOTE — Telephone Encounter (Signed)
Per patient request, response sent to patient via [place]

## 2020-10-08 NOTE — Telephone Encounter (Signed)
Rozerem sent to pharmacy for sleep aid.  If she has not had significant improvement in her anxiety/depression symptoms with sertraline, I would favor switching her off to something else, particularly since she is having worsening sleep.  Please let me know if this is something she wishes to do.

## 2020-10-08 NOTE — Telephone Encounter (Signed)
Patient was seen on 08/10/20 for well woman exam and was prescribed Zoloft at that visit.  She reports it has interfered with her sleep.  You had suggested Melatonin and she has been taking 1 - 2 tablets nightly, 10 mg each.  She does not feel that they are helping and would like to know if you could recommend something else.  Her pharmacy is [facility].  She asks that we reply by Gina Alvarez message because she is going to be at work and will be unable to answer phone call.

## 2020-10-08 NOTE — Telephone Encounter (Signed)
Pt is taking zoloft and it has started impacting her sleep schedule. Dr Darnell Level had suggested melatonin but the pt said she does not feel like that is helping. Please advise.

## 2020-10-09 ENCOUNTER — Other Ambulatory Visit: Payer: Self-pay | Admitting: Family Medicine

## 2020-10-09 DIAGNOSIS — F5104 Psychophysiologic insomnia: Secondary | ICD-10-CM

## 2020-10-09 MED ORDER — TRAZODONE HCL 50 MG PO TABS: 25.0000 mg | ORAL_TABLET | Freq: Every evening | ORAL | 3 refills | Status: DC | PRN

## 2020-10-10 ENCOUNTER — Telehealth: Payer: Self-pay | Admitting: *Deleted

## 2020-10-10 NOTE — Telephone Encounter (Signed)
Trazodone sent already.  Please see previous note.

## 2020-10-10 NOTE — Telephone Encounter (Signed)
Ramelteon 8MG  tablets PA started  Pts plan will only cover this med after step therapy- must trial 2 of these 3 meds 1st.  Eszopiclone (generic Lunesta), Zaleplon (generic Sonata), Zolpidem (generic Ambien   Ambien has been tried in past.

## 2020-10-19 ENCOUNTER — Other Ambulatory Visit: Payer: 59

## 2020-10-19 ENCOUNTER — Ambulatory Visit (INDEPENDENT_AMBULATORY_CARE_PROVIDER_SITE_OTHER): Payer: 59 | Admitting: *Deleted

## 2020-10-19 ENCOUNTER — Other Ambulatory Visit: Payer: Self-pay

## 2020-10-19 ENCOUNTER — Telehealth: Payer: Self-pay | Admitting: Family Medicine

## 2020-10-19 DIAGNOSIS — Z3042 Encounter for surveillance of injectable contraceptive: Secondary | ICD-10-CM

## 2020-10-19 DIAGNOSIS — Z8481 Family history of carrier of genetic disease: Secondary | ICD-10-CM

## 2020-10-19 LAB — PREGNANCY, URINE: Preg Test, Ur: NEGATIVE

## 2020-10-19 MED ORDER — MEDROXYPROGESTERONE ACETATE 150 MG/ML IM SUSY: PREFILLED_SYRINGE | INTRAMUSCULAR | 2 refills | Status: DC

## 2020-10-19 NOTE — Telephone Encounter (Signed)
Patient saw Dr. Lajuana Ripple in March for pap.  Spoke with patient, she has already been in for Depo shot and would like prescription sent to [facility] instead.

## 2020-10-19 NOTE — Progress Notes (Signed)
Pt given Depo Provera IM RUOQ glut and tolerated well.

## 2020-10-19 NOTE — Telephone Encounter (Signed)
  Prescription Request  10/19/2020  What is the name of the medication or equipment? Depo Shot meds  Have you contacted your pharmacy to request a refill? (if applicable) no  Which pharmacy would you like this sent to? [facility]   Patient notified that their request is being sent to the clinical staff for review and that they should receive a response within 2 business days.   Gottschalk's pt.  Pt wants nurse to call her, when depo meds have been called in, so she can make an appt to come in today-this afternoon. She said that she will have to take pregnancy test before getting shot. I told pt that Lajuana Ripple was off today & it would be a different provider to send in.

## 2020-10-31 ENCOUNTER — Telehealth: Payer: Self-pay | Admitting: Family Medicine

## 2020-10-31 ENCOUNTER — Telehealth: Payer: Self-pay

## 2020-10-31 NOTE — Telephone Encounter (Signed)
Absolutely.  I think this would be BRCA2.  Her mom was bringing by copy of lab today and I already spoke with patient to come by and sign her part of the Prior auth form that I placed up front.  Also, included the copies of previous counseling that was performed.  Should be in a folder up front.  Will fax as soon as Meela has time to run by. 

## 2020-10-31 NOTE — Telephone Encounter (Signed)
I received a prior authorization form for the BRCA gene testing that was ordered and collected in May.  Patient has been counseled multiple times and we will attach these previous notes to her prior authorization form.  I have called her mother and she will bring down her pathology results so that we also may send this for additional support of laboratory collection.  Patient again counseled today and she has informed consent with regards to BRCA gene testing.  She would pursue double mastectomy if it BRCA gene were positive we would likely hold off on any cervical interventions since she is still of childbearing age.  She will come by to sign the form and we will submit to her insurance this week.  Gina Alvarez M. Lajuana Ripple, [facility]

## 2020-10-31 NOTE — Telephone Encounter (Signed)
Received a call from angie who is with [facility] about patients lab work.  States that she spoke with patient and she was able to provide them with her moms BRCA results which were positive.  Patient has an order in for BRCA and Angie wanted to know if Dr. Darnell Level wanted to change the order to a smaller test that would like directly for what mom tested positive for.  If so the lab number is [id number].  Angie said if we could just call her back by Friday to let her know if we would like the test switched.  Please advise

## 2020-12-04 ENCOUNTER — Other Ambulatory Visit: Payer: Self-pay | Admitting: Family Medicine

## 2020-12-04 DIAGNOSIS — Z803 Family history of malignant neoplasm of breast: Secondary | ICD-10-CM

## 2020-12-04 DIAGNOSIS — Z1501 Genetic susceptibility to malignant neoplasm of breast: Secondary | ICD-10-CM

## 2020-12-04 DIAGNOSIS — Z8481 Family history of carrier of genetic disease: Secondary | ICD-10-CM

## 2020-12-04 LAB — BRCASSURE COMPREHENSIVE PANEL

## 2020-12-05 ENCOUNTER — Telehealth: Payer: Self-pay | Admitting: Genetic Counselor

## 2020-12-05 NOTE — Telephone Encounter (Signed)
Received a genetic counseling referral from [place] for fhx of breast cancer. Gina Alvarez has been cld and scheduled to see Cari on 7/25 at 11am. Pt aware to arrive 15 minutes early.

## 2020-12-17 ENCOUNTER — Inpatient Hospital Stay: Payer: 59 | Attending: Genetic Counselor | Admitting: Genetic Counselor

## 2020-12-17 ENCOUNTER — Other Ambulatory Visit: Payer: Self-pay

## 2020-12-17 ENCOUNTER — Inpatient Hospital Stay: Payer: 59

## 2020-12-17 DIAGNOSIS — Z8481 Family history of carrier of genetic disease: Secondary | ICD-10-CM | POA: Diagnosis not present

## 2020-12-17 NOTE — Progress Notes (Signed)
GENETIC TEST RESULTS   Patient Name: Gina Alvarez Patient Age: 30 y.o. Encounter Date: 12/17/2020  Referring Provider: Rozell Searing. Nadine Counts, DO    [name] was seen in the Cancer Genetics clinic on December 17, 2020 due to a family history of cancer and concern regarding a hereditary predisposition to cancer in the family due to she and her mother testing positive for a BRCA2 pathogenic mutation. Please refer to the prior Genetics clinic note for more information regarding [name]'s medical and family histories and our assessment at the time.   FAMILY HISTORY:  We obtained a detailed, 4-generation family history.  Significant diagnoses are listed below: Family History  Problem Relation Age of Onset   Breast cancer Mother        dx 78-44; BRCA2+; stage III w/ chemo and radiation   Cancer Mother    Asthma Daughter    Lupus Paternal Grandmother        d. 54   Skin cancer Maternal Grandmother        "skin discoloration on arms" - not spotty   Mental illness Maternal Aunt     [name] has two daughters, age 14 and 3.5 years.  She has one full brother who is currently 30.  She also has one maternal half-sister (60 years old), three paternal half-sisters (78, 8, and 53 years old), and one paternal half-brother who is 5.5 months old.  [name] mother is currently 79.  She was diagnosed with breast cancer at age 14-44 and subsequently had genetic testing which showed that she was positive for a pathogenic BRCA2 gene mutation.  [name] does not have a copy of that report with her today.  Her mother's breast cancer was a stage III and she was treated with chemotherapy and radiation and recently underwent reconstruction.     [name] mother has one maternal half-sister, currently in her 59's, who has not had cancer.  [name] maternal grandmother is currently around 79 years old.  She has a history of "skin cancer" which [name] describes as skin discoloration on her arms, but she  does not describe this as spotty/patchy.  She is not sure if this is sun damage, and she has no further information about it. She reportedly has tested negative for the BRCA2 mutation. [name] maternal grandmother has five sisters who have never had cancer.  [name] has no information for her maternal grandfather.   [name] father has one full sister who is currently 69 and cancer-free.  She has no children.  [name] paternal grandmother died from complications related to lupus at the age of 47.  She had one sister who never had cancer.  [name] paternal grandfather is currently 29 and has not had cancer.  [name] has no further information for her grandfather's family.   No other family members have had genetic testing for the BRCA2 mutation at this time.  Patient's maternal and paternal ancestors are of Caucasian descent. There is no reported Ashkenazi Jewish ancestry. There is no known consanguinity.  GENETIC TESTING: Ms. [name] visit included a copy of her test result that was performed by [facility] and ordered by [employer].  The test analyzed the BRCA1 and BRCA2 genes.  The genetic testing was drawn on Oct 19, 2020 through the [facility] Cancer Panel offered by [facility] identified a single, heterozygous pathogenic gene mutation called BRCA2, [phone]. There were no deleterious mutations in BRCA1.  CLINICAL CONDITION: The average woman's lifetime risk of developing breast cancer is 12%; her risk for developing ovarian cancer is 1.3% (SEER database 2014. https://seer.ShavedPoints.is. Accessed September 2017). Most cases of these cancers are sporadic and are not due to hereditary factors, but approximately 5-10% of breast and ovarian cancer cases are hereditary and due to an identifiable pathogenic variant in a disease-causing gene. Hereditary breast and ovarian cancer syndrome (HBOC) due to pathogenic variants in the BRCA1 and BRCA2 genes accounts  for the majority of hereditary breast and ovarian cancer cases in individuals with a strong family history or an early-onset diagnosis.  HBOC syndrome is characterized by an increased lifetime risk for generally adult-onset cancers including breast, contralateral breast, female breast, ovarian, prostate, and pancreatic (PMID: 29562130).  The cancers associated with BRCA1 are:  Breast cancer, up to a 84% risk  Ovarian cancer, up to a 27% risk  Pancreatic cancer, 2-7%  Prostate cancer, elevated  Melanoma, elevated  INHERITANCE: Hereditary predisposition to cancer due to pathogenic variants in the BRCA2 gene has autosomal dominant inheritance. This means that an individual with a pathogenic variant has a 50% chance of passing the condition on to his/her offspring. Most cases are inherited from a parent, but some cases may occur spontaneously (i.e., an individual with a pathogenic variant has parents who do not have it). Identification of a pathogenic variant allows for the recognition of at-risk relatives who can pursue testing for the familial variant.  Individuals with a single pathogenic BRCA2 variant are also carriers of autosomal recessive Fanconi anemia. Fanconi anemia is characterized by bone marrow failure with variable additional anomalies, which often include short stature, abnormal skin pigmentation, abnormal thumbs, malformations of the skeletal and central nervous systems, and developmental delay (PMID: 8657846, 96295284). Risk of leukemia and early-onset solid tumors is significantly elevated with this disorder (PMID: 13244010, 27253664, 40347425). For there to be a risk of Fanconi anemia in offspring, both the patient and their partner would each have to carry a pathogenic variant in BRCA2; in this case, the risk to have an affected child is 25%.  MANAGEMENT Management Guidelines for individuals with pathogenic BRCA2 variants have been developed by the [employer] (NCCN):  NCCN cancer surveillance:  Females:  Breast awareness starting at age 66 Clinical breast exams every 6-12 months beginning at 30 years of age or at the age of the earliest diagnosed breast cancer in the family, if below age 67 Annual breast MRIs with contrast beginning between the ages of 47 and 3 (or annual mammograms with consideration of tomosynthesis if MRI is unavailable), although the age to initiate screening may be individualized based on family history Annual breast MRI with contrast and annual mammography with consideration of tomosynthesis between the ages of 10 and 62 After age 66, management should be considered on an individual basis. For women treated for breast cancer, screening of remaining breast tissue with annual mammography and breast MRI should continue. Consider risk-reducing mastectomy and counsel regarding degree of protection, degree of cancer risk, and reconstruction options. Address the psychosocial, social, and quality-of-life aspects of undergoing risk-reducing mastectomy. Recommend risk-reducing salpingo-oophorectomy, typically between age 69 and 34 years and upon the completion of childbearing. See specific Risk-Reducing Salpingo-Oophorectomy (RRSO) Protocol in NCCN Guidelines for Ovarian Cancer - Principles of Surgery. Counseling includes a discussion of reproductive desires, extent of cancer risk, degree of protection for breast and ovarian cancer, management of menopausal symptoms, possible short-term hormonal replacement and medical issues. Salpingectomy alone is not  the standard of care for risk reduction, although clinical trials of interval salpingectomy and delayed oophorectomy are ongoing. The concern for risk reducing salpingectomy alone is that women are still at risk for developing ovarian cancer. In addition, in pre-menopausal women, oophorectomy likely reduces the risk of developing breast cancer, but the magnitude is uncertain and may  be gene specific. For those patients who have not elected RRSO, transvaginal ultrasound combined with serum CA-125 for ovarian cancer screening has not been shown to be sufficient sensitive or specific as to support a positive recommendation, but, although of uncertain benefit, it may be considered at the clinician's discretion starting at age 14-35 years. Consider risk-reducing agents as options for breast and ovarian cancer.  Males:  Breast self-exam training and education starting at age 10 years Clinical breast exam every 12 months, beginning at age 28 years Consider prostate-cancer screening beginning at age 55 years.  Pancreatic cancer: NCCN, as well as the [employer] of Gastroenterology Clinical Guidelines cites sufficient evidence to warrant screening for pancreatic cancer (NCCN. Genetic/Familial High-Risk Assessment: Breast and Ovarian. Version 1.2022). Recommendations carriers be limited to those with a first- or second-degree relative affected with pancreatic cancer. Ideally, screening should be performed in experienced centers utilizing a multidisciplinary approach under research conditions. Recommended screening includes annual endoscopic ultrasound and/or MRI of the pancreas starting at age 74 or 4 years younger than the earliest age of pancreatic cancer diagnosis in the family (PMID: 84536468).  Additional risk reduction and management considerations:  Chemoprevention can reduce the risk of breast cancer in the contralateral breast in women with BRCA1 and BRCA2 mutations who have been diagnosed with breast cancer (PMID: 0321224, 82500370). Oral contraceptive use has been shown to reduce the risk of ovarian cancer by approximately 60% in BRCA mutation carriers if taken for at least 5 years (PMID: 4888916). Recent studies have some preliminary data that suggest PARP inhibitors may be a beneficial chemotherapeutic agent for a subset of patients with BRCA-associated breast and  ovarian cancers. Clinical trials are currently in process to determine if and how these agents can be useful in the treatment of BRCA cancer patients (PMID: 94503888).  An individual's cancer risk and medical management are not determined by genetic test results alone. Overall cancer risk assessment incorporates additional factors including personal medical history, family history, as well as available genetic information that may result in a personalized plan for cancer prevention and surveillance.  FAMILY MEMBERS: It is important that all of Ms. Meriweather relatives (both men and women) know of the presence of this gene mutation. Site-specific genetic testing can sort out who in the family is at risk and who is not. It is advantageous to know if a BRCA2 pathogenic variant is present as medical management recommendations can be implemented. At-risk relatives can be identified, allowing pursuit of a diagnostic evaluation. In addition, the available information regarding hereditary cancer susceptibility genes is constantly evolving and more clinically relevant BRCA2 data is likely to become available in the near future. Awareness of this cancer predisposition allows patients and their providers to be vigilant in maintaining close and regular contact with their local genetics clinic in anticipation of new information, inform at-risk family members, and diligently follow condition-specific screening protocols.  Ms. Buehner children are have a 50% chance to have inherited this mutation. However, they are relatively young and this will not be of any consequence to them for several years. We do not test children because there is no risk to them until they  are adults. We recommend they have genetic counseling and testing by the time they are in their early 20's.    Ms. Elden siblings have a 50% chance to have inherited this mutation. We recommend they have genetic testing for this same mutation, as identifying the  presence of this mutation would allow them to also take advantage of risk-reducing measures.   SUPPORT AND RESOURCES:   We have recommended that Ms. Devine be seen by the [facility].  At this clinic they can discuss chemoprevention, mammogram/surgical referrals, and additional information.  This referral was made today.  If Ms. Carra is interested in BRCA-specific information and support, there are two groups, Facing Our Risk (www.facingourrisk.com) and Bright Pink (www.brightpink.org) which some people have found useful. They provide opportunities to speak with other individuals from high-risk families. To locate genetic counselors in other cities, visit the website of the [employer] (ArtistMovie.se) and Secretary/administrator for a Social worker by zip code.  We encouraged Ms. Maves to remain in contact with [place] on an annual basis so we can update her personal and family histories, and let her know of advances in cancer genetics that may benefit the family. Our contact number was provided. Ms. Lehrmann questions were answered to her satisfaction today, and she knows she is welcome to call anytime with additional questions.   Reema Chick P. Florene Glen, [place], [facility] Licensed, Insurance risk surveyor [email or web address] phone: [phone]  The patient was seen for a total of 30 minutes in face-to-face genetic counseling.

## 2020-12-28 ENCOUNTER — Ambulatory Visit: Payer: 59 | Admitting: Family Medicine

## 2021-01-11 ENCOUNTER — Ambulatory Visit (INDEPENDENT_AMBULATORY_CARE_PROVIDER_SITE_OTHER): Payer: Medicaid Other | Admitting: *Deleted

## 2021-01-11 ENCOUNTER — Other Ambulatory Visit: Payer: Self-pay

## 2021-01-11 DIAGNOSIS — Z3042 Encounter for surveillance of injectable contraceptive: Secondary | ICD-10-CM | POA: Diagnosis not present

## 2021-01-18 ENCOUNTER — Other Ambulatory Visit: Payer: Self-pay | Admitting: Family Medicine

## 2021-01-18 DIAGNOSIS — F5104 Psychophysiologic insomnia: Secondary | ICD-10-CM

## 2021-01-30 NOTE — Progress Notes (Signed)
This encounter was created in error - please disregard.

## 2021-03-09 ENCOUNTER — Other Ambulatory Visit: Payer: Self-pay | Admitting: Family Medicine

## 2021-03-09 DIAGNOSIS — F5104 Psychophysiologic insomnia: Secondary | ICD-10-CM

## 2021-03-11 ENCOUNTER — Other Ambulatory Visit: Payer: Self-pay | Admitting: Family Medicine

## 2021-03-11 DIAGNOSIS — F320 Major depressive disorder, single episode, mild: Secondary | ICD-10-CM

## 2021-03-11 NOTE — Telephone Encounter (Signed)
OV 08/10/20 rtc 1 yr

## 2021-04-01 ENCOUNTER — Other Ambulatory Visit: Payer: Self-pay | Admitting: Family Medicine

## 2021-04-01 DIAGNOSIS — Z3042 Encounter for surveillance of injectable contraceptive: Secondary | ICD-10-CM

## 2021-04-03 ENCOUNTER — Ambulatory Visit: Payer: Medicaid Other

## 2021-04-12 ENCOUNTER — Ambulatory Visit: Payer: Medicaid Other

## 2021-05-22 IMAGING — DX DG FINGER RING 2+V*R*
3 series · 3 of 3 positions shown · non-contrast
Comparison: None.

CLINICAL DATA: Status post trauma.

EXAM:
RIGHT RING FINGER 2+V

[finger ap]
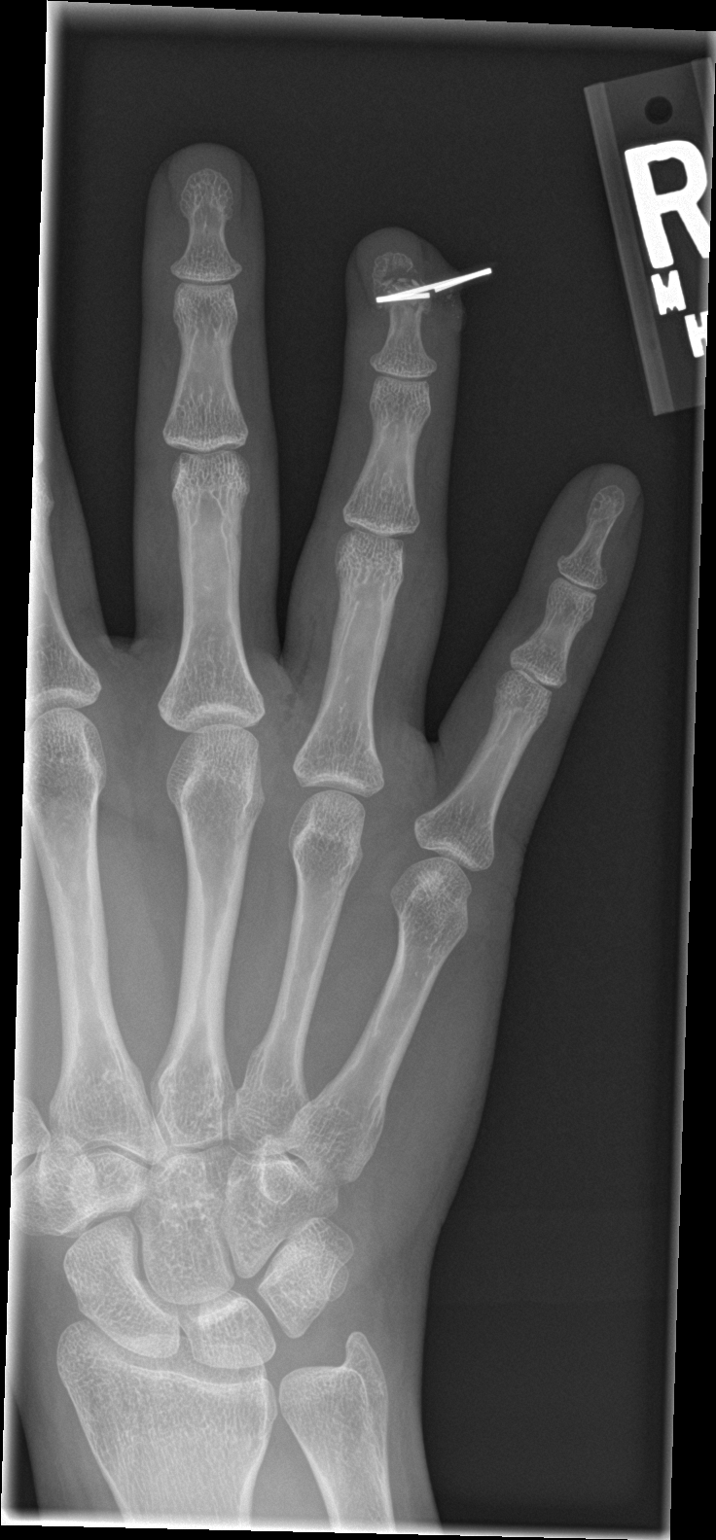

[finger obl]
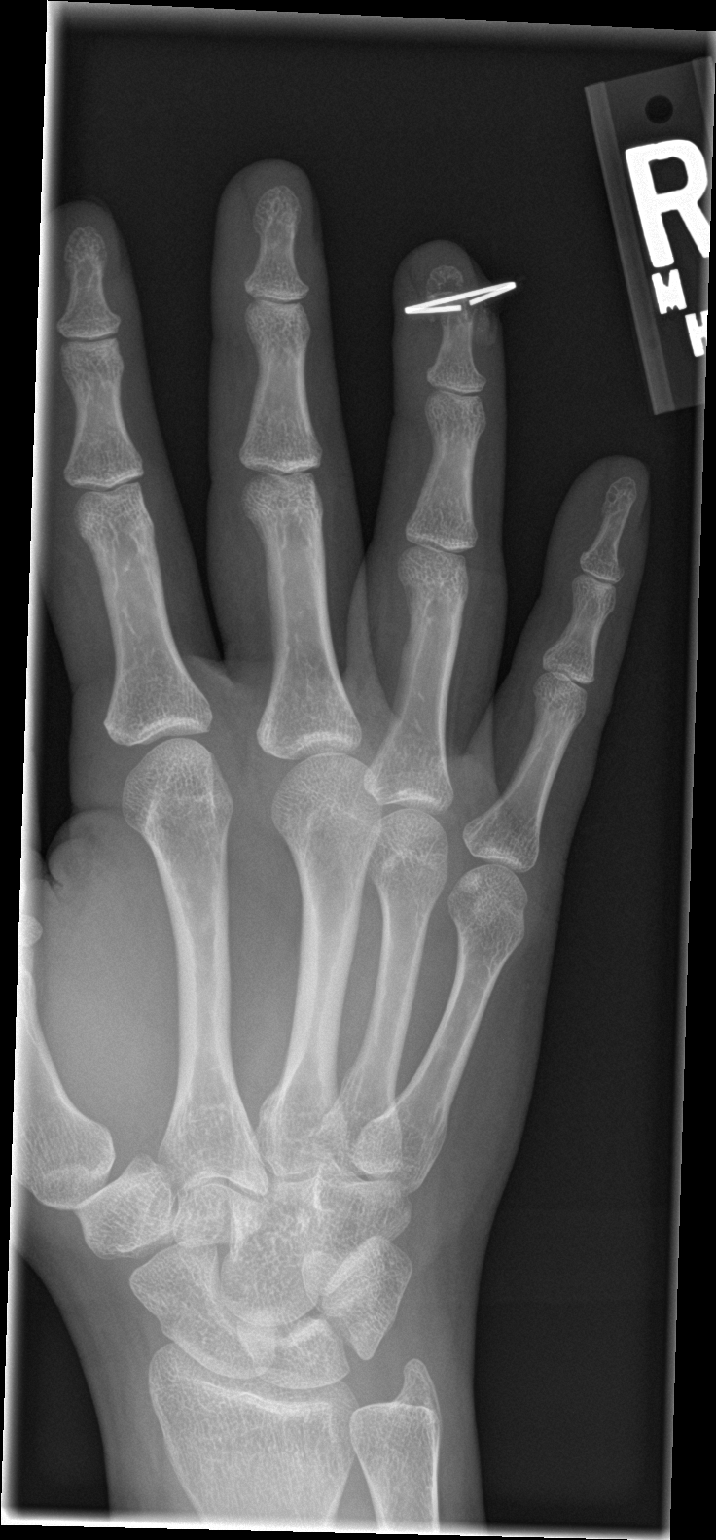

[finger lat]
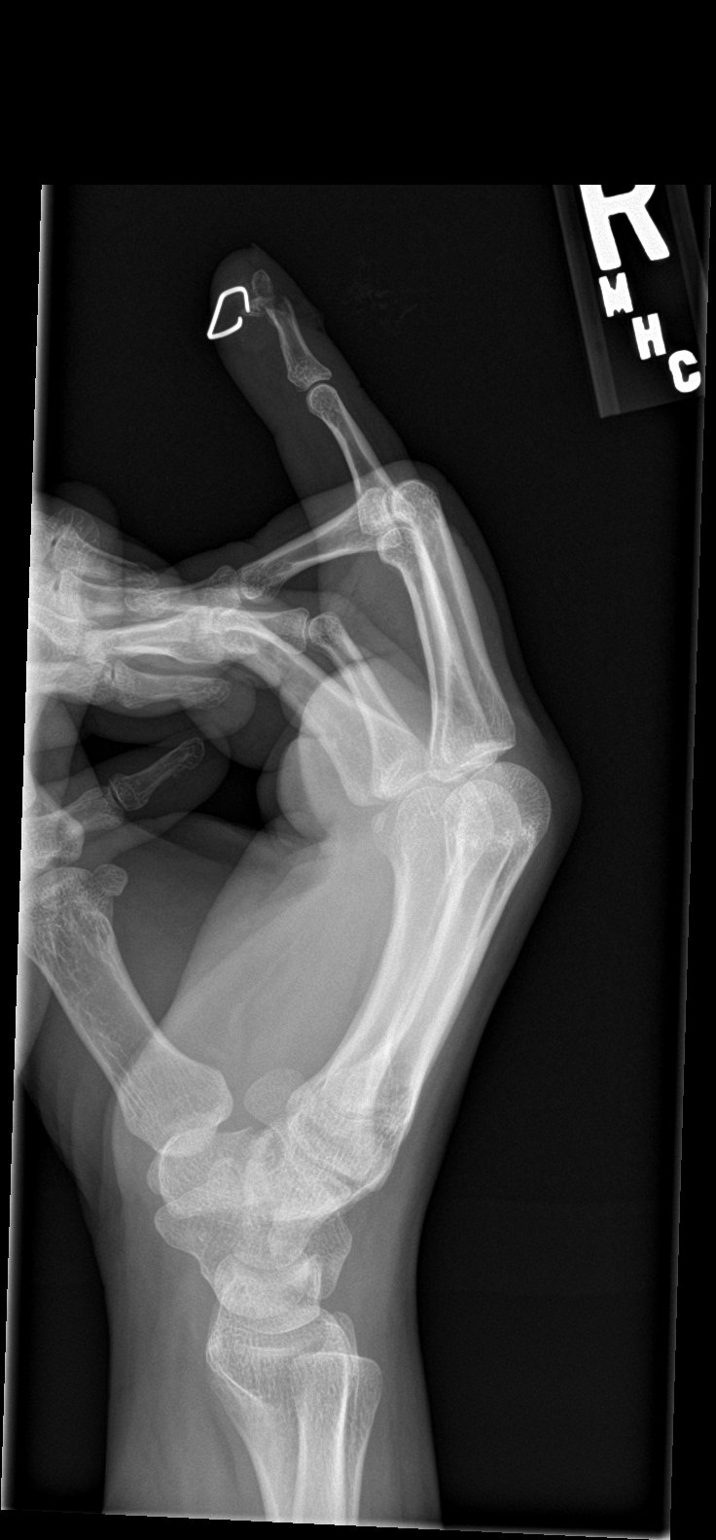

[3 of 3 positions shown; findings below may reference images not displayed]

FINDINGS: A 1.8 cm radiopaque staple is seen within the distal aspect of the
distal phalanx of the fourth right finger. An associated soft tissue
defect and mildly displaced fracture of the tuft is noted. There is
no evidence of fracture or dislocation. There is no evidence of
arthropathy or other focal bone abnormality. Soft tissue swelling is
seen involving the proximal aspect of the fourth right finger. A
mild amount of proximal soft tissue air is suspected.
IMPRESSION: Radiopaque staple within the distal phalanx of the fourth right
finger with an associated fracture deformity.

## 2021-07-02 ENCOUNTER — Encounter: Payer: Self-pay | Admitting: Nurse Practitioner

## 2021-07-02 ENCOUNTER — Ambulatory Visit (INDEPENDENT_AMBULATORY_CARE_PROVIDER_SITE_OTHER): Payer: 59 | Admitting: Nurse Practitioner

## 2021-07-02 ENCOUNTER — Telehealth: Payer: Self-pay | Admitting: Family Medicine

## 2021-07-02 VITALS — HR 83 | Ht 59.0 in | Wt 147.0 lb

## 2021-07-02 DIAGNOSIS — R051 Acute cough: Secondary | ICD-10-CM | POA: Diagnosis not present

## 2021-07-02 DIAGNOSIS — J069 Acute upper respiratory infection, unspecified: Secondary | ICD-10-CM | POA: Diagnosis not present

## 2021-07-02 DIAGNOSIS — J029 Acute pharyngitis, unspecified: Secondary | ICD-10-CM

## 2021-07-02 LAB — RAPID STREP SCREEN (MED CTR MEBANE ONLY): Strep Gp A Ag, IA W/Reflex: NEGATIVE

## 2021-07-02 LAB — CULTURE, GROUP A STREP

## 2021-07-02 MED ORDER — PSEUDOEPH-BROMPHEN-DM 30-2-10 MG/5ML PO SYRP: 5.0000 mL | ORAL_SOLUTION | Freq: Four times a day (QID) | ORAL | 0 refills | Status: DC | PRN

## 2021-07-02 NOTE — Progress Notes (Signed)
Acute Office Visit  Subjective:    Patient ID: Gina Alvarez, female    DOB: 1990/12/03, 31 y.o.   MRN: [id number]  Chief Complaint  Patient presents with   cold/flu symptoms    Cough Congestion Sore throat No headache or fever X3 days    Cough This is a new problem. The current episode started yesterday. The problem has been gradually worsening. The cough is Productive of sputum. Associated symptoms include nasal congestion and a sore throat. Pertinent negatives include no ear congestion, ear pain, fever, headaches, hemoptysis or shortness of breath. Nothing aggravates the symptoms. Risk factors for lung disease include animal exposure. She has tried nothing for the symptoms.  Sore Throat  This is a new problem. The current episode started yesterday. The problem has been unchanged. The pain is worse on the left side. There has been no fever. The pain is moderate. Associated symptoms include coughing. Pertinent negatives include no ear pain, headaches, neck pain, shortness of breath or swollen glands. She has had no exposure to strep. She has tried nothing for the symptoms.    Past Medical History:  Diagnosis Date   Anxiety    Contraception management 08/19/2012   nexplanon inserted 08/19/12 in left arm   Depression    Irregular menstrual bleeding 09/26/2013   Medical history non-contributory    Nexplanon insertion 06/25/2017   06/25/17   Removed 08/25/17 d/t possible infeciton    Past Surgical History:  Procedure Laterality Date   CHOLECYSTECTOMY N/A 09/14/2020   Procedure: LAPAROSCOPIC CHOLECYSTECTOMY;  Surgeon: Virl Cagey, MD;  Location: [facility];  Service: General;  Laterality: N/A;   EXTRACORPOREAL SHOCK WAVE LITHOTRIPSY Right 06/07/2020   Procedure: EXTRACORPOREAL SHOCK WAVE LITHOTRIPSY (ESWL);  Surgeon: Ceasar Mons, MD;  Location: [facility];  Service: Urology;  Laterality: Right;   I & D EXTREMITY Right 02/14/2020   Procedure:  IRRIGATION AND DEBRIDEMENT RIGHT RING FINGER WITH NAILBED REPAIR;  Surgeon: Leanora Cover, MD;  Location: [place];  Service: Orthopedics;  Laterality: Right;   NO PAST SURGERIES     PERCUTANEOUS PINNING Right 02/14/2020   Procedure: PERCUTANEOUS PINNING EXTREMITY;  Surgeon: Leanora Cover, MD;  Location: [place];  Service: Orthopedics;  Laterality: Right;    Family History  Problem Relation Age of Onset   Breast cancer Mother        dx 26-44; BRCA2+; stage III w/ chemo and radiation   Cancer Mother    Asthma Daughter    Lupus Paternal Grandmother        d. 55   Skin cancer Maternal Grandmother        "skin discoloration on arms" - not spotty   Mental illness Maternal Aunt     Social History   Socioeconomic History   Marital status: Single    Spouse name: Not on file   Number of children: Not on file   Years of education: Not on file   Highest education level: Not on file  Occupational History   Not on file  Tobacco Use   Smoking status: Former    Packs/day: 0.50    Years: 8.00    Pack years: 4.00    Types: Cigarettes    Quit date: 12/26/2017    Years since quitting: 3.5   Smokeless tobacco: Never  Vaping Use   Vaping Use: Never used  Substance and Sexual Activity   Alcohol use: Never    Comment: maybe 1 drink per month   Drug  use: Never   Sexual activity: Not Currently    Partners: Male    Birth control/protection: None  Other Topics Concern   Not on file  Social History Narrative   Not on file   Social Determinants of Health   Financial Resource Strain: Not on file  Food Insecurity: Not on file  Transportation Needs: Not on file  Physical Activity: Not on file  Stress: Not on file  Social Connections: Not on file  Intimate Partner Violence: Not on file    Outpatient Medications Prior to Visit  Medication Sig Dispense Refill   medroxyPROGESTERone (DEPO-PROVERA) 150 MG/ML injection INJECT 1 ML INTO THE MUSCLE EVERY 3 MONTHS 1 mL 0   sertraline (ZOLOFT) 100 MG  tablet TAKE ONE TABLET BY MOUTH EVERY DAY 30 tablet 3   traZODone (DESYREL) 50 MG tablet Take 1/2 to 1 tablet (25-50 mg total) by mouth at bedtime as needed for sleep. 30 tablet 3   ondansetron (ZOFRAN ODT) 4 MG disintegrating tablet Take 1 tablet (4 mg total) by mouth every 8 (eight) hours as needed for nausea or vomiting. 20 tablet 0   oxyCODONE (ROXICODONE) 5 MG immediate release tablet Take 1 tablet (5 mg total) by mouth every 4 (four) hours as needed for severe pain or breakthrough pain. 10 tablet 0   No facility-administered medications prior to visit.    No Known Allergies  Review of Systems  Constitutional:  Negative for fever.  HENT:  Positive for sore throat. Negative for ear pain.   Respiratory:  Positive for cough. Negative for hemoptysis and shortness of breath.   Musculoskeletal:  Negative for neck pain.  Neurological:  Negative for headaches.  All other systems reviewed and are negative.     Objective:    Physical Exam Vitals and nursing note reviewed.  Constitutional:      Appearance: Normal appearance.  HENT:     Head: Normocephalic.     Mouth/Throat:     Mouth: Mucous membranes are moist.     Pharynx: Posterior oropharyngeal erythema present. No oropharyngeal exudate.  Eyes:     Conjunctiva/sclera: Conjunctivae normal.  Cardiovascular:     Rate and Rhythm: Normal rate and regular rhythm.     Pulses: Normal pulses.     Heart sounds: Normal heart sounds.  Pulmonary:     Effort: Pulmonary effort is normal.     Breath sounds: Normal breath sounds.  Abdominal:     General: Bowel sounds are normal.  Musculoskeletal:        General: Normal range of motion.  Skin:    General: Skin is warm.     Findings: No rash.  Neurological:     Mental Status: She is alert.  Psychiatric:        Behavior: Behavior normal.    Pulse 83    Ht 4\' 11"  (1.499 m)    Wt 147 lb (66.7 kg)    SpO2 97%    BMI 29.69 kg/m  Wt Readings from Last 3 Encounters:  07/02/21 147 lb (66.7  kg)  09/13/20 141 lb (64 kg)  08/30/20 141 lb (64 kg)    Health Maintenance Due  Topic Date Due   COVID-19 Vaccine (1) Never done   Hepatitis C Screening  Never done    There are no preventive care reminders to display for this patient.   No results found for: TSH Lab Results  Component Value Date   WBC 6.5 09/13/2020   HGB 14.0 09/13/2020  HCT 40.2 09/13/2020   MCV 93.1 09/13/2020   PLT 280 09/13/2020   Lab Results  Component Value Date   NA 137 06/05/2020   K 3.8 06/05/2020   CO2 17 (L) 06/05/2020   GLUCOSE 92 06/05/2020   BUN 6 06/05/2020   CREATININE 0.58 06/05/2020   BILITOT 0.5 04/06/2019   ALKPHOS 57 04/06/2019   AST 15 04/06/2019   ALT 8 04/06/2019   PROT 6.6 04/06/2019   ALBUMIN 4.7 04/06/2019   CALCIUM 9.0 06/05/2020   ANIONGAP 9 06/05/2020   Lab Results  Component Value Date   CHOL 124 04/06/2019   Lab Results  Component Value Date   HDL 37 (L) 04/06/2019   Lab Results  Component Value Date   LDLCALC 74 04/06/2019   Lab Results  Component Value Date   TRIG 57 04/06/2019   Lab Results  Component Value Date   CHOLHDL 3.4 04/06/2019   Lab Results  Component Value Date   HGBA1C 5.1 04/06/2019       Assessment & Plan:  Take meds as prescribed - Use a cool mist humidifier  -Use saline nose sprays frequently -Force fluids -For fever or aches or pains- take Tylenol or ibuprofen. -Strep swab completed and results pending. -Bromfed for cough, congestion, cold and sinus symptoms. -If symptoms do not improve, she may need to be COVID tested to rule this out Follow up with worsening unresolved symptoms  Problem List Items Addressed This Visit   None Visit Diagnoses     Sore throat    -  Primary   Relevant Orders   Rapid Strep Screen ([facility] ONLY)   Upper respiratory tract infection, unspecified type       Relevant Medications   brompheniramine-pseudoephedrine-DM 30-2-10 MG/5ML syrup   Acute cough            Meds  ordered this encounter  Medications   brompheniramine-pseudoephedrine-DM 30-2-10 MG/5ML syrup    Sig: Take 5 mLs by mouth 4 (four) times daily as needed.    Dispense:  120 mL    Refill:  0    Order Specific Question:   Supervising Provider    Answer:   Claretta Fraise [[id number]]     Ivy Lynn, NP

## 2021-07-02 NOTE — Telephone Encounter (Signed)
Please call patients work with strep results. [phone] ext/option 2

## 2021-07-02 NOTE — Patient Instructions (Signed)
Cough, Adult A cough helps to clear your throat and lungs. A cough may be a sign of an illness or another medical condition. An acute cough may only last 2-3 weeks, while a chronic cough may last 8 or more weeks. Many things can cause a cough. They include: Germs (viruses or bacteria) that attack the airway. Breathing in things that bother (irritate) your lungs. Allergies. Asthma. Mucus that runs down the back of your throat (postnasal drip). Smoking. Acid backing up from the stomach into the tube that moves food from the mouth to the stomach (gastroesophageal reflux). Some medicines. Lung problems. Other medical conditions, such as heart failure or a blood clot in the lung (pulmonary embolism). Follow these instructions at home: Medicines Take over-the-counter and prescription medicines only as told by your doctor. Talk with your doctor before you take medicines that stop a cough (cough suppressants). Lifestyle  Do not smoke, and try not to be around smoke. Do not use any products that contain nicotine or tobacco, such as cigarettes, e-cigarettes, and chewing tobacco. If you need help quitting, ask your doctor. Drink enough fluid to keep your pee (urine) pale yellow. Avoid caffeine. Do not drink alcohol if your doctor tells you not to drink. General instructions  Watch for any changes in your cough. Tell your doctor about them. Always cover your mouth when you cough. Stay away from things that make you cough, such as perfume, candles, campfire smoke, or cleaning products. If the air is dry, use a cool mist vaporizer or humidifier in your home. If your cough is worse at night, try using extra pillows to raise your head up higher while you sleep. Rest as needed. Keep all follow-up visits as told by your doctor. This is important. Contact a doctor if: You have new symptoms. You cough up pus. Your cough does not get better after 2-3 weeks, or your cough gets worse. Cough medicine  does not help your cough and you are not sleeping well. You have pain that gets worse or pain that is not helped with medicine. You have a fever. You are losing weight and you do not know why. You have night sweats. Get help right away if: You cough up blood. You have trouble breathing. Your heartbeat is very fast. These symptoms may be an emergency. Do not wait to see if the symptoms will go away. Get medical help right away. Call your local emergency services (911 in the [place] Do not drive yourself to the hospital. Summary A cough helps to clear your throat and lungs. Many things can cause a cough. Take over-the-counter and prescription medicines only as told by your doctor. Always cover your mouth when you cough. Contact a doctor if you have new symptoms or you have a cough that does not get better or gets worse. This information is not intended to replace advice given to you by your health care provider. Make sure you discuss any questions you have with your health care provider. Document Revised: 07/01/2019 Document Reviewed: 05/31/2018 Elsevier Patient Education  [place]. Sore Throat When you have a sore throat, your throat may feel: Tender. Burning. Irritated. Scratchy. Painful when you swallow. Painful when you talk. Many things can cause a sore throat, such as: An infection. Allergies. Dry air. Smoke or pollution. Radiation treatment for cancer. Gastroesophageal reflux disease (GERD). A tumor. A sore throat can be the first sign of another sickness. It can happen with other problems, like: Coughing. Sneezing. Fever. Swelling of  the glands in the neck. Most sore throats go away without treatment. Follow these instructions at home:   Medicines Take over-the-counter and prescription medicines only as told by your doctor. Children often get sore throats. Do not give your child aspirin. Use throat sprays to soothe your throat as told by your health care  provider. Managing pain To help with pain: Sip warm liquids, such as broth, herbal tea, or warm water. Eat or drink cold or frozen liquids, such as frozen ice pops. Rinse your mouth (gargle) with a salt water mixture 3-4 times a day or as needed. To make salt water, dissolve -1 tsp (3-6 g) of salt in 1 cup (237 mL) of warm water. Do not swallow this mixture. Suck on hard candy or throat lozenges. Put a cool-mist humidifier in your bedroom at night. Sit in the bathroom with the door closed for 5-10 minutes while you run hot water in the shower. General instructions Do not smoke or use any products that contain nicotine or tobacco. If you need help quitting, ask your doctor. Get plenty of rest. Drink enough fluid to keep your pee (urine) pale yellow. Wash your hands often for at least 20 seconds with soap and water. If soap and water are not available, use hand sanitizer. Contact a doctor if: You have a fever for more than 2-3 days. You keep having symptoms for more than 2-3 days. Your throat does not get better in 7 days. You have a fever and your symptoms suddenly get worse. Your child who is 3 months to 52 years old has a temperature of 102.30F (39C) or higher. Get help right away if: You have trouble breathing. You cannot swallow fluids, soft foods, or your spit. You have swelling in your throat or neck that gets worse. You feel like you may vomit (nauseous) and this feeling lasts a long time. You cannot stop vomiting. These symptoms may be an emergency. Get help right away. Call your local emergency services (911 in the [place] Do not wait to see if the symptoms will go away. Do not drive yourself to the hospital. Summary A sore throat is a painful, burning, irritated, or scratchy throat. Many things can cause a sore throat. Take over-the-counter medicines only as told by your doctor. Get plenty of rest. Drink enough fluid to keep your pee (urine) pale yellow. Contact a doctor  if your symptoms get worse or your sore throat does not get better within 7 days. This information is not intended to replace advice given to you by your health care provider. Make sure you discuss any questions you have with your health care provider. Document Revised: 08/08/2020 Document Reviewed: 08/08/2020 Elsevier Patient Education  [place].

## 2021-08-14 ENCOUNTER — Ambulatory Visit: Payer: 59 | Admitting: Family Medicine

## 2021-08-14 ENCOUNTER — Encounter: Payer: Self-pay | Admitting: Family Medicine

## 2021-08-19 ENCOUNTER — Other Ambulatory Visit: Payer: Self-pay | Admitting: Family Medicine

## 2021-08-19 DIAGNOSIS — F5104 Psychophysiologic insomnia: Secondary | ICD-10-CM

## 2021-08-19 DIAGNOSIS — F320 Major depressive disorder, single episode, mild: Secondary | ICD-10-CM

## 2021-08-20 NOTE — Telephone Encounter (Signed)
[facility]. NTBS for yrly OV last 08/10/20 ?

## 2021-08-20 NOTE — Telephone Encounter (Signed)
Apt scheduled 09/18/2021 [facility] ?

## 2021-08-20 NOTE — Addendum Note (Signed)
Addended by: Antonietta Barcelona D on: 08/20/2021 05:13 PM ? ? Modules accepted: Orders ? ?

## 2021-08-21 MED ORDER — TRAZODONE HCL 50 MG PO TABS: ORAL_TABLET | ORAL | 0 refills | Status: DC

## 2021-08-21 MED ORDER — SERTRALINE HCL 100 MG PO TABS: 100.0000 mg | ORAL_TABLET | Freq: Every day | ORAL | 0 refills | Status: DC

## 2021-09-13 IMAGING — DX DG ABDOMEN 1V
1 series · 1 of 1 positions shown · non-contrast
Comparison: 06/05/2020.

CLINICAL DATA: Right ureteral stone.

EXAM:
ABDOMEN - 1 VIEW

[abdomen kub]
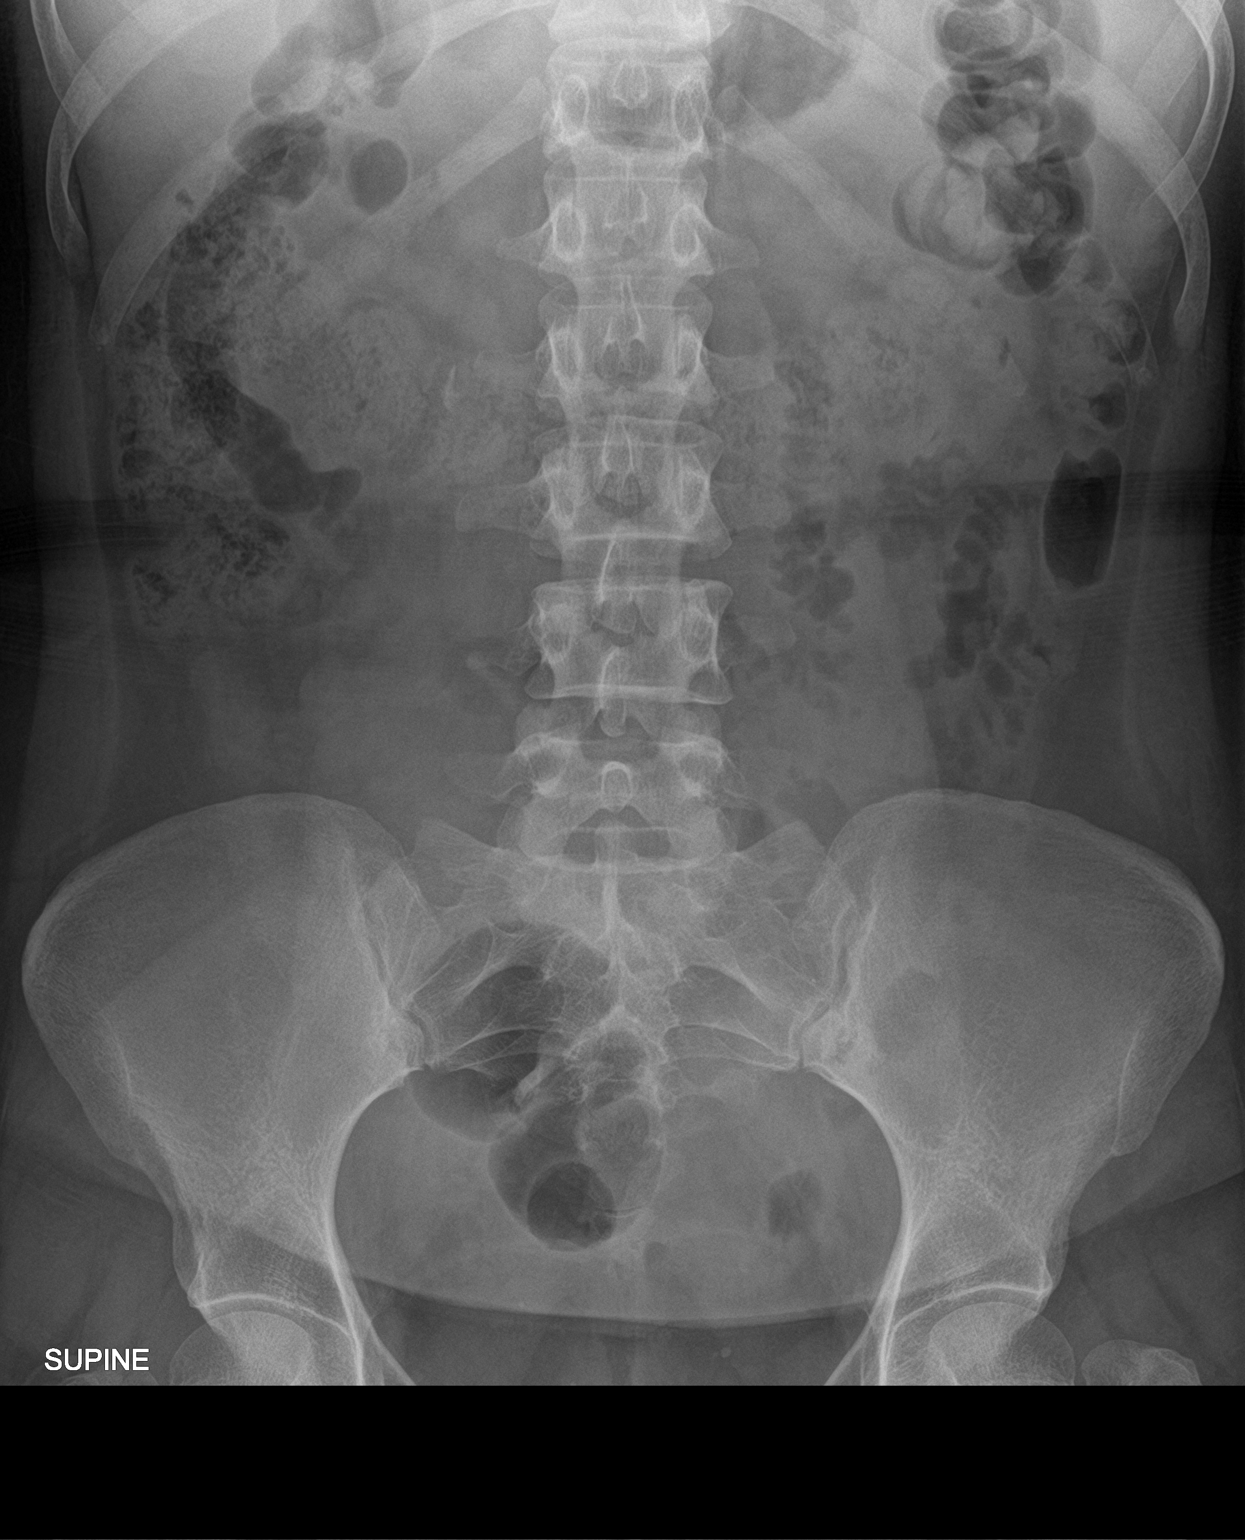

[1 of 1 positions shown; findings below may reference images not displayed]

FINDINGS: 1.1 cm calcific density overlying the expected course of the
proximal right ureter. Pelvic phleboliths. Nonobstructive bowel gas
pattern. No focal osseous abnormality.
IMPRESSION: Proximal right ureteral calculus measuring 1.1 cm.

## 2021-09-18 ENCOUNTER — Ambulatory Visit (INDEPENDENT_AMBULATORY_CARE_PROVIDER_SITE_OTHER): Payer: 59 | Admitting: Family Medicine

## 2021-09-18 ENCOUNTER — Encounter: Payer: Self-pay | Admitting: Family Medicine

## 2021-09-18 VITALS — BP 113/65 | HR 70 | Temp 98.0°F | Ht 59.0 in | Wt 149.8 lb

## 2021-09-18 DIAGNOSIS — E6609 Other obesity due to excess calories: Secondary | ICD-10-CM

## 2021-09-18 DIAGNOSIS — Z3042 Encounter for surveillance of injectable contraceptive: Secondary | ICD-10-CM | POA: Diagnosis not present

## 2021-09-18 DIAGNOSIS — Z1501 Genetic susceptibility to malignant neoplasm of breast: Secondary | ICD-10-CM | POA: Diagnosis not present

## 2021-09-18 DIAGNOSIS — F320 Major depressive disorder, single episode, mild: Secondary | ICD-10-CM

## 2021-09-18 DIAGNOSIS — Z6830 Body mass index (BMI) 30.0-30.9, adult: Secondary | ICD-10-CM

## 2021-09-18 DIAGNOSIS — Z803 Family history of malignant neoplasm of breast: Secondary | ICD-10-CM

## 2021-09-18 DIAGNOSIS — Z1509 Genetic susceptibility to other malignant neoplasm: Secondary | ICD-10-CM

## 2021-09-18 DIAGNOSIS — Z0001 Encounter for general adult medical examination with abnormal findings: Secondary | ICD-10-CM

## 2021-09-18 DIAGNOSIS — J3489 Other specified disorders of nose and nasal sinuses: Secondary | ICD-10-CM

## 2021-09-18 DIAGNOSIS — F5104 Psychophysiologic insomnia: Secondary | ICD-10-CM | POA: Diagnosis not present

## 2021-09-18 DIAGNOSIS — Z0000 Encounter for general adult medical examination without abnormal findings: Secondary | ICD-10-CM

## 2021-09-18 DIAGNOSIS — Z1502 Genetic susceptibility to malignant neoplasm of ovary: Secondary | ICD-10-CM

## 2021-09-18 DIAGNOSIS — Z8481 Family history of carrier of genetic disease: Secondary | ICD-10-CM

## 2021-09-18 LAB — PREGNANCY, URINE: Preg Test, Ur: NEGATIVE

## 2021-09-18 MED ORDER — MEDROXYPROGESTERONE ACETATE 150 MG/ML IM SUSP: INTRAMUSCULAR | 0 refills | Status: DC

## 2021-09-18 MED ORDER — SERTRALINE HCL 100 MG PO TABS: 150.0000 mg | ORAL_TABLET | Freq: Every day | ORAL | 3 refills | Status: DC

## 2021-09-18 MED ORDER — BUPROPION HCL ER (SR) 100 MG PO TB12: 100.0000 mg | ORAL_TABLET | Freq: Every day | ORAL | 0 refills | Status: DC

## 2021-09-18 MED ORDER — ZOLPIDEM TARTRATE 5 MG PO TABS: 2.5000 mg | ORAL_TABLET | Freq: Every evening | ORAL | 5 refills | Status: DC | PRN

## 2021-09-18 MED ORDER — LEVOCETIRIZINE DIHYDROCHLORIDE 5 MG PO TABS: 5.0000 mg | ORAL_TABLET | Freq: Every evening | ORAL | 3 refills | Status: DC

## 2021-09-18 MED ORDER — AZELASTINE HCL 0.1 % NA SOLN: 1.0000 | Freq: Two times a day (BID) | NASAL | 12 refills | Status: DC

## 2021-09-18 NOTE — Patient Instructions (Signed)
Preventive Care 31-31 Years Old, Female ?Preventive care refers to lifestyle choices and visits with your health care provider that can promote health and wellness. Preventive care visits are also called wellness exams. ?What can I expect for my preventive care visit? ?Counseling ?During your preventive care visit, your health care provider may ask about your: ?Medical history, including: ?Past medical problems. ?Family medical history. ?Pregnancy history. ?Current health, including: ?Menstrual cycle. ?Method of birth control. ?Emotional well-being. ?Home life and relationship well-being. ?Sexual activity and sexual health. ?Lifestyle, including: ?Alcohol, nicotine or tobacco, and drug use. ?Access to firearms. ?Diet, exercise, and sleep habits. ?Work and work Statistician. ?Sunscreen use. ?Safety issues such as seatbelt and bike helmet use. ?Physical exam ?Your health care provider may check your: ?Height and weight. These may be used to calculate your BMI (body mass index). BMI is a measurement that tells if you are at a healthy weight. ?Waist circumference. This measures the distance around your waistline. This measurement also tells if you are at a healthy weight and may help predict your risk of certain diseases, such as type 2 diabetes and high blood pressure. ?Heart rate and blood pressure. ?Body temperature. ?Skin for abnormal spots. ?What immunizations do I need? ? ?Vaccines are usually given at various ages, according to a schedule. Your health care provider will recommend vaccines for you based on your age, medical history, and lifestyle or other factors, such as travel or where you work. ?What tests do I need? ?Screening ?Your health care provider may recommend screening tests for certain conditions. This may include: ?Pelvic exam and Pap test. ?Lipid and cholesterol levels. ?Diabetes screening. This is done by checking your blood sugar (glucose) after you have not eaten for a while (fasting). ?Hepatitis  B test. ?Hepatitis C test. ?HIV (human immunodeficiency virus) test. ?STI (sexually transmitted infection) testing, if you are at risk. ?BRCA-related cancer screening. This may be done if you have a family history of breast, ovarian, tubal, or peritoneal cancers. ?Talk with your health care provider about your test results, treatment options, and if necessary, the need for more tests. ?Follow these instructions at home: ?Eating and drinking ? ?Eat a healthy diet that includes fresh fruits and vegetables, whole grains, lean protein, and low-fat dairy products. ?Take vitamin and mineral supplements as recommended by your health care provider. ?Do not drink alcohol if: ?Your health care provider tells you not to drink. ?You are pregnant, may be pregnant, or are planning to become pregnant. ?If you drink alcohol: ?Limit how much you have to 0-1 drink a day. ?Know how much alcohol is in your drink. In the [place] one drink equals one 12 oz bottle of beer (355 mL), one 5 oz glass of wine (148 mL), or one 1? oz glass of hard liquor (44 mL). ?Lifestyle ?Brush your teeth every morning and night with fluoride toothpaste. Floss one time each day. ?Exercise for at least 30 minutes 5 or more days each week. ?Do not use any products that contain nicotine or tobacco. These products include cigarettes, chewing tobacco, and vaping devices, such as e-cigarettes. If you need help quitting, ask your health care provider. ?Do not use drugs. ?If you are sexually active, practice safe sex. Use a condom or other form of protection to prevent STIs. ?If you do not wish to become pregnant, use a form of birth control. If you plan to become pregnant, see your health care provider for a prepregnancy visit. ?Find healthy ways to manage stress, such as: ?Meditation,  yoga, or listening to music. ?Journaling. ?Talking to a trusted person. ?Spending time with friends and family. ?Minimize exposure to UV radiation to reduce your risk of skin  cancer. ?Safety ?Always wear your seat belt while driving or riding in a vehicle. ?Do not drive: ?If you have been drinking alcohol. Do not ride with someone who has been drinking. ?If you have been using any mind-altering substances or drugs. ?While texting. ?When you are tired or distracted. ?Wear a helmet and other protective equipment during sports activities. ?If you have firearms in your house, make sure you follow all gun safety procedures. ?Seek help if you have been physically or sexually abused. ?What's next? ?Go to your health care provider once a year for an annual wellness visit. ?Ask your health care provider how often you should have your eyes and teeth checked. ?Stay up to date on all vaccines. ?This information is not intended to replace advice given to you by your health care provider. Make sure you discuss any questions you have with your health care provider. ?Document Revised: 11/07/2020 Document Reviewed: 11/07/2020 ?Elsevier Patient Education ? [place]. ? ?

## 2021-09-18 NOTE — Progress Notes (Signed)
? ?Gina Alvarez is a 31 y.o. female presents to office today for annual physical exam examination.   ? ?Concerns today include: ?1.  Allergic rhinitis ?Patient reports that sometimes she will have some astringent she gets sores in her nose.  She definitely gets postnasal drip.  Has used OTC 4-hour allergy relief with various results.  She uses nasal saline spray but again still has these issues. ? ?2.  Depression and anxiety/insomnia ?Patient reports some exacerbation in her stress.  She had a relative living with her and this seems to be causing increased OCD because her relative is messy and she is not.  She is compliant with her Zoloft 100 mg daily.  She uses trazodone with good sleep but reports hangover when she utilizes that medication.  Would like to make some adjustments if possible ? ?3.  Obesity ?Patient reports difficulty with losing weight.  She admits that she has difficulty finding time for exercise due to her being a single mother.  She works quite a bit as well.  Would be interested in something to help her but does not want to go on any type of injection therapy ? ?Occupation: Education administrator at [employer], Marital status: Single, Substance use: None ?Diet: Fair and typical American, Exercise: No structured ?Last pap smear: Up-to-date ?Refills needed today: See above ?Immunizations needed: ?Immunization History  ?Administered Date(s) Administered  ? DTP 01/17/1991  ? DTaP 01/17/1991  ? Hepatitis B 03/21/1991, 06/06/1991  ? HiB (PRP-OMP) 01/17/1991  ? IPV 01/17/1991  ? Influenza,inj,Quad PF,6+ Mos 03/11/2017, 03/15/2019, 03/22/2021  ? Influenza-Unspecified 03/13/2018  ? OPV 01/17/1991  ? Td 01/23/2011  ? Tdap 01/23/2011, 08/25/2015, 05/17/2017  ? ? ? ?Past Medical History:  ?Diagnosis Date  ? Anxiety   ? Contraception management 08/19/2012  ? nexplanon inserted 08/19/12 in left arm  ? Depression   ? Irregular menstrual bleeding 09/26/2013  ? Medical history non-contributory   ? Nexplanon  insertion 06/25/2017  ? 06/25/17   Removed 08/25/17 d/t possible infeciton  ? ?Social History  ? ?Socioeconomic History  ? Marital status: Single  ?  Spouse name: Not on file  ? Number of children: Not on file  ? Years of education: Not on file  ? Highest education level: Not on file  ?Occupational History  ? Not on file  ?Tobacco Use  ? Smoking status: Former  ?  Packs/day: 0.50  ?  Years: 8.00  ?  Pack years: 4.00  ?  Types: Cigarettes  ?  Quit date: 12/26/2017  ?  Years since quitting: 3.7  ? Smokeless tobacco: Never  ?Vaping Use  ? Vaping Use: Never used  ?Substance and Sexual Activity  ? Alcohol use: Never  ?  Comment: maybe 1 drink per month  ? Drug use: Never  ? Sexual activity: Not Currently  ?  Partners: Male  ?  Birth control/protection: None  ?Other Topics Concern  ? Not on file  ?Social History Narrative  ? Not on file  ? ?Social Determinants of Health  ? ?Financial Resource Strain: Not on file  ?Food Insecurity: Not on file  ?Transportation Needs: Not on file  ?Physical Activity: Not on file  ?Stress: Not on file  ?Social Connections: Not on file  ?Intimate Partner Violence: Not on file  ? ?Past Surgical History:  ?Procedure Laterality Date  ? CHOLECYSTECTOMY N/A 09/14/2020  ? Procedure: LAPAROSCOPIC CHOLECYSTECTOMY;  Surgeon: Virl Cagey, MD;  Location: [facility];  Service: General;  Laterality: N/A;  ?  EXTRACORPOREAL SHOCK WAVE LITHOTRIPSY Right 06/07/2020  ? Procedure: EXTRACORPOREAL SHOCK WAVE LITHOTRIPSY (ESWL);  Surgeon: Ceasar Mons, MD;  Location: [facility];  Service: Urology;  Laterality: Right;  ? I & D EXTREMITY Right 02/14/2020  ? Procedure: IRRIGATION AND DEBRIDEMENT RIGHT RING FINGER WITH NAILBED REPAIR;  Surgeon: Leanora Cover, MD;  Location: [place];  Service: Orthopedics;  Laterality: Right;  ? NO PAST SURGERIES    ? PERCUTANEOUS PINNING Right 02/14/2020  ? Procedure: PERCUTANEOUS PINNING EXTREMITY;  Surgeon: Leanora Cover, MD;  Location: [place];  Service:  Orthopedics;  Laterality: Right;  ? ?Family History  ?Problem Relation Age of Onset  ? Breast cancer Mother   ?     dx 12-44; BRCA2+; stage III w/ chemo and radiation  ? Cancer Mother   ? Asthma Daughter   ? Lupus Paternal Grandmother   ?     d. 58  ? Skin cancer Maternal Grandmother   ?     "skin discoloration on arms" - not spotty  ? Mental illness Maternal Aunt   ? ? ?Current Outpatient Medications:  ?  medroxyPROGESTERone (DEPO-PROVERA) 150 MG/ML injection, INJECT 1 ML INTO THE MUSCLE EVERY 3 MONTHS, Disp: 1 mL, Rfl: 0 ?  sertraline (ZOLOFT) 100 MG tablet, Take 1 tablet (100 mg total) by mouth daily., Disp: 30 tablet, Rfl: 0 ?  traZODone (DESYREL) 50 MG tablet, Take 1/2 to 1 tablet (25-50 mg total) by mouth at bedtime as needed for sleep., Disp: 30 tablet, Rfl: 0 ? ?No Known Allergies  ? ?ROS: ?Review of Systems ?Pertinent items noted in HPI and remainder of comprehensive ROS otherwise negative.   ? ?Physical exam ?BP 113/65   Pulse 70   Temp 98 ?F (36.7 ?C)   Ht 4\' 11"  (1.499 m)   Wt 149 lb 12.8 oz (67.9 kg)   SpO2 99%   BMI 30.26 kg/m?  ?General appearance: alert, cooperative, appears stated age, no distress, and mildly obese ?Head: Normocephalic, without obvious abnormality, atraumatic ?Eyes: negative findings: lids and lashes normal, conjunctivae and sclerae normal, corneas clear, and pupils equal, round, reactive to light and accomodation ?Ears: normal TM's and external ear canals both ears ?Nose: Nares normal. Septum midline. Mucosa normal.  Mild clear drainage  ?Throat: lips, mucosa, and tongue normal; teeth and gums normal ?Neck: no adenopathy, supple, symmetrical, trachea midline, and thyroid not enlarged, symmetric, no tenderness/mass/nodules ?Back: symmetric, no curvature. ROM normal. No CVA tenderness. ?Lungs: clear to auscultation bilaterally ?Heart: regular rate and rhythm, S1, S2 normal, no murmur, click, rub or gallop ?Abdomen: soft, non-tender; bowel sounds normal; no masses,  no  organomegaly ?Extremities: extremities normal, atraumatic, no cyanosis or edema ?Pulses: 2+ and symmetric ?Skin: Skin color, texture, turgor normal. No rashes or lesions ?Lymph nodes: Cervical, supraclavicular, and axillary nodes normal. ?Neurologic: Grossly normal ?Psych: Mood stable, speech normal, affect appropriate.  Patient very pleasant and interactive ? ? ?  09/18/2021  ?  8:32 AM 07/02/2021  ?  9:43 AM 08/10/2020  ?  1:58 PM  ?Depression screen PHQ 2/9  ?Decreased Interest 1 2 1   ?Down, Depressed, Hopeless 1 2 1   ?PHQ - 2 Score 2 4 2   ?Altered sleeping 2 0 1  ?Tired, decreased energy 3 3 2   ?Change in appetite 0 0 0  ?Feeling bad or failure about yourself  0 2 1  ?Trouble concentrating 2 2 1   ?Moving slowly or fidgety/restless 1 0 0  ?Suicidal thoughts 0 0 0  ?PHQ-9 Score  $'10 11 7  'q$ ?Difficult doing work/chores Somewhat difficult Very difficult Somewhat difficult  ? ? ?  09/18/2021  ?  8:32 AM 07/02/2021  ?  9:44 AM  ?GAD 7 : Generalized Anxiety Score  ?Nervous, Anxious, on Edge 3 1  ?Control/stop worrying 3 2  ?Worry too much - different things 3 2  ?Trouble relaxing 3 2  ?Restless 3 0  ?Easily annoyed or irritable 3 3  ?Afraid - awful might happen 0 0  ?Total GAD 7 Score 18 10  ?Anxiety Difficulty Somewhat difficult Very difficult  ? ?Assessment/ Plan: ?Gina Alvarez here for annual physical exam.  ? ?Annual physical exam ? ?Psychophysiological insomnia - Plan: zolpidem (AMBIEN) 5 MG tablet, ToxASSURE Select 13 (MW), Urine ? ?Depression, major, single episode, mild (HCC) - Plan: sertraline (ZOLOFT) 100 MG tablet, buPROPion ER (WELLBUTRIN SR) 100 MG 12 hr tablet ? ?Class 1 obesity due to excess calories without serious comorbidity with body mass index (BMI) of 30.0 to 30.9 in adult - Plan: CMP14+EGFR, Lipid panel, TSH, CBC, Amb ref to Medical Nutrition Therapy-MNT ? ?BRCA2 gene mutation positive in female - Plan: Ambulatory referral to Plastic Surgery ? ?Family history of breast cancer in mother - Plan: Ambulatory  referral to Plastic Surgery ? ?FHx: BRCA gene positive - Plan: Ambulatory referral to Plastic Surgery ? ?Encounter for surveillance of injectable contraceptive - Plan: medroxyPROGESTERone (DEPO-PROVERA) 150 MG/ML injectio

## 2021-09-24 LAB — TOXASSURE SELECT 13 (MW), URINE

## 2021-10-11 ENCOUNTER — Ambulatory Visit: Payer: 59 | Admitting: Plastic Surgery

## 2021-10-11 ENCOUNTER — Encounter: Payer: Self-pay | Admitting: Plastic Surgery

## 2021-10-11 VITALS — BP 106/73 | HR 90 | Ht 59.0 in | Wt 150.8 lb

## 2021-10-11 DIAGNOSIS — N6459 Other signs and symptoms in breast: Secondary | ICD-10-CM | POA: Diagnosis not present

## 2021-10-11 DIAGNOSIS — Z8481 Family history of carrier of genetic disease: Secondary | ICD-10-CM

## 2021-10-11 DIAGNOSIS — Z1501 Genetic susceptibility to malignant neoplasm of breast: Secondary | ICD-10-CM | POA: Diagnosis not present

## 2021-10-11 NOTE — Progress Notes (Signed)
Patient ID: Gina Alvarez, female    DOB: Jun 22, 1990, 31 y.o.   MRN: [id number]   Chief Complaint  Patient presents with   consult        Breast Problem    The patient is a 31 year old female here for evaluation of her breast for consultation for reconstruction.  She is here with her mom who underwent bilateral mastectomies about 5 years ago.  She had a Diep flap at [facility].  She is very pleased with her reconstruction although had several complications throughout the process.  The patient does not want to do autologous reconstruction in any way.  She is 4 feet 11 inches tall and weighs 150 pounds.  Her preoperative bra size is a D.  She would like to be around the fold be to see after reconstruction.  She does not have breast cancer.  But she does test positive for BRCA.  She has not seen a general surgeon yet.  She has 2 kids at home.  She has undergone a cholecystectomy is being treated for depression.   Review of Systems  Constitutional: Negative.   Eyes: Negative.   Respiratory: Negative.  Negative for chest tightness and shortness of breath.   Cardiovascular: Negative.  Negative for leg swelling.  Gastrointestinal: Negative.   Endocrine: Negative.   Genitourinary: Negative.   Musculoskeletal: Negative.   Skin: Negative.  Negative for wound.  Psychiatric/Behavioral: Negative.     Past Medical History:  Diagnosis Date   Anxiety    Contraception management 08/19/2012   nexplanon inserted 08/19/12 in left arm   Depression    Irregular menstrual bleeding 09/26/2013   Medical history non-contributory    Nexplanon insertion 06/25/2017   06/25/17   Removed 08/25/17 d/t possible infeciton    Past Surgical History:  Procedure Laterality Date   CHOLECYSTECTOMY N/A 09/14/2020   Procedure: LAPAROSCOPIC CHOLECYSTECTOMY;  Surgeon: Virl Cagey, MD;  Location: [facility];  Service: General;  Laterality: N/A;   EXTRACORPOREAL SHOCK WAVE LITHOTRIPSY Right 06/07/2020   Procedure:  EXTRACORPOREAL SHOCK WAVE LITHOTRIPSY (ESWL);  Surgeon: Ceasar Mons, MD;  Location: [facility];  Service: Urology;  Laterality: Right;   I & D EXTREMITY Right 02/14/2020   Procedure: IRRIGATION AND DEBRIDEMENT RIGHT RING FINGER WITH NAILBED REPAIR;  Surgeon: Leanora Cover, MD;  Location: [place];  Service: Orthopedics;  Laterality: Right;   NO PAST SURGERIES     PERCUTANEOUS PINNING Right 02/14/2020   Procedure: PERCUTANEOUS PINNING EXTREMITY;  Surgeon: Leanora Cover, MD;  Location: [place];  Service: Orthopedics;  Laterality: Right;      Current Outpatient Medications:    azelastine (ASTELIN) 0.1 % nasal spray, Place 1 spray into both nostrils 2 (two) times daily., Disp: 30 mL, Rfl: 12   buPROPion ER (WELLBUTRIN SR) 100 MG 12 hr tablet, Take 1 tablet (100 mg total) by mouth daily., Disp: 90 tablet, Rfl: 0   levocetirizine (XYZAL) 5 MG tablet, Take 1 tablet (5 mg total) by mouth every evening., Disp: 90 tablet, Rfl: 3   medroxyPROGESTERone (DEPO-PROVERA) 150 MG/ML injection, INJECT 1 ML INTO THE MUSCLE EVERY 3 MONTHS, Disp: 1 mL, Rfl: 0   sertraline (ZOLOFT) 100 MG tablet, Take 1.5 tablets (150 mg total) by mouth daily., Disp: 135 tablet, Rfl: 3   zolpidem (AMBIEN) 5 MG tablet, Take 0.5-1 tablets (2.5-5 mg total) by mouth at bedtime as needed for sleep., Disp: 15 tablet, Rfl: 5   Objective:   Vitals:  10/11/21 1506  BP: 106/73  Pulse: 90  SpO2: 99%    Physical Exam Vitals and nursing note reviewed.  Constitutional:      Appearance: Normal appearance.  HENT:     Head: Normocephalic and atraumatic.  Cardiovascular:     Rate and Rhythm: Normal rate.     Pulses: Normal pulses.  Pulmonary:     Effort: Pulmonary effort is normal.  Abdominal:     General: There is no distension.     Palpations: Abdomen is soft.     Tenderness: There is no abdominal tenderness.  Musculoskeletal:        General: No swelling or deformity.  Skin:    General: Skin is warm.      Capillary Refill: Capillary refill takes less than 2 seconds.     Coloration: Skin is not jaundiced.     Findings: No bruising.  Neurological:     Mental Status: She is alert and oriented to person, place, and time.  Psychiatric:        Mood and Affect: Mood normal.        Behavior: Behavior normal.        Thought Content: Thought content normal.    Assessment & Plan:  FHx: BRCA gene positive  The options for reconstruction we explained to the patient / family for breast reconstruction.  There are two general categories of reconstruction.  We can reconstruction a breast with implants or use the patient's own tissue.  These were further discussed as listed.  Breast reconstruction is an optional procedure and eligibility depends on the full spectrum of the health of the patient and any co-morbidities.  More than one surgery is often needed to complete the reconstruction process.  The process can take three to twelve months to complete.  The breasts will not be identical due to many factors such as rib differences, shoulder asymmetry and treatments such as radiation.  The goal is to get the breasts to look normal and symmetrical in clothes.  Scars are a part of surgery and may fade some in time but will always be present under clothes.  Surgery may be an option on the non-cancer breast to achieve more symmetry.  No matter which procedure is chosen there is always the risk of complications and even failure of the body to heal.  This could result in no breast.    The options for reconstruction include:  1. Placement of a tissue expander with Acellular dermal matrix. When the expander is the desired size surgery is performed to remove the expander and place an implant.  In some cases the implant can be placed without an expander.  2. Autologous reconstruction can include using a muscle or tissue from another area of the body to create a breast.  3. Combined procedures (ie. latissismus dorsi flap) can  be done with an expander / implant placed under the muscle.   The risks, benefits, scars and recovery time were discussed for each of the above. Risks include bleeding, infection, hematoma, seroma, scarring, pain, wound healing complications, flap loss, fat necrosis, capsular contracture, need for implant removal, donor site complications, bulge, hernia, umbilical necrosis, need for urgent reoperation, and need for dressing changes.   The procedure the patient selected / that was best for the patient, was then discussed in further detail.  Total time: 45 minutes. This includes time spent with the patient during the visit as well as time spent before and after the visit reviewing the chart,  documenting the encounter, making phone calls and reviewing studies.   The patient is interested in immediate reconstruction after bilateral mastectomies with expanders and Flex HD.  We will send her to see general surgery and then I will plan on doing a televisit with her in about a month to see if she has any additional questions.  Pictures were obtained of the patient and placed in the chart with the patient's or guardian's permission.   [place], DO

## 2021-10-14 ENCOUNTER — Other Ambulatory Visit: Payer: Self-pay

## 2021-10-14 ENCOUNTER — Telehealth: Payer: Self-pay

## 2021-10-14 DIAGNOSIS — Z8481 Family history of carrier of genetic disease: Secondary | ICD-10-CM

## 2021-10-14 NOTE — Telephone Encounter (Signed)
Faxed referral to Dr. Marlou Starks and [facility] for BL mastectomies

## 2021-10-18 ENCOUNTER — Ambulatory Visit (INDEPENDENT_AMBULATORY_CARE_PROVIDER_SITE_OTHER): Payer: 59 | Admitting: Family Medicine

## 2021-10-18 ENCOUNTER — Other Ambulatory Visit (HOSPITAL_COMMUNITY)
Admission: RE | Admit: 2021-10-18 | Discharge: 2021-10-18 | Disposition: A | Discharge status: Home / Self Care | Payer: 59 | Source: Ambulatory Visit | Attending: Family Medicine | Admitting: Family Medicine

## 2021-10-18 ENCOUNTER — Encounter: Payer: Self-pay | Admitting: Family Medicine

## 2021-10-18 VITALS — BP 101/70 | HR 104 | Temp 97.0°F | Ht 59.0 in | Wt 153.0 lb

## 2021-10-18 DIAGNOSIS — Z975 Presence of (intrauterine) contraceptive device: Secondary | ICD-10-CM | POA: Diagnosis not present

## 2021-10-18 DIAGNOSIS — A749 Chlamydial infection, unspecified: Secondary | ICD-10-CM

## 2021-10-18 DIAGNOSIS — Z30430 Encounter for insertion of intrauterine contraceptive device: Secondary | ICD-10-CM

## 2021-10-18 DIAGNOSIS — Z01419 Encounter for gynecological examination (general) (routine) without abnormal findings: Secondary | ICD-10-CM | POA: Diagnosis not present

## 2021-10-18 MED ORDER — PARAGARD INTRAUTERINE COPPER IU IUD
1.0000 | INTRAUTERINE_SYSTEM | Freq: Once | INTRAUTERINE | Status: AC
  Administered 2021-10-18: 1 via INTRAUTERINE

## 2021-10-18 NOTE — Progress Notes (Signed)
Patient is coming in today for IUD insertion she wants a copper  IUD insertion procedure: Patient was placed in stirrups and use a speculum. Patient was prepped with Betadine swabs using cotton balls. Tenaculum was used to grab the anterior cervix. Uterine sound was performed and found to be 6.5cm in length and anteroverted. Cervical dilation was not needed. Paragard was placed using factory device at the correct depth that was measured and deployed without issue. The strings were cut leaving 1.5 cm extra. Patient tolerated procedure well and bleeding was minimal.   Vaginal exam shows an expanded transitional zone, performed Pap smear  Problem List Items Addressed This Visit   None Visit Diagnoses     Contraception, device intrauterine    -  Primary   Relevant Medications   paragard intrauterine copper IUD 1 each (Completed)   Other Relevant Orders   Pregnancy, urine   Cytology - PAP([place])   Papanicolaou test, as part of routine gynecological examination       Relevant Orders   Cytology - PAP([place])   Pap IG and Chlamydia/Gonococcus, NAA ([facility])

## 2021-10-22 LAB — PREGNANCY, URINE: Preg Test, Ur: NEGATIVE

## 2021-10-24 ENCOUNTER — Ambulatory Visit (INDEPENDENT_AMBULATORY_CARE_PROVIDER_SITE_OTHER): Payer: 59 | Admitting: Family Medicine

## 2021-10-24 ENCOUNTER — Encounter: Payer: Self-pay | Admitting: Family Medicine

## 2021-10-24 ENCOUNTER — Telehealth: Payer: Self-pay | Admitting: Family Medicine

## 2021-10-24 DIAGNOSIS — A749 Chlamydial infection, unspecified: Secondary | ICD-10-CM | POA: Diagnosis not present

## 2021-10-24 LAB — CYTOLOGY - PAP
Chlamydia: POSITIVE — AB
Comment: NEGATIVE
Comment: NORMAL
Diagnosis: NEGATIVE
Neisseria Gonorrhea: NEGATIVE

## 2021-10-24 MED ORDER — CEFTRIAXONE SODIUM 1 G IJ SOLR
1.0000 g | Freq: Once | INTRAMUSCULAR | Status: AC
  Administered 2021-10-24: 1 g via INTRAMUSCULAR

## 2021-10-24 MED ORDER — DOXYCYCLINE HYCLATE 100 MG PO TABS
100.0000 mg | ORAL_TABLET | Freq: Two times a day (BID) | ORAL | 0 refills | Status: AC
Start: 2021-10-24 — End: 2021-11-07

## 2021-10-24 NOTE — Progress Notes (Signed)
Subjective:   Patient ID: Gina Alvarez, female    DOB: 04/09/1991, 31 y.o.   MRN: [id number]  HPI: Gina Alvarez is a 31 y.o. female presenting on 10/24/2021 for No chief complaint on file.   HPI Patient is coming in today after she was called for treatment for chlamydia.  She did come back positive for chlamydia while putting her IUD when she had a Pap smear.  We discussed the risks and benefits of leaving the IUD and that it is possible to treat it and we will go ahead and treat it and then we want her to retest for negative and 3 to 4 weeks.  She will schedule that visit on the way out.  She does not really have a lot of symptoms currently.  She has had 1 partner for the past 6 months.  She has an ex that she said one of the deep end she was with him most recently 8 months ago.  She denies any pelvic pain or fevers or chills  Relevant past medical, surgical, family and social history reviewed and updated as indicated. Interim medical history since our last visit reviewed. Allergies and medications reviewed and updated.  Review of Systems  Constitutional:  Negative for chills and fever.  Eyes:  Negative for redness and visual disturbance.  Respiratory:  Negative for chest tightness and shortness of breath.   Cardiovascular:  Negative for chest pain and leg swelling.  Genitourinary:  Negative for difficulty urinating, dysuria, menstrual problem, pelvic pain, vaginal bleeding, vaginal discharge and vaginal pain.  Musculoskeletal:  Negative for back pain and gait problem.  Skin:  Negative for rash.  Neurological:  Negative for light-headedness and headaches.  Psychiatric/Behavioral:  Negative for agitation and behavioral problems.   All other systems reviewed and are negative.  Per HPI unless specifically indicated above   Allergies as of 10/24/2021   No Known Allergies      Medication List        Accurate as of October 24, 2021  1:32 PM. If you have any questions, ask your  nurse or doctor.          azelastine 0.1 % nasal spray Commonly known as: ASTELIN Place 1 spray into both nostrils 2 (two) times daily.   buPROPion ER 100 MG 12 hr tablet Commonly known as: Wellbutrin SR Take 1 tablet (100 mg total) by mouth daily.   doxycycline 100 MG tablet Commonly known as: VIBRA-TABS Take 1 tablet (100 mg total) by mouth 2 (two) times daily for 14 days. 1 po bid   levocetirizine 5 MG tablet Commonly known as: XYZAL Take 1 tablet (5 mg total) by mouth every evening.   sertraline 100 MG tablet Commonly known as: ZOLOFT Take 1.5 tablets (150 mg total) by mouth daily.   zolpidem 5 MG tablet Commonly known as: AMBIEN Take 0.5-1 tablets (2.5-5 mg total) by mouth at bedtime as needed for sleep.         Objective:   There were no vitals taken for this visit.  Wt Readings from Last 3 Encounters:  10/18/21 153 lb (69.4 kg)  10/11/21 150 lb 12.8 oz (68.4 kg)  09/18/21 149 lb 12.8 oz (67.9 kg)    Physical Exam Vitals and nursing note reviewed.  Constitutional:      Appearance: Normal appearance. She is not ill-appearing or toxic-appearing.  Neurological:     Mental Status: She is alert.    Results for orders placed  or performed in visit on 10/18/21  Pregnancy, urine  Result Value Ref Range   Preg Test, Ur Negative Negative  Cytology - PAP([place])  Result Value Ref Range   Neisseria Gonorrhea Negative    Chlamydia Positive (A)    Adequacy      Satisfactory for evaluation; transformation zone component PRESENT.   Diagnosis      - Negative for intraepithelial lesion or malignancy (NILM)   Comment Normal Reference Ranger Chlamydia - Negative    Comment      Normal Reference Range Neisseria Gonorrhea - Negative    Assessment & Plan:   Problem List Items Addressed This Visit   None Visit Diagnoses     Chlamydia infection    -  Primary   Relevant Medications   cefTRIAXone (ROCEPHIN) injection 1 g (Start on 10/24/2021  1:45 PM)        Patient treated for possible gonorrhea, and sent treatment for chlamydia as well because she came back positive for chlamydia. Follow up plan: Return if symptoms worsen or fail to improve, for 3-4 week retest for chamydia.  Counseling provided for all of the vaccine components No orders of the defined types were placed in this encounter.   Caryl Pina, MD [facility] 10/24/2021, 1:32 PM

## 2021-10-24 NOTE — Telephone Encounter (Signed)
Pt was contacted by [place] and also seen dr Building control surveyor today

## 2021-10-24 NOTE — Addendum Note (Signed)
Addended by: Caryl Pina on: 10/24/2021 01:15 PM   Modules accepted: Orders

## 2021-11-01 ENCOUNTER — Telehealth: Payer: Self-pay

## 2021-11-01 NOTE — Telephone Encounter (Signed)
Sent [facility] labs, recent OV notes for Chlamydia infection

## 2021-11-12 ENCOUNTER — Ambulatory Visit (INDEPENDENT_AMBULATORY_CARE_PROVIDER_SITE_OTHER): Payer: Self-pay | Admitting: Plastic Surgery

## 2021-11-12 DIAGNOSIS — Z8481 Family history of carrier of genetic disease: Secondary | ICD-10-CM

## 2021-11-12 NOTE — Progress Notes (Signed)
Will scheduled for after she has seen General surgery.

## 2021-11-15 ENCOUNTER — Other Ambulatory Visit: Payer: Self-pay | Admitting: Surgery

## 2021-11-15 DIAGNOSIS — Z803 Family history of malignant neoplasm of breast: Secondary | ICD-10-CM | POA: Diagnosis not present

## 2021-11-15 DIAGNOSIS — Z1501 Genetic susceptibility to malignant neoplasm of breast: Secondary | ICD-10-CM | POA: Diagnosis not present

## 2021-11-15 DIAGNOSIS — Z1509 Genetic susceptibility to other malignant neoplasm: Secondary | ICD-10-CM | POA: Diagnosis not present

## 2021-11-28 ENCOUNTER — Ambulatory Visit (INDEPENDENT_AMBULATORY_CARE_PROVIDER_SITE_OTHER): Payer: 59 | Admitting: Plastic Surgery

## 2021-11-28 ENCOUNTER — Other Ambulatory Visit: Payer: Self-pay | Admitting: Surgery

## 2021-11-28 DIAGNOSIS — Z7183 Encounter for nonprocreative genetic counseling: Secondary | ICD-10-CM | POA: Diagnosis not present

## 2021-11-28 DIAGNOSIS — Z1509 Genetic susceptibility to other malignant neoplasm: Secondary | ICD-10-CM

## 2021-11-28 DIAGNOSIS — Z8481 Family history of carrier of genetic disease: Secondary | ICD-10-CM

## 2021-11-28 DIAGNOSIS — Z1501 Genetic susceptibility to malignant neoplasm of breast: Secondary | ICD-10-CM | POA: Diagnosis not present

## 2021-11-28 DIAGNOSIS — Z1502 Genetic susceptibility to malignant neoplasm of ovary: Secondary | ICD-10-CM

## 2021-11-28 DIAGNOSIS — Z803 Family history of malignant neoplasm of breast: Secondary | ICD-10-CM

## 2021-11-28 NOTE — Progress Notes (Signed)
   Subjective:    Patient ID: Gina Alvarez, female    DOB: 05-07-1991, 31 y.o.   MRN: [id number]  The patient is a 31 year old female joining me by phone.  She saw Dr. Ninfa Linden and an MRI has been ordered due to her mom's cancer history.  This patient is BRCA positive.  She does not currently have breast cancer.  She is 4 feet 11 inches tall and weighs 150 pounds.  Her preoperative bra size is a D.  She would like to be around the same size after surgery.  She has 2 kids at home and has had a cholecystectomy.     Review of Systems  Constitutional: Negative.   HENT: Negative.    Eyes: Negative.   Respiratory: Negative.  Negative for chest tightness and shortness of breath.   Cardiovascular: Negative.   Gastrointestinal: Negative.   Endocrine: Negative.   Genitourinary: Negative.   Musculoskeletal: Negative.   Skin: Negative.        Objective:   Physical Exam      Assessment & Plan:     ICD-10-CM   1. FHx: BRCA gene positive  Z84.81        I connected with  Gina Alvarez on 11/28/21 by phone and verified that I am speaking with the correct person using two identifiers. The patient was at home and I was at the office.  We spent 5 minutes in discussion.   I discussed the limitations of evaluation and management by telemedicine. The patient expressed understanding and agreed to proceed.  I would go ahead and submit for bilateral breast reconstruction with expanders and Flex HD.  The patient will let me know when she is got the results from her MRI.

## 2021-12-02 ENCOUNTER — Telehealth: Payer: Self-pay

## 2021-12-02 NOTE — Telephone Encounter (Signed)
Gina Alvarez, LMVM inquiring about co-ordinating surgery with Dr. Ninfa Linden and Dr. Marla Roe.

## 2021-12-06 ENCOUNTER — Ambulatory Visit: Payer: 59 | Admitting: Family Medicine

## 2021-12-06 ENCOUNTER — Encounter: Payer: Self-pay | Admitting: Family Medicine

## 2021-12-13 ENCOUNTER — Ambulatory Visit: Payer: Self-pay | Admitting: Registered"

## 2021-12-15 ENCOUNTER — Other Ambulatory Visit: Payer: 59

## 2021-12-27 ENCOUNTER — Ambulatory Visit (INDEPENDENT_AMBULATORY_CARE_PROVIDER_SITE_OTHER): Payer: 59 | Admitting: Student

## 2021-12-27 VITALS — BP 108/72 | HR 93 | Ht 59.0 in | Wt 157.2 lb

## 2021-12-27 DIAGNOSIS — Z8481 Family history of carrier of genetic disease: Secondary | ICD-10-CM

## 2021-12-27 MED ORDER — ONDANSETRON HCL 4 MG PO TABS: 4.0000 mg | ORAL_TABLET | Freq: Three times a day (TID) | ORAL | 0 refills | Status: DC | PRN

## 2021-12-27 MED ORDER — DIAZEPAM 2 MG PO TABS: 2.0000 mg | ORAL_TABLET | Freq: Two times a day (BID) | ORAL | 0 refills | Status: DC | PRN

## 2021-12-27 MED ORDER — OXYCODONE HCL 5 MG PO TABS: 5.0000 mg | ORAL_TABLET | Freq: Three times a day (TID) | ORAL | 0 refills | Status: DC | PRN

## 2021-12-27 MED ORDER — CEPHALEXIN 500 MG PO CAPS: 500.0000 mg | ORAL_CAPSULE | Freq: Four times a day (QID) | ORAL | 0 refills | Status: AC

## 2021-12-27 NOTE — Progress Notes (Signed)
Patient ID: Gina Alvarez, female    DOB: Oct 16, 1990, 31 y.o.   MRN: [id number]  Chief Complaint  Patient presents with   Pre-op Exam      ICD-10-CM   1. FHx: BRCA gene positive  Z84.81        History of Present Illness: Gina Alvarez is a 31 y.o.  female  with a history of BRCA gene positive.  Patient does not currently have breast cancer. She presents for preoperative evaluation for upcoming procedure, bilateral total mastectomy with Dr. Ninfa Linden and immediate breast reconstruction with placement of Flex HD and tissue expanders with Dr. Marla Roe, scheduled for 01/09/2022.  The patient has not had problems with anesthesia.  Patient reports family history of breast cancer including her mom and her great aunt.  She denies any personal history of breast cancer.  Patient denies any history of cardiac disease.  She states she is not taking any blood thinners.  Patient states she is a non-smoker.  Patient reports she has a copper IUD in place.  She reports history of 1 miscarriage.  She denies any personal family history of blood clots or clotting diseases.  She denies any recent traumas, surgeries, infections, pregnancies, strokes or heart attacks.  She denies any history of inflammatory bowel disease, lung disease or cancer.  Patient states she is currently a D cup, and would like to be a C cup after surgery.  Summary of Previous Visit: Patient was seen on 10/11/2021 by Dr. Marla Roe for consult.  At this visit, patient reported she was BRCA positive, but does not have breast cancer.  The patient at that time expressed interest in immediate breast reconstruction after bilateral mastectomies with expanders and Flex HD.  Patient was later seen by Dr. Marla Roe on 11/28/2021.  Patient reported she saw Dr. Ninfa Linden and an MRI had been ordered due to her mom's cancer history.  Patient reported her preoperative bra size was a D, and she would like to be approximately the same size after  surgery.  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  I discussed with the patient that if she needs any paperwork filled out or a note from [place] to let us know.  PMH Significant for: BRCA positive, gallstones  Chemotherapy/radiation: N/A   Past Medical History: Allergies: No Known Allergies  Current Medications:  Current Outpatient Medications:    azelastine (ASTELIN) 0.1 % nasal spray, Place 1 spray into both nostrils 2 (two) times daily., Disp: 30 mL, Rfl: 12   buPROPion ER (WELLBUTRIN SR) 100 MG 12 hr tablet, Take 1 tablet (100 mg total) by mouth daily., Disp: 90 tablet, Rfl: 0   levocetirizine (XYZAL) 5 MG tablet, Take 1 tablet (5 mg total) by mouth every evening., Disp: 90 tablet, Rfl: 3   sertraline (ZOLOFT) 100 MG tablet, Take 1.5 tablets (150 mg total) by mouth daily., Disp: 135 tablet, Rfl: 3   zolpidem (AMBIEN) 5 MG tablet, Take 0.5-1 tablets (2.5-5 mg total) by mouth at bedtime as needed for sleep., Disp: 15 tablet, Rfl: 5  Past Medical Problems: Past Medical History:  Diagnosis Date   Anxiety    Contraception management 08/19/2012   nexplanon inserted 08/19/12 in left arm   Depression    Irregular menstrual bleeding 09/26/2013   Medical history non-contributory    Nexplanon insertion 06/25/2017   06/25/17   Removed 08/25/17 d/t possible infeciton    Past Surgical History: Past Surgical History:  Procedure Laterality Date  CHOLECYSTECTOMY N/A 09/14/2020   Procedure: LAPAROSCOPIC CHOLECYSTECTOMY;  Surgeon: Lucretia Roers, MD;  Location: [facility];  Service: General;  Laterality: N/A;   EXTRACORPOREAL SHOCK WAVE LITHOTRIPSY Right 06/07/2020   Procedure: EXTRACORPOREAL SHOCK WAVE LITHOTRIPSY (ESWL);  Surgeon: Rene Paci, MD;  Location[facility];  Service: Urology;  Laterality: Right;   I & D EXTREMITY Right 02/14/2020   Procedure: IRRIGATION AND DEBRIDEMENT RIGHT RING FINGER WITH NAILBED REPAIR;  Surgeon: Betha Loa, MD;   Location: [facility];  Service: Orthopedics;  Laterality: Right;   NO PAST SURGERIES     PERCUTANEOUS PINNING Right 02/14/2020   Procedure: PERCUTANEOUS PINNING EXTREMITY;  Surgeon: Betha Loa, MD;  Location: [facility];  Service: Orthopedics;  Laterality: Right;    Social History: Social History   Socioeconomic History   Marital status: Single    Spouse name: Not on file   Number of children: Not on file   Years of education: Not on file   Highest education level: Not on file  Occupational History   Not on file  Tobacco Use   Smoking status: Former    Packs/day: 0.50    Years: 8.00    Total pack years: 4.00    Types: Cigarettes    Quit date: 12/26/2017    Years since quitting: 4.0   Smokeless tobacco: Never  Vaping Use   Vaping Use: Never used  Substance and Sexual Activity   Alcohol use: Never    Comment: maybe 1 drink per month   Drug use: Never   Sexual activity: Not Currently    Partners: Male    Birth control/protection: None  Other Topics Concern   Not on file  Social History Narrative   Not on file   Social Determinants of Health   Financial Resource Strain: Not on file  Food Insecurity: Not on file  Transportation Needs: Not on file  Physical Activity: Not on file  Stress: Not on file  Social Connections: Not on file  Intimate Partner Violence: Not on file    Family History: Family History  Problem Relation Age of Onset   Breast cancer Mother        dx 25-44; BRCA2+; stage III w/ chemo and radiation   Cancer Mother    Asthma Daughter    Lupus Paternal Grandmother        d. 15   Skin cancer Maternal Grandmother        "skin discoloration on arms" - not spotty   Mental illness Maternal Aunt     Review of Systems: Denies fevers, chills, shortness of breath  Physical Exam: Vital Signs There were no vitals taken for this visit.  Physical Exam  Constitutional:      General: Not in acute distress.    Appearance: Normal appearance. Not ill-appearing.   HENT:     Head: Normocephalic and atraumatic.  Cardiovascular:     Rate and Rhythm: Normal rate Pulmonary:     Effort: Pulmonary effort is normal. No respiratory distress.  Musculoskeletal: Normal range of motion.  Lower extremities: No varicose veins noted, nonswollen lower extremities Skin:    General: Skin is warm and dry.     Findings: No erythema or rash.  Neurological:     Mental Status: Alert and oriented to person, place, and time. Mental status is at baseline.  Psychiatric:        Mood and Affect: Mood normal.        Behavior: Behavior normal.  Assessment/Plan: The patient is scheduled for bilateral total mastectomies with Dr. Ninfa Linden and immediate bilateral breast reconstruction with placement and Flex HD and tissue expanders with Dr. Marla Roe.  Risks, benefits, and alternatives of procedure discussed, questions answered and consent obtained.    Smoking Status: Non-smoker; Counseling Given?  N/A Last Mammogram: Patient reports she has never had a mammogram.  She states that Dr. Ninfa Linden was going to order her an MRI, but she found out her insurance would not pay for it so she did not end up getting it done.  Caprini Score: 2; Risk Factors include: length of planned surgery. Recommendation for mechanical prophylaxis. Encourage early ambulation.   Pictures obtained: 10/11/2021  Post-op Rx sent to pharmacy: Oxycodone, Zofran, Keflex, Valium  Discussed with the patient to hold her sertraline and Ambien the day of surgery.  Patient knowledge.  I recommended the patient not take the oxycodone and Valium at the same time as these can be sedating.  Patient acknowledged.  Patient was provided with the breast reconstruction and General Surgical Risk consent document and Pain Medication Agreement prior to their appointment.  They had adequate time to read through the risk consent documents and Pain Medication Agreement. We also discussed them in person together during this  preop appointment. All of their questions were answered to their satisfaction.  Recommended calling if they have any further questions.  Risk consent form and Pain Medication Agreement to be scanned into patient's chart.  The risks that can be encountered with and after placement of a breast expander placement were discussed and include the following but not limited to these: bleeding, infection, delayed healing, anesthesia risks, skin sensation changes, injury to structures including nerves, blood vessels, and muscles which may be temporary or permanent, allergies to tape, suture materials and glues, blood products, topical preparations or injected agents, skin contour irregularities, skin discoloration and swelling, deep vein thrombosis, cardiac and pulmonary complications, pain, which may persist, fluid accumulation, wrinkling of the skin over the expander, changes in nipple or breast sensation, expander leakage or rupture, faulty position of the expander, persistent pain, formation of tight scar tissue around the expander (capsular contracture), possible need for revisional surgery or staged procedures.   Electronically signed by: Clance Boll, PA-C 12/27/2021 2:26 PM

## 2021-12-27 NOTE — H&P (View-Only) (Signed)
Patient ID: Gina Alvarez, female    DOB: [birth year], 31 y.o.   MRN: [id number]  Chief Complaint  Patient presents with   Pre-op Exam      ICD-10-CM   1. FHx: BRCA gene positive  Z84.81        History of Present Illness: Gina Alvarez is a 31 y.o.  female  with a history of BRCA gene positive.  Patient does not currently have breast cancer. She presents for preoperative evaluation for upcoming procedure, bilateral total mastectomy with Dr. Ninfa Linden and immediate breast reconstruction with placement of Flex HD and tissue expanders with Dr. Marla Roe, scheduled for 01/09/2022.  The patient has not had problems with anesthesia.  Patient reports family history of breast cancer including her mom and her great aunt.  She denies any personal history of breast cancer.  Patient denies any history of cardiac disease.  She states she is not taking any blood thinners.  Patient states she is a non-smoker.  Patient reports she has a copper IUD in place.  She reports history of 1 miscarriage.  She denies any personal family history of blood clots or clotting diseases.  She denies any recent traumas, surgeries, infections, pregnancies, strokes or heart attacks.  She denies any history of inflammatory bowel disease, lung disease or cancer.  Patient states she is currently a D cup, and would like to be a C cup after surgery.  Summary of Previous Visit: Patient was seen on 10/11/2021 by Dr. Marla Roe for consult.  At this visit, patient reported she was BRCA positive, but does not have breast cancer.  The patient at that time expressed interest in immediate breast reconstruction after bilateral mastectomies with expanders and Flex HD.  Patient was later seen by Dr. Marla Roe on 11/28/2021.  Patient reported she saw Dr. Ninfa Linden and an MRI had been ordered due to her mom's cancer history.  Patient reported her preoperative bra size was a D, and she would like to be approximately the same size after  surgery.  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  I discussed with the patient that if she needs any paperwork filled out or a note from [place] to let us know.  PMH Significant for: BRCA positive, gallstones  Chemotherapy/radiation: N/A   Past Medical History: Allergies: No Known Allergies  Current Medications:  Current Outpatient Medications:    azelastine (ASTELIN) 0.1 % nasal spray, Place 1 spray into both nostrils 2 (two) times daily., Disp: 30 mL, Rfl: 12   buPROPion ER (WELLBUTRIN SR) 100 MG 12 hr tablet, Take 1 tablet (100 mg total) by mouth daily., Disp: 90 tablet, Rfl: 0   levocetirizine (XYZAL) 5 MG tablet, Take 1 tablet (5 mg total) by mouth every evening., Disp: 90 tablet, Rfl: 3   sertraline (ZOLOFT) 100 MG tablet, Take 1.5 tablets (150 mg total) by mouth daily., Disp: 135 tablet, Rfl: 3   zolpidem (AMBIEN) 5 MG tablet, Take 0.5-1 tablets (2.5-5 mg total) by mouth at bedtime as needed for sleep., Disp: 15 tablet, Rfl: 5  Past Medical Problems: Past Medical History:  Diagnosis Date   Anxiety    Contraception management 08/19/2012   nexplanon inserted 08/19/12 in left arm   Depression    Irregular menstrual bleeding 09/26/2013   Medical history non-contributory    Nexplanon insertion 06/25/2017   06/25/17   Removed 08/25/17 d/t possible infeciton    Past Surgical History: Past Surgical History:  Procedure Laterality Date  CHOLECYSTECTOMY N/A 09/14/2020   Procedure: LAPAROSCOPIC CHOLECYSTECTOMY;  Surgeon: Lucretia Roers, MD;  Location: [facility];  Service: General;  Laterality: N/A;   EXTRACORPOREAL SHOCK WAVE LITHOTRIPSY Right 06/07/2020   Procedure: EXTRACORPOREAL SHOCK WAVE LITHOTRIPSY (ESWL);  Surgeon: Rene Paci, MD;  Location: [facility]  Service: Urology;  Laterality: Right;   I & D EXTREMITY Right 02/14/2020   Procedure: IRRIGATION AND DEBRIDEMENT RIGHT RING FINGER WITH NAILBED REPAIR;  Surgeon: Betha Loa, MD;   Location: [facility];  Service: Orthopedics;  Laterality: Right;   NO PAST SURGERIES     PERCUTANEOUS PINNING Right 02/14/2020   Procedure: PERCUTANEOUS PINNING EXTREMITY;  Surgeon: Betha Loa, MD;  Location: [facility];  Service: Orthopedics;  Laterality: Right;    Social History: Social History   Socioeconomic History   Marital status: Single    Spouse name: Not on file   Number of children: Not on file   Years of education: Not on file   Highest education level: Not on file  Occupational History   Not on file  Tobacco Use   Smoking status: Former    Packs/day: 0.50    Years: 8.00    Total pack years: 4.00    Types: Cigarettes    Quit date: 12/26/2017    Years since quitting: 4.0   Smokeless tobacco: Never  Vaping Use   Vaping Use: Never used  Substance and Sexual Activity   Alcohol use: Never    Comment: maybe 1 drink per month   Drug use: Never   Sexual activity: Not Currently    Partners: Male    Birth control/protection: None  Other Topics Concern   Not on file  Social History Narrative   Not on file   Social Determinants of Health   Financial Resource Strain: Not on file  Food Insecurity: Not on file  Transportation Needs: Not on file  Physical Activity: Not on file  Stress: Not on file  Social Connections: Not on file  Intimate Partner Violence: Not on file    Family History: Family History  Problem Relation Age of Onset   Breast cancer Mother        dx 73-44; BRCA2+; stage III w/ chemo and radiation   Cancer Mother    Asthma Daughter    Lupus Paternal Grandmother        d. 19   Skin cancer Maternal Grandmother        "skin discoloration on arms" - not spotty   Mental illness Maternal Aunt     Review of Systems: Denies fevers, chills, shortness of breath  Physical Exam: Vital Signs There were no vitals taken for this visit.  Physical Exam  Constitutional:      General: Not in acute distress.    Appearance: Normal appearance. Not ill-appearing.   HENT:     Head: Normocephalic and atraumatic.  Cardiovascular:     Rate and Rhythm: Normal rate Pulmonary:     Effort: Pulmonary effort is normal. No respiratory distress.  Musculoskeletal: Normal range of motion.  Lower extremities: No varicose veins noted, nonswollen lower extremities Skin:    General: Skin is warm and dry.     Findings: No erythema or rash.  Neurological:     Mental Status: Alert and oriented to person, place, and time. Mental status is at baseline.  Psychiatric:        Mood and Affect: Mood normal.        Behavior: Behavior normal.  Assessment/Plan: The patient is scheduled for bilateral total mastectomies with Dr. Ninfa Linden and immediate bilateral breast reconstruction with placement and Flex HD and tissue expanders with Dr. Marla Roe.  Risks, benefits, and alternatives of procedure discussed, questions answered and consent obtained.    Smoking Status: Non-smoker; Counseling Given?  N/A Last Mammogram: Patient reports she has never had a mammogram.  She states that Dr. Ninfa Linden was going to order her an MRI, but she found out her insurance would not pay for it so she did not end up getting it done.  Caprini Score: 2; Risk Factors include: length of planned surgery. Recommendation for mechanical prophylaxis. Encourage early ambulation.   Pictures obtained: 10/11/2021  Post-op Rx sent to pharmacy: Oxycodone, Zofran, Keflex, Valium  Discussed with the patient to hold her sertraline and Ambien the day of surgery.  Patient knowledge.  I recommended the patient not take the oxycodone and Valium at the same time as these can be sedating.  Patient acknowledged.  Patient was provided with the breast reconstruction and General Surgical Risk consent document and Pain Medication Agreement prior to their appointment.  They had adequate time to read through the risk consent documents and Pain Medication Agreement. We also discussed them in person together during this  preop appointment. All of their questions were answered to their satisfaction.  Recommended calling if they have any further questions.  Risk consent form and Pain Medication Agreement to be scanned into patient's chart.  The risks that can be encountered with and after placement of a breast expander placement were discussed and include the following but not limited to these: bleeding, infection, delayed healing, anesthesia risks, skin sensation changes, injury to structures including nerves, blood vessels, and muscles which may be temporary or permanent, allergies to tape, suture materials and glues, blood products, topical preparations or injected agents, skin contour irregularities, skin discoloration and swelling, deep vein thrombosis, cardiac and pulmonary complications, pain, which may persist, fluid accumulation, wrinkling of the skin over the expander, changes in nipple or breast sensation, expander leakage or rupture, faulty position of the expander, persistent pain, formation of tight scar tissue around the expander (capsular contracture), possible need for revisional surgery or staged procedures.   Electronically signed by: Clance Boll, PA-C 12/27/2021 2:26 PM

## 2021-12-28 ENCOUNTER — Other Ambulatory Visit: Payer: Self-pay | Admitting: Family Medicine

## 2021-12-28 DIAGNOSIS — F320 Major depressive disorder, single episode, mild: Secondary | ICD-10-CM

## 2022-01-02 ENCOUNTER — Encounter (HOSPITAL_BASED_OUTPATIENT_CLINIC_OR_DEPARTMENT_OTHER): Payer: Self-pay | Admitting: *Deleted

## 2022-01-02 ENCOUNTER — Other Ambulatory Visit: Payer: Self-pay

## 2022-01-03 ENCOUNTER — Ambulatory Visit (INDEPENDENT_AMBULATORY_CARE_PROVIDER_SITE_OTHER): Payer: 59 | Admitting: Family Medicine

## 2022-01-03 ENCOUNTER — Encounter: Payer: Self-pay | Admitting: Family Medicine

## 2022-01-03 ENCOUNTER — Other Ambulatory Visit (HOSPITAL_COMMUNITY)
Admission: RE | Admit: 2022-01-03 | Discharge: 2022-01-03 | Disposition: A | Discharge status: Home / Self Care | Payer: 59 | Source: Ambulatory Visit | Attending: Family Medicine | Admitting: Family Medicine

## 2022-01-03 VITALS — BP 92/60 | HR 75 | Temp 98.2°F | Ht 59.0 in | Wt 157.0 lb

## 2022-01-03 DIAGNOSIS — Z30431 Encounter for routine checking of intrauterine contraceptive device: Secondary | ICD-10-CM | POA: Diagnosis not present

## 2022-01-03 DIAGNOSIS — A749 Chlamydial infection, unspecified: Secondary | ICD-10-CM | POA: Insufficient documentation

## 2022-01-03 NOTE — Progress Notes (Signed)
BP 92/60   Pulse 75   Temp 98.2 F (36.8 C)   Ht 4' 11" (1.499 m)   Wt 157 lb (71.2 kg)   LMP 01/02/2022 (Exact Date)   SpO2 94%   BMI 31.71 kg/m    Subjective:   Patient ID: Gina Alvarez, female    DOB: 1990/07/18, 31 y.o.   MRN: [id number]  HPI: Gina Alvarez is a 31 y.o. female presenting on 01/03/2022 for Medical Management of Chronic Issues   HPI IUD and STD recheck Patient is coming in today for IUD and STD recheck.  She had come back positive for chlamydia and had an IUD in place when we found out she was positive and has kept the IUD in place.  She has a Software engineer.  She feels like ParaGard is doing better for her and would like to keep it.  She was treated for chlamydia and was cleared treated.  She is coming in today for recheck.  She had spotting most of the first month and a half but now she had a normal cycle and feels like things are doing better.  She was also really sibling diagnosed with BRCA and is going in for double mastectomy.  Relevant past medical, surgical, family and social history reviewed and updated as indicated. Interim medical history since our last visit reviewed. Allergies and medications reviewed and updated.  Review of Systems  Genitourinary:  Negative for urgency, vaginal bleeding, vaginal discharge and vaginal pain.    Per HPI unless specifically indicated above   Allergies as of 01/03/2022   No Known Allergies      Medication List        Accurate as of January 03, 2022  2:31 PM. If you have any questions, ask your nurse or doctor.          azelastine 0.1 % nasal spray Commonly known as: ASTELIN Place 1 spray into both nostrils 2 (two) times daily.   buPROPion ER 100 MG 12 hr tablet Commonly known as: WELLBUTRIN SR TAKE ONE TABLET BY MOUTH EVERY DAY   diazepam 2 MG tablet Commonly known as: Valium Take 1 tablet (2 mg total) by mouth every 12 (twelve) hours as needed for up to 20 doses for muscle spasms.   levocetirizine  5 MG tablet Commonly known as: XYZAL Take 1 tablet (5 mg total) by mouth every evening.   ondansetron 4 MG tablet Commonly known as: Zofran Take 1 tablet (4 mg total) by mouth every 8 (eight) hours as needed for up to 20 doses for nausea or vomiting.   oxyCODONE 5 MG immediate release tablet Commonly known as: Roxicodone Take 1 tablet (5 mg total) by mouth every 8 (eight) hours as needed for up to 20 doses for severe pain.   sertraline 100 MG tablet Commonly known as: ZOLOFT Take 1.5 tablets (150 mg total) by mouth daily.   zolpidem 5 MG tablet Commonly known as: AMBIEN Take 0.5-1 tablets (2.5-5 mg total) by mouth at bedtime as needed for sleep.         Objective:   BP 92/60   Pulse 75   Temp 98.2 F (36.8 C)   Ht 4' 11" (1.499 m)   Wt 157 lb (71.2 kg)   LMP 01/02/2022 (Exact Date)   SpO2 94%   BMI 31.71 kg/m   Wt Readings from Last 3 Encounters:  01/03/22 157 lb (71.2 kg)  12/27/21 157 lb 3.2 oz (71.3 kg)  10/18/21 153 lb (  69.4 kg)    Physical Exam Vitals and nursing note reviewed. Exam conducted with a chaperone present.  Constitutional:      Appearance: Normal appearance.  Genitourinary:    Exam position: Lithotomy position.     Labia:        Right: No rash or tenderness.        Left: No rash or tenderness.      Vagina: Vaginal discharge present. No tenderness or bleeding.     Cervix: Discharge present. No cervical motion tenderness.     Adnexa: Right adnexa normal and left adnexa normal.       Right: No mass or tenderness.         Left: No mass or tenderness.       Comments: IUD strings in place Neurological:     Mental Status: She is alert.       Assessment & Plan:   Problem List Items Addressed This Visit   None Visit Diagnoses     Chlamydia infection    -  Primary   Relevant Orders   Urine cytology ancillary only   IUD check up       Relevant Orders   Urine cytology ancillary only     We will see her chlamydia retest comes back.   She still has some amount of discharge but denies any pain.  Follow up plan: Return if symptoms worsen or fail to improve.  Counseling provided for all of the vaccine components No orders of the defined types were placed in this encounter.   Caryl Pina, MD [facility] 01/03/2022, 2:31 PM

## 2022-01-06 MED ORDER — ENSURE PRE-SURGERY PO LIQD: 296.0000 mL | Freq: Once | ORAL | Status: DC

## 2022-01-06 NOTE — Progress Notes (Signed)

## 2022-01-07 LAB — URINE CYTOLOGY ANCILLARY ONLY
Chlamydia: NEGATIVE
Comment: NEGATIVE
Comment: NORMAL
Neisseria Gonorrhea: NEGATIVE

## 2022-01-08 NOTE — H&P (Signed)
 PROVIDER: DOUGLAS ALLEN BLACKMAN, MD  MRN: [id number] DOB: 09/26/1990  Subjective   Chief Complaint: New Consultation (Breast Cancer )   History of Present Illness: Gina Alvarez is a 31 y.o. female who is seen today as an consultation for evaluation of positive genetics for breast cancer.   This is a 31-year-old female who has a family history of breast cancer in her mother. Her mother was diagnosed at age 40 with stage III breast cancer. She is genetically positive for the breast cancer gene. The patient has had genetic testing and is BRCA2 positive. She has already seen plastic surgery and wants to proceed with bilateral mastectomies and reconstruction. She has had no previous problems regarding her breast. She denies nipple discharge. She has had no previous imaging of her breast. She is otherwise healthy without complaints.  Review of Systems: A complete review of systems was obtained from the patient. I have reviewed this information and discussed as appropriate with the patient. See HPI as well for other ROS.  ROS   Medical History: Past Medical History:  Diagnosis Date  Anxiety   Patient Active Problem List  Diagnosis  FHx: BRCA gene positive  Laceration of right ring finger with foreign body and damage to nail  Open displaced fracture of distal phalanx of right ring finger with routine healing  Urinary retention  Calculus of gallbladder without cholecystitis without obstruction   Past Surgical History:  Procedure Laterality Date  Finger Surgery 2022  CHOLECYSTECTOMY 2022    No Known Allergies  Current Outpatient Medications on File Prior to Visit  Medication Sig Dispense Refill  buPROPion (WELLBUTRIN SR) 100 MG SR tablet Take 100 mg by mouth once daily  levocetirizine (XYZAL) 5 MG tablet Take 5 mg by mouth every evening  sertraline (ZOLOFT) 100 MG tablet Take by mouth  zolpidem (AMBIEN) 5 MG tablet Take by mouth   No current facility-administered medications  on file prior to visit.   History reviewed. No pertinent family history.   Social History   Tobacco Use  Smoking Status Former  Types: Cigarettes  Quit date: 2019  Years since quitting: 4.4  Smokeless Tobacco Never    Social History   Socioeconomic History  Marital status: Single  Tobacco Use  Smoking status: Former  Types: Cigarettes  Quit date: 2019  Years since quitting: 4.4  Smokeless tobacco: Never  Substance and Sexual Activity  Alcohol use: Not Currently  Drug use: Never   Objective:   Vitals:  11/15/21 1049  BP: 110/60  Pulse: 82  Temp: 36.2 C (97.1 F)  SpO2: 98%  Weight: 70.5 kg (155 lb 6.4 oz)  Height: 149.9 cm (4' 11")   Body mass index is 31.39 kg/m.  Physical Exam   She appears well on exam  He has incised the breast bilaterally. The nipple areolar complexes are normal. There are no palpable breast masses and no axillary adenopathy on either side  Labs, Imaging and Diagnostic Testing: I have reviewed her notes from the plastic surgeon and her notes in the electronic medical records  Assessment and Plan:   Diagnoses and all orders for this visit:  BRCA2 positive - MRI breast bilateral with and without contrast    She is well aware of her risk of breast cancer given her genetics and her mother's history of breast cancer and wants to go ahead and proceed with risk reduction surgery with bilateral mastectomies. Again, she is already seen plastic surgery regarding this. I believe she needs   bilateral breast MRIs given the density of her breast preoperatively to make sure she is not developing anything suspicious or early malignancy in her breast prior to surgery. I will call her back with the results of the MRI. We will tentatively fill out the paperwork for surgery as well.   Addendum: Her insurance would not pay for her MRI of her breast so we will have to forego this.  Bilateral total mastectomies with immediate reconstruction has been  scheduled in conjunction with plastic surgery.  I will discuss injecting mag trace bilaterally for lymph node mapping in case the final pathology does determine she has breast cancer in either breast 

## 2022-01-09 ENCOUNTER — Encounter (HOSPITAL_BASED_OUTPATIENT_CLINIC_OR_DEPARTMENT_OTHER)
Admission: RE | Disposition: A | Discharge status: Home / Self Care | Payer: Self-pay | Source: Home / Self Care | Attending: Plastic Surgery

## 2022-01-09 ENCOUNTER — Observation Stay (HOSPITAL_BASED_OUTPATIENT_CLINIC_OR_DEPARTMENT_OTHER)
Admission: RE | Admit: 2022-01-09 | Discharge: 2022-01-10 | Disposition: A | Discharge status: Home / Self Care | Payer: 59 | Attending: Plastic Surgery | Admitting: Plastic Surgery

## 2022-01-09 ENCOUNTER — Encounter (HOSPITAL_BASED_OUTPATIENT_CLINIC_OR_DEPARTMENT_OTHER): Payer: Self-pay | Admitting: Surgery

## 2022-01-09 ENCOUNTER — Other Ambulatory Visit: Payer: Self-pay

## 2022-01-09 ENCOUNTER — Ambulatory Visit (HOSPITAL_BASED_OUTPATIENT_CLINIC_OR_DEPARTMENT_OTHER): Payer: 59 | Admitting: Anesthesiology

## 2022-01-09 DIAGNOSIS — Z87891 Personal history of nicotine dependence: Secondary | ICD-10-CM | POA: Insufficient documentation

## 2022-01-09 DIAGNOSIS — C50919 Malignant neoplasm of unspecified site of unspecified female breast: Secondary | ICD-10-CM

## 2022-01-09 DIAGNOSIS — N6081 Other benign mammary dysplasias of right breast: Secondary | ICD-10-CM | POA: Diagnosis not present

## 2022-01-09 DIAGNOSIS — Z1509 Genetic susceptibility to other malignant neoplasm: Secondary | ICD-10-CM

## 2022-01-09 DIAGNOSIS — Z1501 Genetic susceptibility to malignant neoplasm of breast: Secondary | ICD-10-CM

## 2022-01-09 DIAGNOSIS — D241 Benign neoplasm of right breast: Secondary | ICD-10-CM | POA: Insufficient documentation

## 2022-01-09 DIAGNOSIS — Z803 Family history of malignant neoplasm of breast: Secondary | ICD-10-CM | POA: Diagnosis not present

## 2022-01-09 DIAGNOSIS — Z79899 Other long term (current) drug therapy: Secondary | ICD-10-CM | POA: Diagnosis not present

## 2022-01-09 DIAGNOSIS — Z8481 Family history of carrier of genetic disease: Secondary | ICD-10-CM | POA: Insufficient documentation

## 2022-01-09 DIAGNOSIS — Z421 Encounter for breast reconstruction following mastectomy: Secondary | ICD-10-CM | POA: Diagnosis not present

## 2022-01-09 DIAGNOSIS — D242 Benign neoplasm of left breast: Secondary | ICD-10-CM | POA: Diagnosis not present

## 2022-01-09 DIAGNOSIS — Z4001 Encounter for prophylactic removal of breast: Secondary | ICD-10-CM

## 2022-01-09 DIAGNOSIS — Z01818 Encounter for other preprocedural examination: Secondary | ICD-10-CM

## 2022-01-09 HISTORY — PX: SIMPLE MASTECTOMY WITH AXILLARY SENTINEL NODE BIOPSY: SHX6098

## 2022-01-09 HISTORY — PX: BREAST RECONSTRUCTION WITH PLACEMENT OF TISSUE EXPANDER AND FLEX HD (ACELLULAR HYDRATED DERMIS): SHX6295

## 2022-01-09 LAB — POCT PREGNANCY, URINE: Preg Test, Ur: NEGATIVE

## 2022-01-09 SURGERY — SIMPLE MASTECTOMY
Anesthesia: General | Site: Chest | Laterality: Bilateral

## 2022-01-09 MED ORDER — POLYETHYLENE GLYCOL 3350 17 G PO PACK: 17.0000 g | PACK | Freq: Every day | ORAL | Status: DC | PRN

## 2022-01-09 MED ORDER — SCOPOLAMINE 1 MG/3DAYS TD PT72
MEDICATED_PATCH | TRANSDERMAL | Status: AC
  Filled 2022-01-09: qty 1

## 2022-01-09 MED ORDER — MIDAZOLAM HCL 2 MG/2ML IJ SOLN
INTRAMUSCULAR | Status: AC
  Filled 2022-01-09: qty 2

## 2022-01-09 MED ORDER — CEFAZOLIN SODIUM-DEXTROSE 2-4 GM/100ML-% IV SOLN
INTRAVENOUS | Status: AC
  Filled 2022-01-09: qty 100

## 2022-01-09 MED ORDER — KETAMINE HCL 50 MG/5ML IJ SOSY
PREFILLED_SYRINGE | INTRAMUSCULAR | Status: AC
  Filled 2022-01-09: qty 5

## 2022-01-09 MED ORDER — SUGAMMADEX SODIUM 200 MG/2ML IV SOLN
INTRAVENOUS | Status: DC | PRN
  Administered 2022-01-09: 200 mg via INTRAVENOUS

## 2022-01-09 MED ORDER — CHLORHEXIDINE GLUCONATE CLOTH 2 % EX PADS: 6.0000 | MEDICATED_PAD | Freq: Once | CUTANEOUS | Status: DC

## 2022-01-09 MED ORDER — MIDAZOLAM HCL 5 MG/5ML IJ SOLN
INTRAMUSCULAR | Status: DC | PRN
  Administered 2022-01-09 (×2): 1 mg via INTRAVENOUS

## 2022-01-09 MED ORDER — HYDROMORPHONE HCL 1 MG/ML IJ SOLN
INTRAMUSCULAR | Status: AC
  Filled 2022-01-09: qty 0.5

## 2022-01-09 MED ORDER — SCOPOLAMINE 1 MG/3DAYS TD PT72
1.0000 | MEDICATED_PATCH | Freq: Once | TRANSDERMAL | Status: DC
  Administered 2022-01-09: 1.5 mg via TRANSDERMAL

## 2022-01-09 MED ORDER — AMISULPRIDE (ANTIEMETIC) 5 MG/2ML IV SOLN
10.0000 mg | Freq: Once | INTRAVENOUS | Status: DC | PRN
Start: 2022-01-09 — End: 2022-01-09

## 2022-01-09 MED ORDER — DIPHENHYDRAMINE HCL 12.5 MG/5ML PO ELIX: 12.5000 mg | ORAL_SOLUTION | Freq: Four times a day (QID) | ORAL | Status: DC | PRN

## 2022-01-09 MED ORDER — DIPHENHYDRAMINE HCL 50 MG/ML IJ SOLN
12.5000 mg | Freq: Four times a day (QID) | INTRAMUSCULAR | Status: DC | PRN
  Administered 2022-01-09: 12.5 mg via INTRAVENOUS
  Filled 2022-01-09: qty 1

## 2022-01-09 MED ORDER — HYDROMORPHONE HCL 1 MG/ML IJ SOLN
0.2500 mg | INTRAMUSCULAR | Status: DC | PRN
  Administered 2022-01-09 (×4): 0.5 mg via INTRAVENOUS

## 2022-01-09 MED ORDER — DIPHENHYDRAMINE HCL 50 MG/ML IJ SOLN
12.5000 mg | Freq: Once | INTRAMUSCULAR | Status: AC
  Administered 2022-01-09: 12.5 mg via INTRAVENOUS

## 2022-01-09 MED ORDER — FENTANYL CITRATE (PF) 100 MCG/2ML IJ SOLN
INTRAMUSCULAR | Status: AC
  Filled 2022-01-09: qty 2

## 2022-01-09 MED ORDER — OXYCODONE HCL 5 MG PO TABS
5.0000 mg | ORAL_TABLET | Freq: Three times a day (TID) | ORAL | Status: DC | PRN
  Administered 2022-01-09: 5 mg via ORAL
  Filled 2022-01-09: qty 1

## 2022-01-09 MED ORDER — PROPOFOL 10 MG/ML IV BOLUS
INTRAVENOUS | Status: AC
  Filled 2022-01-09: qty 20

## 2022-01-09 MED ORDER — ONDANSETRON HCL 4 MG/2ML IJ SOLN
INTRAMUSCULAR | Status: AC
  Filled 2022-01-09: qty 2

## 2022-01-09 MED ORDER — ONDANSETRON HCL 4 MG/2ML IJ SOLN: 4.0000 mg | Freq: Four times a day (QID) | INTRAMUSCULAR | Status: DC | PRN

## 2022-01-09 MED ORDER — HYDROMORPHONE HCL 1 MG/ML IJ SOLN
INTRAMUSCULAR | Status: AC
  Filled 2022-01-09: qty 1.5

## 2022-01-09 MED ORDER — ACETAMINOPHEN 500 MG PO TABS
1000.0000 mg | ORAL_TABLET | ORAL | Status: AC
  Administered 2022-01-09: 1000 mg via ORAL

## 2022-01-09 MED ORDER — BISACODYL 10 MG RE SUPP: 10.0000 mg | Freq: Every day | RECTAL | Status: DC | PRN

## 2022-01-09 MED ORDER — CEFAZOLIN SODIUM-DEXTROSE 2-4 GM/100ML-% IV SOLN
2.0000 g | INTRAVENOUS | Status: AC
  Administered 2022-01-09: 2 g via INTRAVENOUS

## 2022-01-09 MED ORDER — SUGAMMADEX SODIUM 500 MG/5ML IV SOLN
INTRAVENOUS | Status: AC
  Filled 2022-01-09: qty 5

## 2022-01-09 MED ORDER — IBUPROFEN 200 MG PO TABS
400.0000 mg | ORAL_TABLET | Freq: Four times a day (QID) | ORAL | Status: DC
  Administered 2022-01-10 (×2): 400 mg via ORAL
  Filled 2022-01-09 (×2): qty 2

## 2022-01-09 MED ORDER — LIDOCAINE HCL (CARDIAC) PF 100 MG/5ML IV SOSY
PREFILLED_SYRINGE | INTRAVENOUS | Status: DC | PRN
  Administered 2022-01-09: 100 mg via INTRAVENOUS

## 2022-01-09 MED ORDER — ROCURONIUM BROMIDE 100 MG/10ML IV SOLN
INTRAVENOUS | Status: DC | PRN
  Administered 2022-01-09: 20 mg via INTRAVENOUS
  Administered 2022-01-09: 50 mg via INTRAVENOUS

## 2022-01-09 MED ORDER — KCL IN DEXTROSE-NACL 20-5-0.45 MEQ/L-%-% IV SOLN
INTRAVENOUS | Status: DC
Start: 2022-01-09 — End: 2022-01-10
  Filled 2022-01-09: qty 1000

## 2022-01-09 MED ORDER — FENTANYL CITRATE (PF) 100 MCG/2ML IJ SOLN
INTRAMUSCULAR | Status: DC | PRN
  Administered 2022-01-09 (×3): 25 ug via INTRAVENOUS
  Administered 2022-01-09: 100 ug via INTRAVENOUS
  Administered 2022-01-09: 25 ug via INTRAVENOUS

## 2022-01-09 MED ORDER — MELATONIN 3 MG PO TABS: 3.0000 mg | ORAL_TABLET | Freq: Every evening | ORAL | Status: DC | PRN

## 2022-01-09 MED ORDER — PROPOFOL 500 MG/50ML IV EMUL
INTRAVENOUS | Status: DC | PRN
  Administered 2022-01-09: 20 ug/kg/min via INTRAVENOUS

## 2022-01-09 MED ORDER — ONDANSETRON HCL 4 MG/2ML IJ SOLN
INTRAMUSCULAR | Status: DC | PRN
  Administered 2022-01-09: 4 mg via INTRAVENOUS

## 2022-01-09 MED ORDER — DIAZEPAM 2 MG PO TABS
2.0000 mg | ORAL_TABLET | Freq: Two times a day (BID) | ORAL | Status: DC | PRN
  Administered 2022-01-09: 2 mg via ORAL
  Filled 2022-01-09: qty 1

## 2022-01-09 MED ORDER — SODIUM CHLORIDE 0.9 % IV SOLN
INTRAVENOUS | Status: AC
  Filled 2022-01-09: qty 10

## 2022-01-09 MED ORDER — KETAMINE HCL 10 MG/ML IJ SOLN
INTRAMUSCULAR | Status: DC | PRN
  Administered 2022-01-09: 10 mg via INTRAVENOUS
  Administered 2022-01-09: 30 mg via INTRAVENOUS

## 2022-01-09 MED ORDER — ACETAMINOPHEN 500 MG PO TABS
ORAL_TABLET | ORAL | Status: AC
  Filled 2022-01-09: qty 2

## 2022-01-09 MED ORDER — ACETAMINOPHEN 325 MG PO TABS
325.0000 mg | ORAL_TABLET | Freq: Four times a day (QID) | ORAL | Status: DC
  Administered 2022-01-10 (×2): 325 mg via ORAL
  Filled 2022-01-09 (×2): qty 1

## 2022-01-09 MED ORDER — LACTATED RINGERS IV SOLN: INTRAVENOUS | Status: DC

## 2022-01-09 MED ORDER — HYDROMORPHONE HCL 1 MG/ML IJ SOLN: 1.0000 mg | INTRAMUSCULAR | Status: DC | PRN

## 2022-01-09 MED ORDER — SODIUM CHLORIDE 0.9 % IV SOLN
INTRAVENOUS | Status: DC | PRN
  Administered 2022-01-09: 500 mL

## 2022-01-09 MED ORDER — ONDANSETRON 4 MG PO TBDP: 4.0000 mg | ORAL_TABLET | Freq: Four times a day (QID) | ORAL | Status: DC | PRN

## 2022-01-09 MED ORDER — PROPOFOL 10 MG/ML IV BOLUS
INTRAVENOUS | Status: DC | PRN
  Administered 2022-01-09: 150 mg via INTRAVENOUS

## 2022-01-09 MED ORDER — HYDROMORPHONE HCL 1 MG/ML IJ SOLN
INTRAMUSCULAR | Status: DC | PRN
  Administered 2022-01-09 (×2): .5 mg via INTRAVENOUS

## 2022-01-09 MED ORDER — CEFAZOLIN SODIUM-DEXTROSE 2-4 GM/100ML-% IV SOLN: 2.0000 g | INTRAVENOUS | Status: DC

## 2022-01-09 MED ORDER — DEXAMETHASONE SODIUM PHOSPHATE 10 MG/ML IJ SOLN
INTRAMUSCULAR | Status: AC
Start: 2022-01-09 — End: ?
  Filled 2022-01-09: qty 1

## 2022-01-09 MED ORDER — CEFAZOLIN SODIUM-DEXTROSE 2-4 GM/100ML-% IV SOLN
2.0000 g | Freq: Three times a day (TID) | INTRAVENOUS | Status: DC
  Administered 2022-01-09 – 2022-01-10 (×2): 2 g via INTRAVENOUS
  Filled 2022-01-09 (×2): qty 100

## 2022-01-09 MED ORDER — DEXAMETHASONE SODIUM PHOSPHATE 10 MG/ML IJ SOLN
INTRAMUSCULAR | Status: DC | PRN
  Administered 2022-01-09: 8 mg via INTRAVENOUS

## 2022-01-09 MED ORDER — ROCURONIUM BROMIDE 10 MG/ML (PF) SYRINGE
PREFILLED_SYRINGE | INTRAVENOUS | Status: AC
  Filled 2022-01-09: qty 10

## 2022-01-09 MED ORDER — LIDOCAINE 2% (20 MG/ML) 5 ML SYRINGE
INTRAMUSCULAR | Status: AC
  Filled 2022-01-09: qty 5

## 2022-01-09 SURGICAL SUPPLY — 93 items
ADH SKN CLS APL DERMABOND .7 (GAUZE/BANDAGES/DRESSINGS) ×4
APL PRP STRL LF DISP 70% ISPRP (MISCELLANEOUS) ×2
APPLIER CLIP 9.375 MED OPEN (MISCELLANEOUS) ×2
APR CLP MED 9.3 20 MLT OPN (MISCELLANEOUS) ×2
BAG DECANTER FOR FLEXI CONT (MISCELLANEOUS) ×3 IMPLANT
BINDER BREAST LRG (GAUZE/BANDAGES/DRESSINGS) IMPLANT
BINDER BREAST MEDIUM (GAUZE/BANDAGES/DRESSINGS) IMPLANT
BINDER BREAST XLRG (GAUZE/BANDAGES/DRESSINGS) IMPLANT
BINDER BREAST XXLRG (GAUZE/BANDAGES/DRESSINGS) IMPLANT
BIOPATCH RED 1 DISK 7.0 (GAUZE/BANDAGES/DRESSINGS) ×3 IMPLANT
BLADE CLIPPER SURG (BLADE) IMPLANT
BLADE HEX COATED 2.75 (ELECTRODE) ×3 IMPLANT
BLADE SURG 10 STRL SS (BLADE) IMPLANT
BLADE SURG 15 STRL LF DISP TIS (BLADE) ×3 IMPLANT
BLADE SURG 15 STRL SS (BLADE) ×6
BNDG GAUZE DERMACEA FLUFF (GAUZE/BANDAGES/DRESSINGS)
BNDG GAUZE DERMACEA FLUFF 4 (GAUZE/BANDAGES/DRESSINGS) ×6 IMPLANT
BNDG GZE DERMACEA 4 6PLY (GAUZE/BANDAGES/DRESSINGS)
CANISTER SUCT 1200ML W/VALVE (MISCELLANEOUS) ×3 IMPLANT
CHLORAPREP W/TINT 26 (MISCELLANEOUS) ×3 IMPLANT
CLIP APPLIE 9.375 MED OPEN (MISCELLANEOUS) ×3 IMPLANT
COVER BACK TABLE 60X90IN (DRAPES) ×3 IMPLANT
COVER MAYO STAND STRL (DRAPES) ×3 IMPLANT
DERMABOND ADVANCED (GAUZE/BANDAGES/DRESSINGS) ×4
DERMABOND ADVANCED .7 DNX12 (GAUZE/BANDAGES/DRESSINGS) ×3 IMPLANT
DRAIN CHANNEL 19F RND (DRAIN) ×3 IMPLANT
DRAPE LAPAROSCOPIC ABDOMINAL (DRAPES) ×3 IMPLANT
DRAPE UTILITY XL STRL (DRAPES) ×3 IMPLANT
DRSG OPSITE POSTOP 4X6 (GAUZE/BANDAGES/DRESSINGS) IMPLANT
DRSG PAD ABDOMINAL 8X10 ST (GAUZE/BANDAGES/DRESSINGS) ×6 IMPLANT
DRSG TEGADERM 2-3/8X2-3/4 SM (GAUZE/BANDAGES/DRESSINGS) ×3 IMPLANT
ELECT BLADE 4.0 EZ CLEAN MEGAD (MISCELLANEOUS) ×2
ELECT REM PT RETURN 9FT ADLT (ELECTROSURGICAL) ×2
ELECTRODE BLDE 4.0 EZ CLN MEGD (MISCELLANEOUS) ×3 IMPLANT
ELECTRODE REM PT RTRN 9FT ADLT (ELECTROSURGICAL) ×3 IMPLANT
EVACUATOR SILICONE 100CC (DRAIN) ×3 IMPLANT
FUNNEL KELLER 2 DISP (MISCELLANEOUS) IMPLANT
GAUZE SPONGE 4X4 12PLY STRL LF (GAUZE/BANDAGES/DRESSINGS) IMPLANT
GLOVE BIO SURGEON STRL SZ 6.5 (GLOVE) ×6 IMPLANT
GLOVE BIOGEL PI IND STRL 6.5 (GLOVE) IMPLANT
GLOVE BIOGEL PI IND STRL 7.0 (GLOVE) IMPLANT
GLOVE BIOGEL PI INDICATOR 6.5 (GLOVE) ×2
GLOVE BIOGEL PI INDICATOR 7.0 (GLOVE) ×2
GLOVE ECLIPSE 6.5 STRL STRAW (GLOVE) IMPLANT
GLOVE SURG SIGNA 7.5 PF LTX (GLOVE) ×3 IMPLANT
GOWN STRL REUS W/ TWL LRG LVL3 (GOWN DISPOSABLE) ×9 IMPLANT
GOWN STRL REUS W/ TWL XL LVL3 (GOWN DISPOSABLE) ×3 IMPLANT
GOWN STRL REUS W/TWL LRG LVL3 (GOWN DISPOSABLE) ×6
GOWN STRL REUS W/TWL XL LVL3 (GOWN DISPOSABLE) ×2
HEMOSTAT ARISTA ABSORB 3G PWDR (HEMOSTASIS) IMPLANT
HEMOSTAT SURGICEL 2X14 (HEMOSTASIS) IMPLANT
IMPL EXPANDER BREAST 535CC (Breast) IMPLANT
IMPLANT EXPANDER BREAST 535CC (Breast) ×4 IMPLANT
IV NS 1000ML (IV SOLUTION)
IV NS 1000ML BAXH (IV SOLUTION) IMPLANT
IV NS 500ML (IV SOLUTION) ×2
IV NS 500ML BAXH (IV SOLUTION) ×3 IMPLANT
KIT FILL ASEPTIC TRANSFER (MISCELLANEOUS) IMPLANT
NDL HYPO 25X1 1.5 SAFETY (NEEDLE) IMPLANT
NDL SAFETY ECLIPSE 18X1.5 (NEEDLE) IMPLANT
NEEDLE HYPO 18GX1.5 SHARP (NEEDLE)
NEEDLE HYPO 25X1 1.5 SAFETY (NEEDLE) IMPLANT
NS IRRIG 1000ML POUR BTL (IV SOLUTION) ×3 IMPLANT
PACK BASIN DAY SURGERY FS (CUSTOM PROCEDURE TRAY) ×3 IMPLANT
PAD FOAM SILICONE BACKED (GAUZE/BANDAGES/DRESSINGS) IMPLANT
PENCIL SMOKE EVACUATOR (MISCELLANEOUS) ×3 IMPLANT
PIN SAFETY STERILE (MISCELLANEOUS) ×3 IMPLANT
SLEEVE SCD COMPRESS KNEE MED (STOCKING) ×3 IMPLANT
SPIKE FLUID TRANSFER (MISCELLANEOUS) IMPLANT
SPONGE T-LAP 18X18 ~~LOC~~+RFID (SPONGE) ×6 IMPLANT
STRIP SUTURE WOUND CLOSURE 1/2 (MISCELLANEOUS) IMPLANT
SUT ETHILON 2 0 FS 18 (SUTURE) ×3 IMPLANT
SUT MNCRL AB 4-0 PS2 18 (SUTURE) ×6 IMPLANT
SUT MON AB 3-0 SH 27 (SUTURE) ×4
SUT MON AB 3-0 SH27 (SUTURE) ×3 IMPLANT
SUT MON AB 5-0 PS2 18 (SUTURE) IMPLANT
SUT PDS 3-0 CT2 (SUTURE) ×4
SUT PDS AB 2-0 CT2 27 (SUTURE) IMPLANT
SUT PDS II 3-0 CT2 27 ABS (SUTURE) IMPLANT
SUT SILK 2 0 SH (SUTURE) ×3 IMPLANT
SUT SILK 3 0 PS 1 (SUTURE) ×3 IMPLANT
SUT VIC AB 3-0 SH 27 (SUTURE)
SUT VIC AB 3-0 SH 27X BRD (SUTURE) ×6 IMPLANT
SUT VICRYL 3-0 CR8 SH (SUTURE) ×3 IMPLANT
SYR BULB EAR ULCER 3OZ GRN STR (SYRINGE) IMPLANT
SYR BULB IRRIG 60ML STRL (SYRINGE) ×3 IMPLANT
SYR CONTROL 10ML LL (SYRINGE) IMPLANT
TISSUE FLEXHD PERF PLIAB 8X16 (Tissue) IMPLANT
TOWEL GREEN STERILE FF (TOWEL DISPOSABLE) ×6 IMPLANT
TRAY DSU PREP LF (CUSTOM PROCEDURE TRAY) ×3 IMPLANT
TUBE CONNECTING 20X1/4 (TUBING) ×3 IMPLANT
UNDERPAD 30X36 HEAVY ABSORB (UNDERPADS AND DIAPERS) ×6 IMPLANT
YANKAUER SUCT BULB TIP NO VENT (SUCTIONS) ×3 IMPLANT

## 2022-01-09 NOTE — Interval H&P Note (Signed)
History and Physical Interval Note:no change in H and P  01/09/2022 11:16 AM  Gina Alvarez  has presented today for surgery, with the diagnosis of BRCA 2 POSITIVE GENETICS.  The various methods of treatment have been discussed with the patient and family. After consideration of risks, benefits and other options for treatment, the patient has consented to  Procedure(s) with comments: BILATERAL TOTAL MASTECTOMY (Bilateral) - LMA BREAST RECONSTRUCTION WITH PLACEMENT OF TISSUE EXPANDER AND FLEX HD (ACELLULAR HYDRATED DERMIS) (Bilateral) as a surgical intervention.  The patient's history has been reviewed, patient examined, no change in status, stable for surgery.  I have reviewed the patient's chart and labs.  Questions were answered to the patient's satisfaction.     Coralie Keens

## 2022-01-09 NOTE — Discharge Instructions (Addendum)
INSTRUCTIONS FOR AFTER BREAST SURGERY   You will likely have some questions about what to expect following your operation.  The following information will help you and your family understand what to expect when you are discharged from the hospital.  Following these guidelines will help ensure a smooth recovery and reduce risks of complications.  Postoperative instructions include information on: diet, wound care, medications and physical activity.  AFTER SURGERY Expect to go home after the procedure.  In some cases, you may need to spend one night in the hospital for observation.  DIET Breast surgery does not require a specific diet.  However, the healthier you eat the better your body can start healing. It is important to increasing your protein intake.  This means limiting the foods with sugar and carbohydrates.  Focus on vegetables and some meat.  If you have any liposuction during your procedure be sure to drink water.  If your urine is bright yellow, then it is concentrated, and you need to drink more water.  As a general rule after surgery, you should have 8 ounces of water every hour while awake.  If you find you are persistently nauseated or unable to take in liquids let us know.  NO TOBACCO USE or EXPOSURE.  This will slow your healing process and increase the risk of a wound.  WOUND CARE Leave the binder on for 3 days . Use fragrance free soap.   After 3 days you can remove the binder to shower. Once dry apply ACE wrap, binder or sports bra.  Use a mild soap like Dial, Dove and [place]. You may have Topifoam or Lipofoam on.  It is soft and spongy and helps keep you from getting creases if you have liposuction.  This can be removed before the shower and then replaced.  If you need more it is available on [employer] (Lipofoam). If you have steri-strips / tape directly attached to your skin leave them in place. It is OK to get these wet.   No baths, pools or hot tubs for four weeks. We close your  incision to leave the smallest and best-looking scar. No ointment or creams on your incisions until given the go ahead.  Especially not Neosporin (Too many skin reactions with this one).  A few weeks after surgery you can use Mederma and start massaging the scar. We ask you to wear your binder or sports bra for the first 6 weeks around the clock, including while sleeping. This provides added comfort and helps reduce the fluid accumulation at the surgery site.  ACTIVITY No heavy lifting until cleared by the doctor.  This usually means no more than a half-gallon of milk.  It is OK to walk and climb stairs. In fact, moving your legs is very important to decrease your risk of a blood clot.  It will also help keep you from getting deconditioned.  Every 1 to 2 hours get up and walk for 5 minutes. This will help with a quicker recovery back to normal.  Let pain be your guide so you don't do too much.  This is not the time for spring cleaning and don't plan on taking care of anyone else.  This time is for you to recover,  You will be more comfortable if you sleep and rest with your head elevated either with a few pillows under you or in a recliner.  No stomach sleeping for a three months.  WORK Everyone returns to work at different times.  As a rough guide, most people take at least 1 - 2 weeks off prior to returning to work. If you need documentation for your job, bring the forms to your postoperative follow up visit.  DRIVING Arrange for someone to bring you home from the hospital.  You may be able to drive a few days after surgery but not while taking any narcotics or valium.  BOWEL MOVEMENTS Constipation can occur after anesthesia and while taking pain medication.  It is important to stay ahead for your comfort.  We recommend taking Milk of Magnesia (2 tablespoons; twice a day) while taking the pain pills.  MEDICATIONS You may be prescribed should start after surgery At your preoperative visit for you  history and physical you may have been given the following medications: An antibiotic: Start this medication when you get home and take according to the instructions on the bottle. Zofran 4 mg:  This is to treat nausea and vomiting.  You can take this every 6 hours as needed and only if needed. Valium 2 mg: This is for muscle tightness if you have an implant or expander. This will help relax your muscle which also helps with pain control.  This can be taken every 12 hours as needed. Don't drive after taking this medication. Norco (hydrocodone/acetaminophen) 5/325 mg:  This is only to be used after you have taken the motrin or the tylenol. Every 8 hours as needed.   Over the counter Medication to take: Ibuprofen (Motrin) 600 mg:  Take this every 6 hours.  If you have additional pain then take 500 mg of the tylenol every 8 hours.  Only take the Norco after you have tried these two. Miralax or stool softener of choice: Take this according to the bottle if you take the [place] Call your surgeon's office if any of the following occur: Fever 101 degrees F or greater Excessive bleeding or fluid from the incision site. Pain that increases over time without aid from the medications Redness, warmth, or pus draining from incision sites Persistent nausea or inability to take in liquids Severe misshapen area that underwent the operation.  Here are some resources:  Plastic surgery website: https://www.plasticsurgery.org/for-medical-professionals/education-and-resources/publications/breast-reconstruction-magazine Breast Reconstruction Awareness Campaign:  [employer] Plastic surgery Implant information:  https://www.plasticsurgery.org/patient-safety/breast-implant-safety  About my Jackson-Pratt Bulb Drain  What is a Jackson-Pratt bulb? A Jackson-Pratt is a soft, round device used to collect drainage. It is connected to a long, thin drainage catheter, which is held in  place by one or two small stiches near your surgical incision site. When the bulb is squeezed, it forms a vacuum, forcing the drainage to empty into the bulb.  Emptying the Jackson-Pratt bulb- To empty the bulb: 1. Release the plug on the top of the bulb. 2. Pour the bulb's contents into a measuring container which your nurse will provide. 3. Record the time emptied and amount of drainage. Empty the drain(s) as often as your     doctor or nurse recommends.  Date                  Time                    Amount (Drain 1)                 Amount (Drain 2)  _____________________________________________________________________  _____________________________________________________________________  _____________________________________________________________________  _____________________________________________________________________  _____________________________________________________________________  _____________________________________________________________________  _____________________________________________________________________  _____________________________________________________________________  Squeezing the Jackson-Pratt Bulb- To squeeze the bulb:  1. Make sure the plug at the top of the bulb is open. 2. Squeeze the bulb tightly in your fist. You will hear air squeezing from the bulb. 3. Replace the plug while the bulb is squeezed. 4. Use a safety pin to attach the bulb to your clothing. This will keep the catheter from     pulling at the bulb insertion site.  When to call your doctor- Call your doctor if: Drain site becomes red, swollen or hot. You have a fever greater than 101 degrees F. There is oozing at the drain site. Drain falls out (apply a guaze bandage over the drain hole and secure it with tape). Drainage increases daily not related to activity patterns. (You will usually have more drainage when you are active than when you are resting.) Drainage has  a bad odor.

## 2022-01-09 NOTE — Op Note (Signed)
Op report    DATE OF OPERATION:  01/09/2022  LOCATION: [place]  SURGICAL DIVISION: Plastic Surgery  PREOPERATIVE DIAGNOSES:  1. BRCA positive for Breast cancer risk.    POSTOPERATIVE DIAGNOSES:  1. BRCA positive for Breast cancer.   PROCEDURE:  1. Bilateral immediate breast reconstruction with placement of Acellular Dermal Matrix and tissue expanders.  SURGEON: Emeli Goguen Sanger Nanette Wirsing, DO  ASSISTANT: Donnamarie Rossetti, PA  ANESTHESIA:  General.   COMPLICATIONS: None.   IMPLANTS: Left - Mentor 535 cc. Ref #[id number]. 200 cc of injectable saline placed in the expander. Right - Mentor 535 cc. Ref #[id number]. 200 cc of injectable saline placed in the expander. Acellular Dermal Matrix Flex HD 8 x 16 cm  INDICATIONS FOR PROCEDURE:  The patient, Gina Alvarez, is a 31 y.o. female born on Nov 12, 1990, is here for  immediate first stage breast reconstruction with placement of bilateral tissue expander and Acellular dermal matrix. MRN: [id number]  CONSENT:  Informed consent was obtained directly from the patient. Risks, benefits and alternatives were fully discussed. Specific risks including but not limited to bleeding, infection, hematoma, seroma, scarring, pain, implant infection, implant extrusion, capsular contracture, asymmetry, wound healing problems, and need for further surgery were all discussed. The patient did have an ample opportunity to have her questions answered to her satisfaction.   DESCRIPTION OF PROCEDURE:  The patient was taken to the operating room by the general surgery team. SCDs were placed and IV antibiotics were given. The patient's chest was prepped and draped in a sterile fashion. A time out was performed and the implants to be used were identified.  Bilateral mastectomies were performed.  Once the general surgery team had completed their portion of the case the patient was rendered to the plastic and reconstructive surgery  team.  Left:  The pectoralis major muscle was lifted from the chest wall with release of the lateral edge and lateral inframammary fold.  The pocket was irrigated with antibiotic solution and hemostasis was achieved with electrocautery.  The ADM was then prepared according to the manufacture guidelines and slits placed to help with postoperative fluid management.  The ADM was then sutured to the inferior and lateral edge of the inframammary fold with 2-0 PDS starting with an interrupted stitch and then a running stitch.  The lateral portion was sutured to with interrupted sutures after the expander was placed.  The expander was prepared according to the manufacture guidelines, the air evacuated and then it was placed under the ADM and pectoralis major muscle.  The inferior and lateral tabs were used to secure the expander to the chest wall with 2-0 PDS.  The drain was placed at the inframammary fold over the ADM and secured to the skin with 3-0 Silk.  The deep layers were closed with 3-0 PDS and 3-0 Monocryl.  The skin was closed with 4-0 Monocryl and then dermabond was applied.    Right:  The pectoralis major muscle was lifted from the chest wall with release of the lateral edge and lateral inframammary fold.  The pocket was irrigated with antibiotic solution and hemostasis was achieved with electrocautery.  The ADM was then prepared according to the manufacture guidelines and slits placed to help with postoperative fluid management.  The ADM was then sutured to the inferior and lateral edge of the inframammary fold with 2-0 PDS starting with an interrupted stitch and then a running stitch.  The lateral portion was sutured to with interrupted sutures after the expander  was placed.  The expander was prepared according to the manufacture guidelines, the air evacuated and then it was placed under the ADM and pectoralis major muscle.  The inferior and lateral tabs were used to secure the expander to the chest wall  with 2-0 PDS.  The drain was placed at the inframammary fold over the ADM and secured to the skin with 3-0 Silk.  The deep layers were closed with 3-0 PDS and 3-0 Monocryl. The skin was closed with 4-0 Monocryl and then dermabond was applied.  The ABDs and breast binder were placed.  The patient tolerated the procedure well and there were no complications.  The patient was allowed to wake from anesthesia and taken to the recovery room in satisfactory condition.   The advanced practice practitioner (APP) assisted throughout the case.  The APP was essential in retraction and counter traction when needed to make the case progress smoothly.  This retraction and assistance made it possible to see the tissue plans for the procedure.  The assistance was needed for blood control, tissue re-approximation and assisted with closure of the incision site.

## 2022-01-09 NOTE — Op Note (Signed)
   Gina Alvarez 01/09/2022   Pre-op Diagnosis: BRCA 2 POSITIVE GENETICS     Post-op Diagnosis: same  Procedure(s): BILATERAL TOTAL MASTECTOMY  Surgeon: Dr. Coralie Keens  Anesthesia: General  Staff:  Circulator: Ted Mcalpine, RN Scrub Person: Izora Ribas, RN  Estimated Blood Loss: Minimal               Specimens:sent to pathology  Indications: This is a 31 year old female who was BRCA 2 genetically today.  She has decided to proceed with bilateral total mastectomies with immediate reconstruction   Procedure: The patient is brought to operating identifies correct patient.  She is placed upon the operating table general anesthesia was induced.  Her bilateral breast and chest were then prepped and draped in usual sterile fashion.  A performed elliptical incision around the left breast nipple areolar complex with a scalpel.  This was performed transversely.  I then dissected down to the breast tissue circumferentially with electrocautery.  I then created the superior skin flap staying underneath the dermis going toward the clavicle and down to her chest wall.  I took the dissection then medially toward the sternum.  I then created the inferior skin flap staying underneath the skin and dermis going down to the inframammary ridge.  I then dissected the skin flaps out laterally to the latissimus muscle.  I again dissected circumferentially down to the chest wall.  I then slowly dissected the breast tissue off the pectoralis fascia with electrocautery dissecting medial to lateral until recent level of the lateral edge of the pectoralis muscle in the axilla.  I then completed the mastectomy with electrocautery.  I took more tissue on the lateral flap which felt thick and with electrocautery and sent this with the specimen as well.  At this point hemostasis appeared to be achieved.  I made a matching elliptical incision on the patient's right breast incorporating the nipple areolar  complex as well.  Again this was done transversely.  I again dissected down to the breast tissue with electrocautery.  I again created a superior skin flap staying just underneath the skin and dermis with electrocautery dissecting superiorly toward the clavicle and down to the chest wall.  I dissected medially to the sternum and then inferiorly on the inferior skin flap toward the inframammary ridge.  I then took the dissection laterally toward the axilla and latissimus with the electrocautery.  Once the skin flaps were completed I then dissected the breast tissue off the pectoralis fascia with electrocautery dissecting medial to lateral.  I then completed mastectomy just past the pectoralis muscle.  The right breast was then sent to pathology as well for evaluation.  Hemostasis at both sites appear to be achieved.  I left a laparotomy pad on each side.  At this point Dr. Marla Roe presented to the room to start the immediate reconstruction.  The patient remained in a stable condition still intubated.  Coralie Keens   Date: 01/09/2022  Time: 2:42 PM

## 2022-01-09 NOTE — Transfer of Care (Signed)
Immediate Anesthesia Transfer of Care Note  Patient: Gina Alvarez  Procedure(s) Performed: BILATERAL TOTAL MASTECTOMY (Bilateral: Breast) BILATERAL BREAST RECONSTRUCTION WITH PLACEMENT OF TISSUE EXPANDER AND FLEX HD (ACELLULAR HYDRATED DERMIS) (Bilateral: Chest)  Patient Location: PACU  Anesthesia Type:General  Level of Consciousness: awake, alert , oriented and drowsy  Airway & Oxygen Therapy: Patient Spontanous Breathing and Patient connected to face mask oxygen  Post-op Assessment: Report given to RN and Post -op Vital signs reviewed and stable  Post vital signs: Reviewed and stable  Last Vitals:  Vitals Value Taken Time  BP 124/76 01/09/22 1615  Temp    Pulse 98 01/09/22 1616  Resp 15 01/09/22 1616  SpO2 99 % 01/09/22 1616  Vitals shown include unvalidated device data.  Last Pain:  Vitals:   01/09/22 1132  TempSrc: Oral  PainSc: 0-No pain         Complications: No notable events documented.

## 2022-01-09 NOTE — Anesthesia Procedure Notes (Signed)
Procedure Name: Intubation Date/Time: 01/09/2022 1:33 PM  Performed by: Garrel Ridgel, CRNAPre-anesthesia Checklist: Patient identified, Emergency Drugs available, Suction available and Patient being monitored Patient Re-evaluated:Patient Re-evaluated prior to induction Oxygen Delivery Method: Circle system utilized Preoxygenation: Pre-oxygenation with 100% oxygen Induction Type: IV induction Ventilation: Mask ventilation without difficulty Laryngoscope Size: Mac and 3 Grade View: Grade I Tube type: Oral Tube size: 7.0 mm Number of attempts: 1 Airway Equipment and Method: Stylet and Oral airway Placement Confirmation: ETT inserted through vocal cords under direct vision, positive ETCO2 and breath sounds checked- equal and bilateral Secured at: 21 cm Tube secured with: Tape Dental Injury: Teeth and Oropharynx as per pre-operative assessment

## 2022-01-09 NOTE — Anesthesia Postprocedure Evaluation (Signed)
Anesthesia Post Note  Patient: Gina Alvarez  Procedure(s) Performed: BILATERAL TOTAL MASTECTOMY (Bilateral: Breast) BILATERAL BREAST RECONSTRUCTION WITH PLACEMENT OF TISSUE EXPANDER AND FLEX HD (ACELLULAR HYDRATED DERMIS) (Bilateral: Chest)     Patient location during evaluation: PACU Anesthesia Type: General Level of consciousness: awake and alert Pain management: pain level controlled Vital Signs Assessment: post-procedure vital signs reviewed and stable Respiratory status: spontaneous breathing, nonlabored ventilation and respiratory function stable Cardiovascular status: blood pressure returned to baseline Postop Assessment: no apparent nausea or vomiting Anesthetic complications: no   No notable events documented.  Last Vitals:  Vitals:   01/09/22 1645 01/09/22 1700  BP: 117/78 105/73  Pulse: 100 97  Resp: 18   Temp:    SpO2: 94% 97%    Last Pain:  Vitals:   01/09/22 1645  TempSrc:   PainSc: [place]

## 2022-01-09 NOTE — Interval H&P Note (Signed)
History and Physical Interval Note:  01/09/2022 2:09 PM  Gina Alvarez  has presented today for surgery, with the diagnosis of BRCA 2 POSITIVE GENETICS.  The various methods of treatment have been discussed with the patient and family. After consideration of risks, benefits and other options for treatment, the patient has consented to  Procedure(s) with comments: BILATERAL TOTAL MASTECTOMY (Bilateral) - LMA BILATERAL BREAST RECONSTRUCTION WITH PLACEMENT OF TISSUE EXPANDER AND FLEX HD (ACELLULAR HYDRATED DERMIS) (Bilateral) as a surgical intervention.  The patient's history has been reviewed, patient examined, no change in status, stable for surgery.  I have reviewed the patient's chart and labs.  Questions were answered to the patient's satisfaction.     Loel Lofty Evalyne Cortopassi

## 2022-01-09 NOTE — Anesthesia Preprocedure Evaluation (Addendum)
Anesthesia Evaluation  Patient identified by MRN, date of birth, ID band Patient awake    Reviewed: Allergy & Precautions, NPO status , Patient's Chart, lab work & pertinent test results  History of Anesthesia Complications Negative for: history of anesthetic complications  Airway Mallampati: II  TM Distance: >3 FB Neck ROM: Full    Dental no notable dental hx.    Pulmonary former smoker,    Pulmonary exam normal        Cardiovascular negative cardio ROS Normal cardiovascular exam     Neuro/Psych Anxiety Depression negative neurological ROS     GI/Hepatic negative GI ROS, Neg liver ROS,   Endo/Other  negative endocrine ROS  Renal/GU negative Renal ROS  negative genitourinary   Musculoskeletal negative musculoskeletal ROS (+)   Abdominal   Peds  Hematology negative hematology ROS (+)   Anesthesia Other Findings Day of surgery medications reviewed with patient.  Reproductive/Obstetrics negative OB ROS                            Anesthesia Physical Anesthesia Plan  ASA: 2  Anesthesia Plan: General   Post-op Pain Management: Tylenol PO (pre-op)* and Toradol IV (intra-op)*   Induction: Intravenous  PONV Risk Score and Plan: 3 and Treatment may vary due to age or medical condition, Ondansetron, Dexamethasone, Midazolam, TIVA, Propofol infusion and Scopolamine patch - Pre-op  Airway Management Planned: Oral ETT  Additional Equipment: None  Intra-op Plan:   Post-operative Plan: Extubation in OR  Informed Consent: I have reviewed the patients History and Physical, chart, labs and discussed the procedure including the risks, benefits and alternatives for the proposed anesthesia with the patient or authorized representative who has indicated his/her understanding and acceptance.     Dental advisory given  Plan Discussed with: CRNA  Anesthesia Plan Comments:       Anesthesia  Quick Evaluation

## 2022-01-10 DIAGNOSIS — Z4001 Encounter for prophylactic removal of breast: Secondary | ICD-10-CM | POA: Diagnosis not present

## 2022-01-10 NOTE — Discharge Summary (Signed)
Physician Discharge Summary  Patient ID: Gina Alvarez MRN: [id number] DOB/AGE: July 04, 1990 31 y.o.  Admit date: 01/09/2022 Discharge date: 01/10/2022  Admission Diagnoses:  Discharge Diagnoses:  Principal Problem:   Breast cancer [facility]) Active Problems:   BRCA positive   Discharged Condition: good  Hospital Course: Patient is a 31 year old female with history of BRCA positive gene for breast cancer risk.  Patient presented yesterday to the [facility] surgery day center.  She underwent bilateral total mastectomy with Dr. Ninfa Linden and then bilateral immediate breast reconstruction with placement of acellular dermal matrix and tissue expanders with Dr. Marla Roe.  Patient is postop day 1.  Upon entering the room, patient is sleeping comfortably in bed.  Patient reports that she is doing well today.  She denies any acute events overnight.  She denies any fevers or chills.  Patient reports that her pain is controlled.  Patient reports that she has been up and ambulating, and has been going to the bathroom without issue.  She states that she has been passing gas.  Patient reports she has been eating and drinking without issue.  Patient states that she would like to go home today.  Consults: None  Significant Diagnostic Studies: N/A  Treatments: Surgery: Bilateral total mastectomies with bilateral immediate breast reconstruction and placement of acellular dermal matrix and tissue expanders  Discharge Exam: Blood pressure 105/68, pulse 76, temperature 98 F (36.7 C), resp. rate 16, height $RemoveBe'4\' 11"'BXAGSVIDI$  (1.499 m), weight 71.3 kg, last menstrual period 01/02/2022, SpO2 96 %. General appearance: alert, cooperative, and no distress Resp: Unlabored breathing, no respiratory distress Breasts: Breast expanders are in place bilaterally.  There is some mild ecchymosis noted to each breast.  There is no overlying erythema.  Breasts are soft and symmetric bilaterally.  Honeycomb dressings are in place over  the incisions.  They are clean dry and intact.  Drains are in place and functioning bilaterally.  There is approximately 20 cc of serosanguineous drainage in each bulb.  Disposition: Discharge disposition: 01-Home or Self Care     All of the patient's questions were answered.  I instructed her to call us if she has any other questions or concerns.    Discharge Instructions     (HEART FAILURE PATIENTS) Call MD:  Anytime you have any of the following symptoms: 1) 3 pound weight gain in 24 hours or 5 pounds in 1 week 2) shortness of breath, with or without a dry hacking cough 3) swelling in the hands, feet or stomach 4) if you have to sleep on extra pillows at night in order to breathe.   Complete by: As directed    Call MD for:  difficulty breathing, headache or visual disturbances   Complete by: As directed    Call MD for:  extreme fatigue   Complete by: As directed    Call MD for:  hives   Complete by: As directed    Call MD for:  persistant dizziness or light-headedness   Complete by: As directed    Call MD for:  persistant nausea and vomiting   Complete by: As directed    Call MD for:  redness, tenderness, or signs of infection (pain, swelling, redness, odor or green/yellow discharge around incision site)   Complete by: As directed    Call MD for:  severe uncontrolled pain   Complete by: As directed    Call MD for:  temperature >100.4   Complete by: As directed    Diet -  low sodium heart healthy   Complete by: As directed    Increase activity slowly   Complete by: As directed       Allergies as of 01/10/2022   No Known Allergies      Medication List     TAKE these medications    azelastine 0.1 % nasal spray Commonly known as: ASTELIN Place 1 spray into both nostrils 2 (two) times daily.   buPROPion ER 100 MG 12 hr tablet Commonly known as: WELLBUTRIN SR TAKE ONE TABLET BY MOUTH EVERY DAY   diazepam 2 MG tablet Commonly known as: Valium Take 1 tablet (2 mg  total) by mouth every 12 (twelve) hours as needed for up to 20 doses for muscle spasms.   levocetirizine 5 MG tablet Commonly known as: XYZAL Take 1 tablet (5 mg total) by mouth every evening.   ondansetron 4 MG tablet Commonly known as: Zofran Take 1 tablet (4 mg total) by mouth every 8 (eight) hours as needed for up to 20 doses for nausea or vomiting.   oxyCODONE 5 MG immediate release tablet Commonly known as: Roxicodone Take 1 tablet (5 mg total) by mouth every 8 (eight) hours as needed for up to 20 doses for severe pain.   sertraline 100 MG tablet Commonly known as: ZOLOFT Take 1.5 tablets (150 mg total) by mouth daily.   zolpidem 5 MG tablet Commonly known as: AMBIEN Take 0.5-1 tablets (2.5-5 mg total) by mouth at bedtime as needed for sleep.        Follow-up Information     Dillingham, Loel Lofty, DO Follow up in 10 day(s).   Specialty: Plastic Surgery Contact information: [address] [place] [address] [phone]                 [facility] [address] [place], [place] [address] [phone]  Signed: Clance Boll 01/10/2022, 7:30 AM

## 2022-01-10 NOTE — Addendum Note (Signed)
Addendum  created 01/10/22 0729 by Willa Frater, CRNA   Charge Capture section accepted, Visit diagnoses modified

## 2022-01-13 ENCOUNTER — Encounter (HOSPITAL_BASED_OUTPATIENT_CLINIC_OR_DEPARTMENT_OTHER): Payer: Self-pay | Admitting: Surgery

## 2022-01-13 LAB — SURGICAL PATHOLOGY

## 2022-01-21 ENCOUNTER — Ambulatory Visit (INDEPENDENT_AMBULATORY_CARE_PROVIDER_SITE_OTHER): Payer: 59 | Admitting: Plastic Surgery

## 2022-01-21 ENCOUNTER — Encounter: Payer: Self-pay | Admitting: Plastic Surgery

## 2022-01-21 DIAGNOSIS — Z1501 Genetic susceptibility to malignant neoplasm of breast: Secondary | ICD-10-CM

## 2022-01-21 DIAGNOSIS — Z1509 Genetic susceptibility to other malignant neoplasm: Secondary | ICD-10-CM

## 2022-01-21 MED ORDER — DIAZEPAM 2 MG PO TABS: 2.0000 mg | ORAL_TABLET | Freq: Four times a day (QID) | ORAL | 0 refills | Status: DC | PRN

## 2022-01-21 MED ORDER — HYDROCODONE-ACETAMINOPHEN 5-325 MG PO TABS: 1.0000 | ORAL_TABLET | Freq: Two times a day (BID) | ORAL | 0 refills | Status: AC | PRN

## 2022-01-21 NOTE — Progress Notes (Signed)
   Subjective:    Patient ID: Gina Alvarez, female    DOB: November 13, 1990, 31 y.o.   MRN: [id number]  The patient is a 31 year old female for follow-up after undergoing bilateral mastectomies 8/17.  Overall she is doing well.  Her drain output is as expected.  She is tolerating the pain well.  No sign of seroma or hematoma.  Her skin is intact and has good color.      Review of Systems  Constitutional: Negative.   Eyes: Negative.   Respiratory: Negative.    Cardiovascular: Negative.        Objective:   Physical Exam Constitutional:      Appearance: Normal appearance.  Cardiovascular:     Rate and Rhythm: Normal rate.     Pulses: Normal pulses.  Skin:    Capillary Refill: Capillary refill takes less than 2 seconds.  Neurological:     Mental Status: She is alert.  Psychiatric:        Mood and Affect: Mood normal.        Behavior: Behavior normal.        Thought Content: Thought content normal.         Assessment & Plan:     ICD-10-CM   1. BRCA positive  Z15.01    Z15.09       Will refill her Valium and pain medicine.  Encouraged her to use that mostly just at nighttime and after she has received the fills.  We will plan to remove the drains at her next visit.  We placed injectable saline in the Expander using a sterile technique: Right: 50 cc for a total of 250 / 535 cc Left: 50 cc for a total of 250 / 535 cc

## 2022-01-22 ENCOUNTER — Telehealth: Payer: Self-pay | Admitting: *Deleted

## 2022-01-22 NOTE — Telephone Encounter (Signed)
Faxed order,demographics,copy of insurance information, and recent office notes to [facility] [place] for [employer] for the patient.    Confirmation received and copy scanned into the chart.//AB/CMA

## 2022-01-28 ENCOUNTER — Ambulatory Visit (INDEPENDENT_AMBULATORY_CARE_PROVIDER_SITE_OTHER): Payer: 59 | Admitting: Student

## 2022-01-28 ENCOUNTER — Telehealth: Payer: Self-pay | Admitting: *Deleted

## 2022-01-28 DIAGNOSIS — Z1509 Genetic susceptibility to other malignant neoplasm: Secondary | ICD-10-CM

## 2022-01-28 DIAGNOSIS — Z1501 Genetic susceptibility to malignant neoplasm of breast: Secondary | ICD-10-CM

## 2022-01-28 DIAGNOSIS — Z9013 Acquired absence of bilateral breasts and nipples: Secondary | ICD-10-CM

## 2022-01-28 NOTE — Progress Notes (Signed)
Patient is a 31 year old female with history of BRCA positive gene.  Patient underwent bilateral total mastectomy with Dr. Ninfa Linden followed by immediate breast reconstruction with placement of Flex HD and tissue expanders with Dr. Marla Roe on 01/09/2022.  Patient had Mentor 535 cc tissue expanders placed bilaterally intraoperatively.  Patient presents to the clinic today for follow-up and possible drain removal.  Patient was last seen in the clinic on 01/21/2022 by Dr. Marla Roe.  At this visit, patient was doing well.  Patient had injectable saline placed in expanders bilaterally.  50 cc were placed in each expander for a total of 250 cc / 535 cc bilaterally.  Today, patient reports she is doing well.  She is accompanied by her mother at bedside.  She states that she is ready to get the drains out.  She denies any issues or complaints.  She denies any fevers or chills.  Patient reports that she did not have to empty her drains yesterday.  Otherwise, the drains have been putting out approximately 10 to 20 cc/day.  Patient states that she does not want an expander fill today, and has an appointment for an expander fill next week.  Chaperone present on exam.  On exam, patient is sitting upright in no acute distress.  Breast expanders are in place bilaterally.  There is no overlying erythema or ecchymosis.  Honeycomb dressings were removed.  Incisions appeared intact with Steri-Strips.  Drains are in place bilaterally and are functioning.  There is approximately 5 to 10 cc of serous drainage in each bulb.  Drains were removed bilaterally.  Patient tolerated well.  Vaseline and gauze were placed over the drain sites.  I discussed with the patient that she may place Vaseline and gauze over the drain sites daily.  I discussed with the patient that she should continue compression.  She states that she has an appointment with [facility] today for a fitting of a compression bra.  Otherwise, she states she  is going to wear her breast binder.  Plan to see patient back next week for another expander fill.  All of the patient's questions and the patient's mother's questions were answered to their satisfaction.  Instructed patient to call if she has any questions or concerns.

## 2022-01-28 NOTE — Telephone Encounter (Signed)
Received on (01/28/2022) via of fax from [facility] [place] [facility] Standard Written Order requesting signature and return.  Given to provider to sign and return.    DME Standard Written Order signed and faxed back to [facility] [place].  Confirmation received and copy scanned into the chart.//AB/CMA

## 2022-02-03 ENCOUNTER — Other Ambulatory Visit: Payer: Self-pay | Admitting: Student

## 2022-02-06 ENCOUNTER — Telehealth: Payer: Self-pay

## 2022-02-06 NOTE — Telephone Encounter (Signed)
MEDICATION:  diazepam (VALIUM) 2 MG tablet  PHARMACY:[facility] [place], [address]  Comments:   **Let patient know to contact pharmacy at the end of the day to make sure medication is ready. **  ** Please notify patient to allow 48-72 hours to process**  **Encourage patient to contact the pharmacy for refills or they can request refills through [facility]**

## 2022-02-12 ENCOUNTER — Other Ambulatory Visit: Payer: Self-pay | Admitting: Student

## 2022-02-12 ENCOUNTER — Telehealth: Payer: Self-pay | Admitting: Plastic Surgery

## 2022-02-12 MED ORDER — DIAZEPAM 2 MG PO TABS: 2.0000 mg | ORAL_TABLET | Freq: Two times a day (BID) | ORAL | 0 refills | Status: DC | PRN

## 2022-02-12 NOTE — Telephone Encounter (Signed)
Patient states she called on 02/06/2022 to have her valium refilled and telephone message was sent. Please call her back if prescription is sent over to her pharmacy and when.

## 2022-02-12 NOTE — Progress Notes (Signed)
Patient called requesting refill on valium. I called the patient and she states that she has some discomfort and soreness with the expander. I discussed with the patient that I will send her more valium. I discussed with the patient that if she is having pain she should try tylenol or ibuprofen first. I also discussed with the patient that she should not take valium and her ambien at the same time as these can be sedating.  Patient expressed understanding.

## 2022-02-14 ENCOUNTER — Encounter: Payer: 59 | Admitting: Student

## 2022-02-21 ENCOUNTER — Encounter: Payer: Self-pay | Admitting: Student

## 2022-02-21 ENCOUNTER — Ambulatory Visit (INDEPENDENT_AMBULATORY_CARE_PROVIDER_SITE_OTHER): Payer: 59 | Admitting: Student

## 2022-02-21 ENCOUNTER — Encounter (INDEPENDENT_AMBULATORY_CARE_PROVIDER_SITE_OTHER): Payer: 59 | Admitting: Family Medicine

## 2022-02-21 DIAGNOSIS — Z9013 Acquired absence of bilateral breasts and nipples: Secondary | ICD-10-CM

## 2022-02-21 DIAGNOSIS — Z1509 Genetic susceptibility to other malignant neoplasm: Secondary | ICD-10-CM

## 2022-02-21 DIAGNOSIS — M62838 Other muscle spasm: Secondary | ICD-10-CM | POA: Diagnosis not present

## 2022-02-21 DIAGNOSIS — Z1501 Genetic susceptibility to malignant neoplasm of breast: Secondary | ICD-10-CM

## 2022-02-21 MED ORDER — TIZANIDINE HCL 4 MG PO TABS: 4.0000 mg | ORAL_TABLET | Freq: Three times a day (TID) | ORAL | 1 refills | Status: DC | PRN

## 2022-02-21 NOTE — Progress Notes (Signed)
Patient is a 31-year-old female with history of BRCA positive gene.  Patient underwent bilateral total mastectomy with Dr. Blackman followed by immediate breast reconstruction with placement of Flex HD and tissue expanders with Dr. Dillingham on 01/09/2022.  Patient had Mentor 535 cc tissue expanders placed bilaterally intraoperatively.  Patient presents to the clinic today for follow-up.   Patient had 50 cc placed in each expander for a total of 250 cc / 535 cc bilaterally on 01/21/2022.    Patient was last seen in the clinic on 01/28/2022.  At this visit, she was doing well.  On exam, breast expanders were in place bilaterally.  The incisions were intact with Steri-Strips.  The drains were removed bilaterally.  Plan was for patient to follow-up in about a week or so for another expander fill.  Today, patient reports she is doing well.  She reports that she sometimes has a little bit of discomfort with the expanders, and sometimes takes Valium for the discomfort.  She states overall her pain is controlled though she denies any fevers or chills.  She denies any nausea or vomiting.  She denies any issues at the surgical site.  Patient states she would like an expander fill today.  Chaperone present on exam.  On exam, patient is sitting upright in no acute distress.  Present in place bilaterally.  There is no overlying erythema or ecchymosis.  There are no significant fluid collections palpated on exam.  Incision is intact with Steri-Strips.  Steri-Strips were removed.  Incision is clean and intact.  We placed injectable saline in the Expander using a sterile technique: Right: 50 cc for a total of 300 cc/535 cc Left: 50 cc for a total of 300 cc/535 cc  I with the patient that she should continue compressive garments.  I discussed with the patient that she could put Vaseline daily over her incision sites.  I instructed the patient to call if she has any questions or concerns.  Patient to follow-up next  week for expander fill.    

## 2022-02-21 NOTE — Telephone Encounter (Signed)

## 2022-02-27 ENCOUNTER — Ambulatory Visit: Payer: 59 | Admitting: Student

## 2022-03-06 NOTE — Progress Notes (Signed)
31 year old female here for follow-up after bilateral breast reconstruction with placement of tissue expanders and Flex HD with Dr. Marla Roe on 01/09/2022.  She is approximately 2 months postop.  She has 300 cc out of 535 cc in each expander. Patient reports she is overall doing well.  She reports she has overall tolerated her fills but has had some occasional tenderness.    She reports she would like to be about a large B/small C cup.  She is not having any infectious symptoms.  Chaperone present on exam On exam bilateral mastectomy incisions are intact, healing well.  There is no erythema or cellulitic changes noted.  There is no subcutaneous fluid collection noted palpation.    A/P:  We discussed surgical planning for exchange of bilateral breast tissue expanders for bilateral breast implants today.  She feels as if she may need 1-2 more fills, I agree with this based on her preferred size.  We discussed that typically we overfill just slightly due to the expanders being more rigid than implants.  We discussed saline versus silicone implants.  We discussed the pros and cons of each.  We discussed the Mentor implant checklist consent forms today, she was provided with a copy to fill out prior to her preop for exchange.  I think that she should be ready by late November early December, we will plan to see her next week for an additional fill.  We placed injectable saline in the Expander using a sterile technique: Right: 50 cc for a total of 350 / 535 cc Left: 50 cc for a total of 350 / 535 cc

## 2022-03-07 ENCOUNTER — Ambulatory Visit (INDEPENDENT_AMBULATORY_CARE_PROVIDER_SITE_OTHER): Payer: 59 | Admitting: Surgical

## 2022-03-07 ENCOUNTER — Encounter: Payer: Self-pay | Admitting: Surgical

## 2022-03-07 DIAGNOSIS — Z1501 Genetic susceptibility to malignant neoplasm of breast: Secondary | ICD-10-CM

## 2022-03-07 DIAGNOSIS — Z1509 Genetic susceptibility to other malignant neoplasm: Secondary | ICD-10-CM

## 2022-03-13 NOTE — Progress Notes (Deleted)
31 year old female here for follow-up after bilateral breast reconstruction and placement of tissue expanders and Flex HD with Dr. Marla Roe on 01/09/2022.  At her last appointment we discussed her desired size, she would like to be about a large B/small C cup. We have discussed surgical planning and discussed the timeline of approximately late November early December if possible.      We placed injectable saline in the Expander using a sterile technique: Right: *** cc for a total of 350*** / 535 cc Left: *** cc for a total of 350*** / 535 cc

## 2022-03-14 ENCOUNTER — Encounter: Payer: 59 | Admitting: Surgical

## 2022-03-19 ENCOUNTER — Other Ambulatory Visit: Payer: Self-pay | Admitting: Family Medicine

## 2022-03-19 DIAGNOSIS — F5104 Psychophysiologic insomnia: Secondary | ICD-10-CM

## 2022-03-20 ENCOUNTER — Telehealth: Payer: Self-pay | Admitting: Family Medicine

## 2022-03-20 ENCOUNTER — Other Ambulatory Visit: Payer: Self-pay | Admitting: Family Medicine

## 2022-03-20 DIAGNOSIS — Z9013 Acquired absence of bilateral breasts and nipples: Secondary | ICD-10-CM

## 2022-03-20 DIAGNOSIS — M62838 Other muscle spasm: Secondary | ICD-10-CM

## 2022-03-20 NOTE — Telephone Encounter (Signed)
LMOVM refill request is for a controlled medication and can only be filled during a visit and will have to be addressed during your visit on 04/11/22

## 2022-03-20 NOTE — Telephone Encounter (Signed)
  Prescription Request  03/20/2022  Is this a "Controlled Substance" medicine? yes  Have you seen your PCP in the last 2 weeks? no  If YES, route message to pool  -  If NO, patient needs to be scheduled for appointment.  What is the name of the medication or equipment? Zolpidem 5 mg - Patient has appt 11-17 with DR. Darnell Level  Have you contacted your pharmacy to request a refill? YES   Which pharmacy would you like this sent to? [facility] in [place]   Patient notified that their request is being sent to the clinical staff for review and that they should receive a response within 2 business days.

## 2022-03-21 ENCOUNTER — Ambulatory Visit (INDEPENDENT_AMBULATORY_CARE_PROVIDER_SITE_OTHER): Payer: 59 | Admitting: Surgical

## 2022-03-21 DIAGNOSIS — Z1501 Genetic susceptibility to malignant neoplasm of breast: Secondary | ICD-10-CM

## 2022-03-21 DIAGNOSIS — Z1509 Genetic susceptibility to other malignant neoplasm: Secondary | ICD-10-CM

## 2022-03-21 DIAGNOSIS — Z8481 Family history of carrier of genetic disease: Secondary | ICD-10-CM

## 2022-03-21 NOTE — Progress Notes (Signed)
Patient is a 31 year old female here for follow-up after bilateral breast reconstruction with placement of tissue expanders and Flex HD with Dr. Marla Roe on 01/09/2022.  She is just over 2 months postop.  She is doing well, she is reaching her desired reconstructive size.  She tolerated her last expansion well.  She is interested and surgery late November early December for exchange.   Chaperone present on exam On exam bilateral mastectomy incisions are intact, healing well.  No erythema or cellulitic changes noted.  No subcutaneous fluid collection noted palpation.  Bilateral breasts are symmetric, she does have some superior pole volume loss greater on the left than right.  Assessment/plan:  We discussed surgical planning for exchange of bilateral breast tissue expanders for bilateral breast implants.  She would like to go with silicone implants.  She reports she read through the Mentor implant checklist consent forms at home.  She does not have any specific questions about this.  She is going to hopefully be able to talk with our surgical scheduler today to discuss scheduling surgery late November early December.  We placed injectable saline in the Expander using a sterile technique: Right: 60 cc for a total of 410 / 535 cc Left: 60 cc for a total of 410 / 535 cc  No signs of infection on exam.  Recommend following up in 2 weeks for likely her last fill.  We will plan to take pictures at her next appointment.

## 2022-03-24 ENCOUNTER — Telehealth: Payer: Self-pay | Admitting: *Deleted

## 2022-03-24 NOTE — Telephone Encounter (Signed)
Auth submitted and approved via [facility]  [id number] valid 03/24/22 - 06/22/2022

## 2022-04-11 ENCOUNTER — Encounter: Payer: Self-pay | Admitting: Family Medicine

## 2022-04-11 ENCOUNTER — Telehealth (INDEPENDENT_AMBULATORY_CARE_PROVIDER_SITE_OTHER): Payer: 59 | Admitting: Family Medicine

## 2022-04-11 ENCOUNTER — Encounter: Payer: Self-pay | Admitting: Surgical

## 2022-04-11 ENCOUNTER — Ambulatory Visit (INDEPENDENT_AMBULATORY_CARE_PROVIDER_SITE_OTHER): Payer: 59 | Admitting: Surgical

## 2022-04-11 VITALS — BP 104/70 | HR 85

## 2022-04-11 DIAGNOSIS — Z1501 Genetic susceptibility to malignant neoplasm of breast: Secondary | ICD-10-CM

## 2022-04-11 DIAGNOSIS — F320 Major depressive disorder, single episode, mild: Secondary | ICD-10-CM

## 2022-04-11 DIAGNOSIS — Z1509 Genetic susceptibility to other malignant neoplasm: Secondary | ICD-10-CM

## 2022-04-11 NOTE — Progress Notes (Signed)
Could not connect to video

## 2022-04-11 NOTE — Telephone Encounter (Signed)
Gina Alvarez, can you help her get a new visit scheduled?

## 2022-04-11 NOTE — Progress Notes (Signed)
Patient is a 31 year old female here for follow-up on her bilateral breast reconstruction after bilateral mastectomies.  She is doing well.  She tolerated her last fill well.  She is scheduled for removal of tissue expanders and placement of bilateral breast implants on 05/28/2022.  She would like an additional fill today.  She reports that she is somewhat close to the size she would like to be but would like more today.   Chaperone present on exam On exam bilateral breast incisions are intact, healing well.  No erythema or cellulitic changes noted.  No subcutaneous fluid collection noted palpation.  Bilateral breasts are symmetric   We placed injectable saline in the Expander using a sterile technique: Right: 60 cc for a total of 470 / 535 cc Left: 60 cc for a total of 470 / 535 cc  Recommend following up in 3 weeks for reevaluation and possibly additional fill prior to exchange.

## 2022-04-21 ENCOUNTER — Encounter: Payer: Self-pay | Admitting: Family Medicine

## 2022-04-21 ENCOUNTER — Ambulatory Visit (INDEPENDENT_AMBULATORY_CARE_PROVIDER_SITE_OTHER): Payer: 59 | Admitting: Family Medicine

## 2022-04-21 VITALS — BP 106/59 | HR 80 | Temp 97.3°F | Ht 59.0 in | Wt 163.4 lb

## 2022-04-21 DIAGNOSIS — Z1501 Genetic susceptibility to malignant neoplasm of breast: Secondary | ICD-10-CM

## 2022-04-21 DIAGNOSIS — N939 Abnormal uterine and vaginal bleeding, unspecified: Secondary | ICD-10-CM

## 2022-04-21 DIAGNOSIS — R5382 Chronic fatigue, unspecified: Secondary | ICD-10-CM

## 2022-04-21 DIAGNOSIS — F320 Major depressive disorder, single episode, mild: Secondary | ICD-10-CM

## 2022-04-21 DIAGNOSIS — Z1509 Genetic susceptibility to other malignant neoplasm: Secondary | ICD-10-CM

## 2022-04-21 MED ORDER — DESVENLAFAXINE SUCCINATE ER 50 MG PO TB24: 50.0000 mg | ORAL_TABLET | Freq: Every day | ORAL | 0 refills | Status: DC

## 2022-04-21 NOTE — Progress Notes (Signed)
Subjective: CC: Follow-up depression PCP: Janora Norlander, DO Gina Alvarez is a 31 y.o. female presenting to clinic today for:  1.  Depressive disorder Patient is being treated with Zoloft 150 mg daily as well as Wellbutrin SR 100 mg daily.  She really does not notice a difference with either of the medicines and would like to go ahead and transition off and onto something else.  These are the only medications that she has been treated with previously.  No SI or HI.  Sleep has been fair.  Has Ambien on hand if needed for as needed use.  Continues to be excessively tired during the daytime and yawns frequently.  She would like to have some labs checked as there is concerned that maybe she has a metabolic etiology of symptoms.  She does note that she has had menstrual cycle 3 times this month.  Has nonhormonal IUD in place.  Not sexually active.   ROS: Per HPI  No Known Allergies Past Medical History:  Diagnosis Date   Anxiety    Contraception management 08/19/2012   nexplanon inserted 08/19/12 in left arm   Depression    Irregular menstrual bleeding 09/26/2013   Nexplanon insertion 06/25/2017   06/25/17   Removed 08/25/17 d/t possible infeciton    Current Outpatient Medications:    azelastine (ASTELIN) 0.1 % nasal spray, Place 1 spray into both nostrils 2 (two) times daily., Disp: 30 mL, Rfl: 12   buPROPion ER (WELLBUTRIN SR) 100 MG 12 hr tablet, TAKE ONE TABLET BY MOUTH EVERY DAY, Disp: 90 tablet, Rfl: 0   levocetirizine (XYZAL) 5 MG tablet, Take 1 tablet (5 mg total) by mouth every evening., Disp: 90 tablet, Rfl: 3   sertraline (ZOLOFT) 100 MG tablet, Take 1.5 tablets (150 mg total) by mouth daily., Disp: 135 tablet, Rfl: 3   tiZANidine (ZANAFLEX) 4 MG tablet, Take 1 to 2 tablets (4-8 mg total) by mouth every 8 (eight) hours as needed for muscle spasms., Disp: 90 tablet, Rfl: 1   zolpidem (AMBIEN) 5 MG tablet, Take 0.5-1 tablets (2.5-5 mg total) by mouth at bedtime as  needed for sleep., Disp: 15 tablet, Rfl: 5 Social History   Socioeconomic History   Marital status: Single    Spouse name: Not on file   Number of children: Not on file   Years of education: Not on file   Highest education level: Not on file  Occupational History   Not on file  Tobacco Use   Smoking status: Former    Packs/day: 0.50    Years: 8.00    Total pack years: 4.00    Types: Cigarettes    Quit date: 12/26/2017    Years since quitting: 4.3   Smokeless tobacco: Never  Vaping Use   Vaping Use: Never used  Substance and Sexual Activity   Alcohol use: Never    Comment: maybe 1 drink per month   Drug use: Never   Sexual activity: Not Currently    Partners: Male    Birth control/protection: I.U.D.  Other Topics Concern   Not on file  Social History Narrative   Not on file   Social Determinants of Health   Financial Resource Strain: Not on file  Food Insecurity: Not on file  Transportation Needs: Not on file  Physical Activity: Not on file  Stress: Not on file  Social Connections: Not on file  Intimate Partner Violence: Not on file   Family History  Problem Relation Age of  Onset   Breast cancer Mother        dx 57-44; BRCA2+; stage III w/ chemo and radiation   Cancer Mother    Asthma Daughter    Lupus Paternal Grandmother        d. 85   Skin cancer Maternal Grandmother        "skin discoloration on arms" - not spotty   Mental illness Maternal Aunt     Objective: Office vital signs reviewed. BP (!) 106/59   Pulse 80   Temp (!) 97.3 F (36.3 C)   Ht _0  (1.499 m)   Wt 163 lb 6.4 oz (74.1 kg)   SpO2 98%   BMI 33.00 kg/m   Physical Examination:  General: Awake, alert, well nourished, No acute distress HEENT: sclera white, MMM.  No goiter.  No exophthalmos Cardio: regular rate and rhythm, S1S2 heard, no murmurs appreciated Pulm: clear to auscultation bilaterally, no wheezes, rhonchi or rales; normal work of breathing on room air Psych: Mood  stable, speech normal.  Good eye contact  Assessment/ Plan: 31 y.o. female   Chronic fatigue - Plan: TSH, T4, Free, Vitamin B12, CBC, CMP14+EGFR  Depression, major, single episode, mild (HCC) - Plan: desvenlafaxine (PRISTIQ) 50 MG 24 hr tablet, Ambulatory referral to Psychology  Abnormal uterine bleeding (AUB) - Plan: Ambulatory referral to Obstetrics / Gynecology  BRCA positive - Plan: Ambulatory referral to Obstetrics / Gynecology  Check thyroid levels, B12, CBC and CMP.  Uncertain etiology of chronic fatigue but may be secondary to uncontrolled depression.  We will be transitioning her off of the Wellbutrin and have advised her to start taking half a tablet daily for the next 2 weeks then may discontinue.  We will plan to either taper off/cross taper her SSRI with the SNRI that I prescribed today or perhaps we can just stop the SSRI and start the SNRI.  We will consult with our pharmacist and I will contact the patient with instructions.  She knows to hold off on SNRI until she is contacted  With regards to her abnormal uterine bleeding of obtain metabolic labs as above and will refer to OB/GYN.  She has history of BRCA positive so I believe that this limits the use of hormones.  However, she does have a history of bilateral mastectomy.  Would appreciate their recommendations.  Denied any intercourse or concern for STIs so this was not checked today.  No orders of the defined types were placed in this encounter.  No orders of the defined types were placed in this encounter.    Janora Norlander, DO [place] [phone]

## 2022-04-21 NOTE — Patient Instructions (Addendum)
Cut Buproprion in half and take x2 weeks. Then can stop I've sent in [place] but hold off on starting until I can talk to the pharmacist about transition from the [place].

## 2022-04-22 ENCOUNTER — Other Ambulatory Visit: Payer: Self-pay | Admitting: Family Medicine

## 2022-04-22 DIAGNOSIS — E039 Hypothyroidism, unspecified: Secondary | ICD-10-CM

## 2022-04-22 LAB — CMP14+EGFR
ALT: 9 IU/L (ref 0–32)
AST: 15 IU/L (ref 0–40)
Albumin/Globulin Ratio: 1.9 (ref 1.2–2.2)
Albumin: 4.4 g/dL (ref 3.9–4.9)
Alkaline Phosphatase: 62 IU/L (ref 44–121)
BUN/Creatinine Ratio: 13 (ref 9–23)
BUN: 7 mg/dL (ref 6–20)
Bilirubin Total: 0.2 mg/dL (ref 0.0–1.2)
CO2: 24 mmol/L (ref 20–29)
Calcium: 9.4 mg/dL (ref 8.7–10.2)
Chloride: 107 mmol/L — ABNORMAL HIGH (ref 96–106)
Creatinine, Ser: 0.56 mg/dL — ABNORMAL LOW (ref 0.57–1.00)
Globulin, Total: 2.3 g/dL (ref 1.5–4.5)
Glucose: 79 mg/dL (ref 70–99)
Potassium: 4 mmol/L (ref 3.5–5.2)
Sodium: 144 mmol/L (ref 134–144)
Total Protein: 6.7 g/dL (ref 6.0–8.5)
eGFR: 125 mL/min/{1.73_m2} (ref >59)

## 2022-04-22 LAB — CBC
Hematocrit: 36.1 % (ref 34.0–46.6)
Hemoglobin: 12.3 g/dL (ref 11.1–15.9)
MCH: 31.8 pg (ref 26.6–33.0)
MCHC: 34.1 g/dL (ref 31.5–35.7)
MCV: 93 fL (ref 79–97)
Platelets: 277 10*3/uL (ref 150–450)
RBC: 3.87 x10E6/uL (ref 3.77–5.28)
RDW: 12.9 % (ref 11.7–15.4)
WBC: 6.9 10*3/uL (ref 3.4–10.8)

## 2022-04-22 LAB — T4, FREE: Free T4: 0.73 ng/dL — ABNORMAL LOW (ref 0.82–1.77)

## 2022-04-22 LAB — TSH: TSH: 2.25 u[IU]/mL (ref 0.450–4.500)

## 2022-04-22 LAB — VITAMIN B12: Vitamin B-12: 445 pg/mL (ref 232–1245)

## 2022-04-22 MED ORDER — LEVOTHYROXINE SODIUM 50 MCG PO TABS: 50.0000 ug | ORAL_TABLET | Freq: Every day | ORAL | 0 refills | Status: DC

## 2022-05-02 ENCOUNTER — Encounter: Payer: 59 | Admitting: Surgical

## 2022-05-02 NOTE — Progress Notes (Deleted)
Patient is a 31 year old female here for follow-up on her bilateral breast reconstruction.  She has bilateral breast tissue expanders in place, currently has 470 out of 535 in each expander.  She is currently scheduled for removal of tissue expanders and placement of implants on 05/28/2022  Pictures***

## 2022-05-09 ENCOUNTER — Encounter: Payer: 59 | Admitting: Obstetrics & Gynecology

## 2022-05-16 ENCOUNTER — Ambulatory Visit (INDEPENDENT_AMBULATORY_CARE_PROVIDER_SITE_OTHER): Payer: 59 | Admitting: Surgical

## 2022-05-16 ENCOUNTER — Encounter: Payer: Self-pay | Admitting: Surgical

## 2022-05-16 ENCOUNTER — Encounter (HOSPITAL_BASED_OUTPATIENT_CLINIC_OR_DEPARTMENT_OTHER): Payer: Self-pay | Admitting: Plastic Surgery

## 2022-05-16 ENCOUNTER — Other Ambulatory Visit: Payer: Self-pay

## 2022-05-16 VITALS — BP 117/76 | HR 102 | Temp 98.3°F | Resp 18 | Ht 59.0 in | Wt 155.0 lb

## 2022-05-16 DIAGNOSIS — Z8481 Family history of carrier of genetic disease: Secondary | ICD-10-CM

## 2022-05-16 DIAGNOSIS — Z1501 Genetic susceptibility to malignant neoplasm of breast: Secondary | ICD-10-CM

## 2022-05-16 DIAGNOSIS — Z1509 Genetic susceptibility to other malignant neoplasm: Secondary | ICD-10-CM

## 2022-05-16 MED ORDER — CEPHALEXIN 500 MG PO CAPS: 500.0000 mg | ORAL_CAPSULE | Freq: Four times a day (QID) | ORAL | 0 refills | Status: AC

## 2022-05-16 MED ORDER — ONDANSETRON HCL 4 MG PO TABS: 4.0000 mg | ORAL_TABLET | Freq: Three times a day (TID) | ORAL | 0 refills | Status: DC | PRN

## 2022-05-16 MED ORDER — OXYCODONE HCL 5 MG PO TABS: 5.0000 mg | ORAL_TABLET | Freq: Four times a day (QID) | ORAL | 0 refills | Status: AC | PRN

## 2022-05-16 NOTE — Progress Notes (Signed)
Patient ID: Gina Alvarez, female    DOB: [birth year], 31 y.o.   MRN: [id number]  Chief Complaint  Patient presents with   Pre-op Exam      ICD-10-CM   1. BRCA positive  Z15.01    Z15.09     2. FHx: BRCA gene positive  Z84.81       History of Present Illness: Gina Alvarez is a 31 y.o.  female  with a history of bilateral breast reconstruction.  She presents for preoperative evaluation for upcoming procedure, removal of bilateral breast tissue expanders placement of bilateral silicone breast implants, scheduled for 05/28/2022 with Dr. Marla Roe.  The patient has not had problems with anesthesia. No history of DVT/PE.  No family history of DVT/PE.  No family or personal history of bleeding or clotting disorders.  Patient is not currently taking any blood thinners.  No history of CVA/MI.   Summary of Previous Visit: Patient currently has 470/535 in each breast tissue expander.  Patient was a D cup prior to mastectomy and would like to be a C cup after reconstruction.  She is BRCA positive.  Did not have breast cancer.  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  Provided patient with letter today  PMH Significant for: BRCA positive, bilateral mastectomies  Patient is doing well today, reports she read through the consent forms are provided with her previous visit at home.  She does not have any specific questions.  She reports that she is ready for surgery and is comfortable with the current size of her tissue expanders.  She reports she has tolerated surgery well in the past, has not had any issues or complications.  Past Medical History: Allergies: No Known Allergies  Current Medications:  Current Outpatient Medications:    cephALEXin (KEFLEX) 500 MG capsule, Take 1 capsule (500 mg total) by mouth 4 (four) times daily for 5 days., Disp: 20 capsule, Rfl: 0   desvenlafaxine (PRISTIQ) 50 MG 24 hr tablet, Take 1 tablet (50 mg total) by mouth daily., Disp: 90  tablet, Rfl: 0   levocetirizine (XYZAL) 5 MG tablet, Take 1 tablet (5 mg total) by mouth every evening., Disp: 90 tablet, Rfl: 3   levothyroxine (SYNTHROID) 50 MCG tablet, Take 1 tablet (50 mcg total) by mouth daily., Disp: 90 tablet, Rfl: 0   ondansetron (ZOFRAN) 4 MG tablet, Take 1 tablet (4 mg total) by mouth every 8 (eight) hours as needed for nausea or vomiting., Disp: 20 tablet, Rfl: 0   oxyCODONE (OXY IR/ROXICODONE) 5 MG immediate release tablet, Take 1 tablet (5 mg total) by mouth every 6 (six) hours as needed for up to 5 days for severe pain., Disp: 20 tablet, Rfl: 0   tiZANidine (ZANAFLEX) 4 MG tablet, Take 1 to 2 tablets (4-8 mg total) by mouth every 8 (eight) hours as needed for muscle spasms., Disp: 90 tablet, Rfl: 1   azelastine (ASTELIN) 0.1 % nasal spray, Place 1 spray into both nostrils 2 (two) times daily. (Patient not taking: Reported on 05/16/2022), Disp: 30 mL, Rfl: 12   zolpidem (AMBIEN) 5 MG tablet, Take 0.5-1 tablets (2.5-5 mg total) by mouth at bedtime as needed for sleep. (Patient not taking: Reported on 05/16/2022), Disp: 15 tablet, Rfl: 5  Past Medical Problems: Past Medical History:  Diagnosis Date   Anxiety    Contraception management 08/19/2012   nexplanon inserted 08/19/12 in left arm   Depression    Irregular menstrual bleeding 09/26/2013  Nexplanon insertion 06/25/2017   06/25/17   Removed 08/25/17 d/t possible infeciton    Past Surgical History: Past Surgical History:  Procedure Laterality Date   BREAST RECONSTRUCTION WITH PLACEMENT OF TISSUE EXPANDER AND FLEX HD (ACELLULAR HYDRATED DERMIS) Bilateral 01/09/2022   Procedure: BILATERAL BREAST RECONSTRUCTION WITH PLACEMENT OF TISSUE EXPANDER AND FLEX HD (ACELLULAR HYDRATED DERMIS);  Surgeon: Wallace Going, DO;  Location: [facility];  Service: Plastics;  Laterality: Bilateral;   CHOLECYSTECTOMY N/A 09/14/2020   Procedure: LAPAROSCOPIC CHOLECYSTECTOMY;  Surgeon: Virl Cagey, MD;   Location: [facility];  Service: General;  Laterality: N/A;   EXTRACORPOREAL SHOCK WAVE LITHOTRIPSY Right 06/07/2020   Procedure: EXTRACORPOREAL SHOCK WAVE LITHOTRIPSY (ESWL);  Surgeon: Ceasar Mons, MD;  Location[facility];  Service: Urology;  Laterality: Right;   I & D EXTREMITY Right 02/14/2020   Procedure: IRRIGATION AND DEBRIDEMENT RIGHT RING FINGER WITH NAILBED REPAIR;  Surgeon: Leanora Cover, MD;  Location: [facility];  Service: Orthopedics;  Laterality: Right;   NO PAST SURGERIES     PERCUTANEOUS PINNING Right 02/14/2020   Procedure: PERCUTANEOUS PINNING EXTREMITY;  Surgeon: Leanora Cover, MD;  Location: [facility];  Service: Orthopedics;  Laterality: Right;   SIMPLE MASTECTOMY WITH AXILLARY SENTINEL NODE BIOPSY Bilateral 01/09/2022   Procedure: BILATERAL TOTAL MASTECTOMY;  Surgeon: Coralie Keens, MD;  Location: [facility];  Service: General;  Laterality: Bilateral;  LMA    Social History: Social History   Socioeconomic History   Marital status: Single    Spouse name: Not on file   Number of children: Not on file   Years of education: Not on file   Highest education level: Not on file  Occupational History   Not on file  Tobacco Use   Smoking status: Former    Packs/day: 0.50    Years: 8.00    Total pack years: 4.00    Types: Cigarettes    Quit date: 12/26/2017    Years since quitting: 4.3   Smokeless tobacco: Never  Vaping Use   Vaping Use: Never used  Substance and Sexual Activity   Alcohol use: Never    Comment: maybe 1 drink per month   Drug use: Never   Sexual activity: Not Currently    Partners: Male    Birth control/protection: I.U.D.  Other Topics Concern   Not on file  Social History Narrative   Not on file   Social Determinants of Health   Financial Resource Strain: Not on file  Food Insecurity: Not on file  Transportation Needs: Not on file  Physical Activity: Not on file  Stress: Not on file  Social Connections: Not  on file  Intimate Partner Violence: Not on file    Family History: Family History  Problem Relation Age of Onset   Breast cancer Mother        dx 4-44; BRCA2+; stage III w/ chemo and radiation   Cancer Mother    Asthma Daughter    Lupus Paternal Grandmother        d. 24   Skin cancer Maternal Grandmother        "skin discoloration on arms" - not spotty   Mental illness Maternal Aunt     Review of Systems: Review of Systems  Constitutional: Negative.   Respiratory: Negative.    Cardiovascular: Negative.   Gastrointestinal: Negative.   Neurological: Negative.     Physical Exam: Vital Signs BP 117/76 (BP Location: Left Arm, Patient Position: Sitting, Cuff Size:  Normal)   Pulse (!) 102   Temp 98.3 F (36.8 C) (Oral)   Resp 18   Ht 4' 11" (1.499 m)   Wt 155 lb (70.3 kg)   LMP 05/16/2022   SpO2 99%   BMI 31.31 kg/m   Physical Exam  Constitutional:      General: Not in acute distress.    Appearance: Normal appearance. Not ill-appearing.  HENT:     Head: Normocephalic and atraumatic.  Eyes:     Pupils: Pupils are equal, round Neck:     Musculoskeletal: Normal range of motion.  Cardiovascular:     Rate and Rhythm: Normal rate    Pulses: Normal pulses.  Pulmonary:     Effort: Pulmonary effort is normal. No respiratory distress.  Abdominal:     General: Abdomen is flat. There is no distension.  Musculoskeletal: Normal range of motion.  Skin:    General: Skin is warm and dry.     Findings: No erythema or rash.  Neurological:     General: No focal deficit present.     Mental Status: Alert and oriented to person, place, and time. Mental status is at baseline.     Motor: No weakness.  Psychiatric:        Mood and Affect: Mood normal.        Behavior: Behavior normal.    Assessment/Plan: The patient is scheduled for exchange of bilateral breast tissue expanders for bilateral breast implants (silicone) with Dr. Marla Roe.  Risks, benefits, and alternatives of  procedure discussed, questions answered and consent obtained.    Smoking Status:does use vape but is not nicotine; Counseling Given?  Discussed with patient to avoid nicotine products due to increased risk of complications Last Mammogram: Status post bilateral mastectomy; Results: N/A  Caprini Score: 3, moderate; Risk Factors include: BMI greater than 25 and length of planned surgery. Recommendation for mechanical  prophylaxis. Encourage early ambulation.   Pictures obtained: Pictures were obtained of the patient and placed in the chart with the patient's or guardian's permission.  Post-op Rx sent to pharmacy: Oxycodone, Zofran, Keflex  Patient was provided with the General Surgical Risk consent document and Pain Medication Agreement prior to their appointment.  They had adequate time to read through the risk consent documents and Pain Medication Agreement. We also discussed them in person together during this preop appointment. All of their questions were answered to their satisfaction.  Recommended calling if they have any further questions.  Risk consent form and Pain Medication Agreement to be scanned into patient's chart.  Patient was provided with the [facility] implant patient decision checklist and this was completed during today's preoperative evaluation. Patient had time to read through the information and any questions were answered to their content. Form will be scanned into patient's chart.  The risks that can be encountered with and after placement of a breast implant were discussed and include the following but not limited to these: bleeding, infection, delayed healing, anesthesia risks, skin sensation changes, injury to structures including nerves, blood vessels, and muscles which may be temporary or permanent, allergies to tape, suture materials and glues, blood products, topical preparations or injected agents, skin contour irregularities, skin discoloration and swelling, deep vein  thrombosis, cardiac and pulmonary complications, pain, which may persist, fluid accumulation, wrinkling of the skin over the implanmt, changes in nipple or breast sensation, implant leakage or rupture, faulty position of the implant, persistent pain, formation of tight scar tissue around the implant (capsular contracture).  Electronically signed by: Carola Rhine Tannie Koskela, PA-C 05/16/2022 10:35 AM

## 2022-05-16 NOTE — H&P (View-Only) (Signed)
Patient ID: Gina Alvarez, female    DOB: 06/03/1990, 31 y.o.   MRN: [id number]  Chief Complaint  Patient presents with   Pre-op Exam      ICD-10-CM   1. BRCA positive  Z15.01    Z15.09     2. FHx: BRCA gene positive  Z84.81       History of Present Illness: Gina Alvarez is a 31 y.o.  female  with a history of bilateral breast reconstruction.  She presents for preoperative evaluation for upcoming procedure, removal of bilateral breast tissue expanders placement of bilateral silicone breast implants, scheduled for 05/28/2022 with Dr. Marla Roe.  The patient has not had problems with anesthesia. No history of DVT/PE.  No family history of DVT/PE.  No family or personal history of bleeding or clotting disorders.  Patient is not currently taking any blood thinners.  No history of CVA/MI.   Summary of Previous Visit: Patient currently has 470/535 in each breast tissue expander.  Patient was a D cup prior to mastectomy and would like to be a C cup after reconstruction.  She is BRCA positive.  Did not have breast cancer.  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  Provided patient with letter today  PMH Significant for: BRCA positive, bilateral mastectomies  Patient is doing well today, reports she read through the consent forms are provided with her previous visit at home.  She does not have any specific questions.  She reports that she is ready for surgery and is comfortable with the current size of her tissue expanders.  She reports she has tolerated surgery well in the past, has not had any issues or complications.  Past Medical History: Allergies: No Known Allergies  Current Medications:  Current Outpatient Medications:    cephALEXin (KEFLEX) 500 MG capsule, Take 1 capsule (500 mg total) by mouth 4 (four) times daily for 5 days., Disp: 20 capsule, Rfl: 0   desvenlafaxine (PRISTIQ) 50 MG 24 hr tablet, Take 1 tablet (50 mg total) by mouth daily., Disp: 90  tablet, Rfl: 0   levocetirizine (XYZAL) 5 MG tablet, Take 1 tablet (5 mg total) by mouth every evening., Disp: 90 tablet, Rfl: 3   levothyroxine (SYNTHROID) 50 MCG tablet, Take 1 tablet (50 mcg total) by mouth daily., Disp: 90 tablet, Rfl: 0   ondansetron (ZOFRAN) 4 MG tablet, Take 1 tablet (4 mg total) by mouth every 8 (eight) hours as needed for nausea or vomiting., Disp: 20 tablet, Rfl: 0   oxyCODONE (OXY IR/ROXICODONE) 5 MG immediate release tablet, Take 1 tablet (5 mg total) by mouth every 6 (six) hours as needed for up to 5 days for severe pain., Disp: 20 tablet, Rfl: 0   tiZANidine (ZANAFLEX) 4 MG tablet, Take 1 to 2 tablets (4-8 mg total) by mouth every 8 (eight) hours as needed for muscle spasms., Disp: 90 tablet, Rfl: 1   azelastine (ASTELIN) 0.1 % nasal spray, Place 1 spray into both nostrils 2 (two) times daily. (Patient not taking: Reported on 05/16/2022), Disp: 30 mL, Rfl: 12   zolpidem (AMBIEN) 5 MG tablet, Take 0.5-1 tablets (2.5-5 mg total) by mouth at bedtime as needed for sleep. (Patient not taking: Reported on 05/16/2022), Disp: 15 tablet, Rfl: 5  Past Medical Problems: Past Medical History:  Diagnosis Date   Anxiety    Contraception management 08/19/2012   nexplanon inserted 08/19/12 in left arm   Depression    Irregular menstrual bleeding 09/26/2013  Nexplanon insertion 06/25/2017   06/25/17   Removed 08/25/17 d/t possible infeciton    Past Surgical History: Past Surgical History:  Procedure Laterality Date   BREAST RECONSTRUCTION WITH PLACEMENT OF TISSUE EXPANDER AND FLEX HD (ACELLULAR HYDRATED DERMIS) Bilateral 01/09/2022   Procedure: BILATERAL BREAST RECONSTRUCTION WITH PLACEMENT OF TISSUE EXPANDER AND FLEX HD (ACELLULAR HYDRATED DERMIS);  Surgeon: Wallace Going, DO;  Location: [place];  Service: Plastics;  Laterality: Bilateral;   CHOLECYSTECTOMY N/A 09/14/2020   Procedure: LAPAROSCOPIC CHOLECYSTECTOMY;  Surgeon: Virl Cagey, MD;   Location: [facility];  Service: General;  Laterality: N/A;   EXTRACORPOREAL SHOCK WAVE LITHOTRIPSY Right 06/07/2020   Procedure: EXTRACORPOREAL SHOCK WAVE LITHOTRIPSY (ESWL);  Surgeon: Ceasar Mons, MD;  Location: [facility];  Service: Urology;  Laterality: Right;   I & D EXTREMITY Right 02/14/2020   Procedure: IRRIGATION AND DEBRIDEMENT RIGHT RING FINGER WITH NAILBED REPAIR;  Surgeon: Leanora Cover, MD;  Location: [place];  Service: Orthopedics;  Laterality: Right;   NO PAST SURGERIES     PERCUTANEOUS PINNING Right 02/14/2020   Procedure: PERCUTANEOUS PINNING EXTREMITY;  Surgeon: Leanora Cover, MD;  Location: [place];  Service: Orthopedics;  Laterality: Right;   SIMPLE MASTECTOMY WITH AXILLARY SENTINEL NODE BIOPSY Bilateral 01/09/2022   Procedure: BILATERAL TOTAL MASTECTOMY;  Surgeon: Coralie Keens, MD;  Location: [place];  Service: General;  Laterality: Bilateral;  LMA    Social History: Social History   Socioeconomic History   Marital status: Single    Spouse name: Not on file   Number of children: Not on file   Years of education: Not on file   Highest education level: Not on file  Occupational History   Not on file  Tobacco Use   Smoking status: Former    Packs/day: 0.50    Years: 8.00    Total pack years: 4.00    Types: Cigarettes    Quit date: 12/26/2017    Years since quitting: 4.3   Smokeless tobacco: Never  Vaping Use   Vaping Use: Never used  Substance and Sexual Activity   Alcohol use: Never    Comment: maybe 1 drink per month   Drug use: Never   Sexual activity: Not Currently    Partners: Male    Birth control/protection: I.U.D.  Other Topics Concern   Not on file  Social History Narrative   Not on file   Social Determinants of Health   Financial Resource Strain: Not on file  Food Insecurity: Not on file  Transportation Needs: Not on file  Physical Activity: Not on file  Stress: Not on file  Social Connections: Not  on file  Intimate Partner Violence: Not on file    Family History: Family History  Problem Relation Age of Onset   Breast cancer Mother        dx 62-44; BRCA2+; stage III w/ chemo and radiation   Cancer Mother    Asthma Daughter    Lupus Paternal Grandmother        d. 34   Skin cancer Maternal Grandmother        "skin discoloration on arms" - not spotty   Mental illness Maternal Aunt     Review of Systems: Review of Systems  Constitutional: Negative.   Respiratory: Negative.    Cardiovascular: Negative.   Gastrointestinal: Negative.   Neurological: Negative.     Physical Exam: Vital Signs BP 117/76 (BP Location: Left Arm, Patient Position: Sitting, Cuff Size:  Normal)   Pulse (!) 102   Temp 98.3 F (36.8 C) (Oral)   Resp 18   Ht 4' 11" (1.499 m)   Wt 155 lb (70.3 kg)   LMP 05/16/2022   SpO2 99%   BMI 31.31 kg/m   Physical Exam  Constitutional:      General: Not in acute distress.    Appearance: Normal appearance. Not ill-appearing.  HENT:     Head: Normocephalic and atraumatic.  Eyes:     Pupils: Pupils are equal, round Neck:     Musculoskeletal: Normal range of motion.  Cardiovascular:     Rate and Rhythm: Normal rate    Pulses: Normal pulses.  Pulmonary:     Effort: Pulmonary effort is normal. No respiratory distress.  Abdominal:     General: Abdomen is flat. There is no distension.  Musculoskeletal: Normal range of motion.  Skin:    General: Skin is warm and dry.     Findings: No erythema or rash.  Neurological:     General: No focal deficit present.     Mental Status: Alert and oriented to person, place, and time. Mental status is at baseline.     Motor: No weakness.  Psychiatric:        Mood and Affect: Mood normal.        Behavior: Behavior normal.    Assessment/Plan: The patient is scheduled for exchange of bilateral breast tissue expanders for bilateral breast implants (silicone) with Dr. Marla Roe.  Risks, benefits, and alternatives of  procedure discussed, questions answered and consent obtained.    Smoking Status:does use vape but is not nicotine; Counseling Given?  Discussed with patient to avoid nicotine products due to increased risk of complications Last Mammogram: Status post bilateral mastectomy; Results: N/A  Caprini Score: 3, moderate; Risk Factors include: BMI greater than 25 and length of planned surgery. Recommendation for mechanical  prophylaxis. Encourage early ambulation.   Pictures obtained: Pictures were obtained of the patient and placed in the chart with the patient's or guardian's permission.  Post-op Rx sent to pharmacy: Oxycodone, Zofran, Keflex  Patient was provided with the General Surgical Risk consent document and Pain Medication Agreement prior to their appointment.  They had adequate time to read through the risk consent documents and Pain Medication Agreement. We also discussed them in person together during this preop appointment. All of their questions were answered to their satisfaction.  Recommended calling if they have any further questions.  Risk consent form and Pain Medication Agreement to be scanned into patient's chart.  Patient was provided with the Mentor implant patient decision checklist and this was completed during today's preoperative evaluation. Patient had time to read through the information and any questions were answered to their content. Form will be scanned into patient's chart.  The risks that can be encountered with and after placement of a breast implant were discussed and include the following but not limited to these: bleeding, infection, delayed healing, anesthesia risks, skin sensation changes, injury to structures including nerves, blood vessels, and muscles which may be temporary or permanent, allergies to tape, suture materials and glues, blood products, topical preparations or injected agents, skin contour irregularities, skin discoloration and swelling, deep vein  thrombosis, cardiac and pulmonary complications, pain, which may persist, fluid accumulation, wrinkling of the skin over the implanmt, changes in nipple or breast sensation, implant leakage or rupture, faulty position of the implant, persistent pain, formation of tight scar tissue around the implant (capsular contracture).  Electronically signed by: Carola Rhine Scheeler, PA-C 05/16/2022 10:35 AM

## 2022-05-27 ENCOUNTER — Other Ambulatory Visit: Payer: Self-pay | Admitting: Family Medicine

## 2022-05-27 DIAGNOSIS — Z9013 Acquired absence of bilateral breasts and nipples: Secondary | ICD-10-CM

## 2022-05-27 DIAGNOSIS — M62838 Other muscle spasm: Secondary | ICD-10-CM

## 2022-05-27 NOTE — Anesthesia Preprocedure Evaluation (Signed)
Anesthesia Evaluation  Patient identified by MRN, date of birth, ID band Patient awake    Reviewed: Allergy & Precautions, NPO status , Patient's Chart, lab work & pertinent test results  Airway Mallampati: II  TM Distance: >3 FB Neck ROM: Full    Dental  (+) Teeth Intact, Dental Advisory Given, Chipped,    Pulmonary former smoker   Pulmonary exam normal breath sounds clear to auscultation       Cardiovascular Exercise Tolerance: Good negative cardio ROS Normal cardiovascular exam Rhythm:Regular Rate:Normal     Neuro/Psych  PSYCHIATRIC DISORDERS Anxiety Depression    negative neurological ROS     GI/Hepatic negative GI ROS, Neg liver ROS,,,  Endo/Other  Hypothyroidism    Renal/GU negative Renal ROS     Musculoskeletal negative musculoskeletal ROS (+)    Abdominal   Peds  Hematology negative hematology ROS (+)   Anesthesia Other Findings S/p BREAST RECONSTRUCTION WITH PLACEMENT OF TISSUE EXPANDER AND FLEX HD  Reproductive/Obstetrics                             Anesthesia Physical Anesthesia Plan  ASA: 2  Anesthesia Plan: General   Post-op Pain Management: Tylenol PO (pre-op)* and Gabapentin PO (pre-op)*   Induction: Intravenous  PONV Risk Score and Plan: 3 and Midazolam, Dexamethasone and Ondansetron  Airway Management Planned: LMA  Additional Equipment:   Intra-op Plan:   Post-operative Plan: Extubation in OR  Informed Consent: I have reviewed the patients History and Physical, chart, labs and discussed the procedure including the risks, benefits and alternatives for the proposed anesthesia with the patient or authorized representative who has indicated his/her understanding and acceptance.     Dental advisory given  Plan Discussed with: CRNA  Anesthesia Plan Comments:         Anesthesia Quick Evaluation

## 2022-05-28 ENCOUNTER — Ambulatory Visit (HOSPITAL_BASED_OUTPATIENT_CLINIC_OR_DEPARTMENT_OTHER): Payer: 59 | Admitting: Anesthesiology

## 2022-05-28 ENCOUNTER — Encounter (HOSPITAL_BASED_OUTPATIENT_CLINIC_OR_DEPARTMENT_OTHER): Payer: Self-pay | Admitting: Plastic Surgery

## 2022-05-28 ENCOUNTER — Other Ambulatory Visit: Payer: Self-pay

## 2022-05-28 ENCOUNTER — Ambulatory Visit (HOSPITAL_BASED_OUTPATIENT_CLINIC_OR_DEPARTMENT_OTHER)
Admission: RE | Admit: 2022-05-28 | Discharge: 2022-05-28 | Disposition: A | Discharge status: Home / Self Care | Payer: 59 | Attending: Plastic Surgery | Admitting: Plastic Surgery

## 2022-05-28 ENCOUNTER — Encounter (HOSPITAL_BASED_OUTPATIENT_CLINIC_OR_DEPARTMENT_OTHER)
Admission: RE | Disposition: A | Discharge status: Home / Self Care | Payer: Self-pay | Source: Home / Self Care | Attending: Plastic Surgery

## 2022-05-28 DIAGNOSIS — Z1501 Genetic susceptibility to malignant neoplasm of breast: Secondary | ICD-10-CM | POA: Diagnosis not present

## 2022-05-28 DIAGNOSIS — Z87891 Personal history of nicotine dependence: Secondary | ICD-10-CM

## 2022-05-28 DIAGNOSIS — F32A Depression, unspecified: Secondary | ICD-10-CM | POA: Diagnosis not present

## 2022-05-28 DIAGNOSIS — Z8481 Family history of carrier of genetic disease: Secondary | ICD-10-CM | POA: Insufficient documentation

## 2022-05-28 DIAGNOSIS — E039 Hypothyroidism, unspecified: Secondary | ICD-10-CM | POA: Diagnosis not present

## 2022-05-28 DIAGNOSIS — Z421 Encounter for breast reconstruction following mastectomy: Secondary | ICD-10-CM

## 2022-05-28 DIAGNOSIS — F419 Anxiety disorder, unspecified: Secondary | ICD-10-CM | POA: Insufficient documentation

## 2022-05-28 DIAGNOSIS — Z9013 Acquired absence of bilateral breasts and nipples: Secondary | ICD-10-CM | POA: Insufficient documentation

## 2022-05-28 DIAGNOSIS — Z01818 Encounter for other preprocedural examination: Secondary | ICD-10-CM

## 2022-05-28 DIAGNOSIS — Z853 Personal history of malignant neoplasm of breast: Secondary | ICD-10-CM | POA: Diagnosis not present

## 2022-05-28 HISTORY — PX: REMOVAL OF TISSUE EXPANDER AND PLACEMENT OF IMPLANT: SHX6457

## 2022-05-28 LAB — POCT PREGNANCY, URINE: Preg Test, Ur: NEGATIVE

## 2022-05-28 SURGERY — REMOVAL, TISSUE EXPANDER, BREAST, WITH IMPLANT INSERTION
Anesthesia: General | Site: Breast | Laterality: Bilateral

## 2022-05-28 MED ORDER — SCOPOLAMINE 1 MG/3DAYS TD PT72
MEDICATED_PATCH | TRANSDERMAL | Status: AC
  Filled 2022-05-28: qty 1

## 2022-05-28 MED ORDER — LIDOCAINE-EPINEPHRINE 1 %-1:100000 IJ SOLN
INTRAMUSCULAR | Status: AC
  Filled 2022-05-28: qty 1

## 2022-05-28 MED ORDER — PHENYLEPHRINE HCL (PRESSORS) 10 MG/ML IV SOLN
INTRAVENOUS | Status: DC | PRN
  Administered 2022-05-28 (×2): 40 ug via INTRAVENOUS

## 2022-05-28 MED ORDER — OXYCODONE HCL 5 MG PO TABS
5.0000 mg | ORAL_TABLET | ORAL | Status: DC | PRN
  Administered 2022-05-28: 10 mg via ORAL

## 2022-05-28 MED ORDER — FENTANYL CITRATE (PF) 100 MCG/2ML IJ SOLN: 25.0000 ug | INTRAMUSCULAR | Status: DC | PRN

## 2022-05-28 MED ORDER — ACETAMINOPHEN 500 MG PO TABS
ORAL_TABLET | ORAL | Status: AC
  Filled 2022-05-28: qty 2

## 2022-05-28 MED ORDER — DEXAMETHASONE SODIUM PHOSPHATE 4 MG/ML IJ SOLN
INTRAMUSCULAR | Status: DC | PRN
  Administered 2022-05-28: 8 mg via INTRAVENOUS

## 2022-05-28 MED ORDER — OXYCODONE HCL 5 MG PO TABS
ORAL_TABLET | ORAL | Status: AC
  Filled 2022-05-28: qty 2

## 2022-05-28 MED ORDER — SCOPOLAMINE 1 MG/3DAYS TD PT72
1.0000 | MEDICATED_PATCH | Freq: Once | TRANSDERMAL | Status: DC
  Administered 2022-05-28: 1.5 mg via TRANSDERMAL

## 2022-05-28 MED ORDER — ACETAMINOPHEN 325 MG PO TABS: 650.0000 mg | ORAL_TABLET | ORAL | Status: DC | PRN

## 2022-05-28 MED ORDER — LIDOCAINE HCL (CARDIAC) PF 100 MG/5ML IV SOSY
PREFILLED_SYRINGE | INTRAVENOUS | Status: DC | PRN
  Administered 2022-05-28: 100 mg via INTRAVENOUS

## 2022-05-28 MED ORDER — EPHEDRINE SULFATE (PRESSORS) 50 MG/ML IJ SOLN
INTRAMUSCULAR | Status: DC | PRN
  Administered 2022-05-28: 15 mg via INTRAVENOUS

## 2022-05-28 MED ORDER — FENTANYL CITRATE (PF) 250 MCG/5ML IJ SOLN
INTRAMUSCULAR | Status: DC | PRN
  Administered 2022-05-28 (×2): 50 ug via INTRAVENOUS

## 2022-05-28 MED ORDER — LACTATED RINGERS IV SOLN: INTRAVENOUS | Status: DC

## 2022-05-28 MED ORDER — SODIUM CHLORIDE 0.9 % IV SOLN
INTRAVENOUS | Status: DC | PRN
  Administered 2022-05-28: 500 mL

## 2022-05-28 MED ORDER — SUCCINYLCHOLINE CHLORIDE 200 MG/10ML IV SOSY
PREFILLED_SYRINGE | INTRAVENOUS | Status: AC
  Filled 2022-05-28: qty 10

## 2022-05-28 MED ORDER — LIDOCAINE-EPINEPHRINE 1 %-1:100000 IJ SOLN
INTRAMUSCULAR | Status: DC | PRN
  Administered 2022-05-28: 10 mL

## 2022-05-28 MED ORDER — EPINEPHRINE PF 1 MG/ML IJ SOLN
INTRAMUSCULAR | Status: AC
  Filled 2022-05-28: qty 1

## 2022-05-28 MED ORDER — ATROPINE SULFATE 0.4 MG/ML IV SOLN
INTRAVENOUS | Status: AC
  Filled 2022-05-28: qty 1

## 2022-05-28 MED ORDER — FENTANYL CITRATE (PF) 100 MCG/2ML IJ SOLN
INTRAMUSCULAR | Status: AC
  Filled 2022-05-28: qty 2

## 2022-05-28 MED ORDER — GABAPENTIN 300 MG PO CAPS
300.0000 mg | ORAL_CAPSULE | Freq: Once | ORAL | Status: AC
  Administered 2022-05-28: 300 mg via ORAL

## 2022-05-28 MED ORDER — ONDANSETRON HCL 4 MG/2ML IJ SOLN
INTRAMUSCULAR | Status: DC | PRN
  Administered 2022-05-28: 4 mg via INTRAVENOUS

## 2022-05-28 MED ORDER — PHENYLEPHRINE HCL (PRESSORS) 10 MG/ML IV SOLN
INTRAVENOUS | Status: AC
  Filled 2022-05-28: qty 1

## 2022-05-28 MED ORDER — CEFAZOLIN SODIUM-DEXTROSE 2-4 GM/100ML-% IV SOLN
INTRAVENOUS | Status: AC
  Filled 2022-05-28: qty 100

## 2022-05-28 MED ORDER — GABAPENTIN 300 MG PO CAPS
ORAL_CAPSULE | ORAL | Status: AC
  Filled 2022-05-28: qty 1

## 2022-05-28 MED ORDER — ACETAMINOPHEN 500 MG PO TABS
1000.0000 mg | ORAL_TABLET | Freq: Once | ORAL | Status: AC
  Administered 2022-05-28: 1000 mg via ORAL

## 2022-05-28 MED ORDER — PROMETHAZINE HCL 25 MG/ML IJ SOLN: 6.2500 mg | INTRAMUSCULAR | Status: DC | PRN

## 2022-05-28 MED ORDER — MIDAZOLAM HCL 2 MG/2ML IJ SOLN
INTRAMUSCULAR | Status: AC
  Filled 2022-05-28: qty 2

## 2022-05-28 MED ORDER — PHENYLEPHRINE HCL-NACL 20-0.9 MG/250ML-% IV SOLN
INTRAVENOUS | Status: DC | PRN
  Administered 2022-05-28: 40 ug/min via INTRAVENOUS

## 2022-05-28 MED ORDER — BUPIVACAINE HCL (PF) 0.25 % IJ SOLN
INTRAMUSCULAR | Status: AC
  Filled 2022-05-28: qty 30

## 2022-05-28 MED ORDER — CHLORHEXIDINE GLUCONATE CLOTH 2 % EX PADS: 6.0000 | MEDICATED_PAD | Freq: Once | CUTANEOUS | Status: DC

## 2022-05-28 MED ORDER — CEFAZOLIN SODIUM-DEXTROSE 2-4 GM/100ML-% IV SOLN
2.0000 g | INTRAVENOUS | Status: AC
  Administered 2022-05-28: 2 g via INTRAVENOUS

## 2022-05-28 MED ORDER — ONDANSETRON HCL 4 MG/2ML IJ SOLN
INTRAMUSCULAR | Status: AC
  Filled 2022-05-28: qty 2

## 2022-05-28 MED ORDER — PROPOFOL 10 MG/ML IV BOLUS
INTRAVENOUS | Status: DC | PRN
  Administered 2022-05-28: 150 mg via INTRAVENOUS

## 2022-05-28 MED ORDER — ACETAMINOPHEN 325 MG RE SUPP: 650.0000 mg | RECTAL | Status: DC | PRN

## 2022-05-28 MED ORDER — SODIUM CHLORIDE 0.9% FLUSH: 3.0000 mL | INTRAVENOUS | Status: DC | PRN

## 2022-05-28 MED ORDER — PHENYLEPHRINE 80 MCG/ML (10ML) SYRINGE FOR IV PUSH (FOR BLOOD PRESSURE SUPPORT)
PREFILLED_SYRINGE | INTRAVENOUS | Status: AC
  Filled 2022-05-28: qty 10

## 2022-05-28 MED ORDER — SODIUM CHLORIDE 0.9% FLUSH: 3.0000 mL | Freq: Two times a day (BID) | INTRAVENOUS | Status: DC

## 2022-05-28 MED ORDER — SODIUM CHLORIDE 0.9 % IV SOLN: 250.0000 mL | INTRAVENOUS | Status: DC | PRN

## 2022-05-28 MED ORDER — DEXAMETHASONE SODIUM PHOSPHATE 10 MG/ML IJ SOLN
INTRAMUSCULAR | Status: AC
  Filled 2022-05-28: qty 1

## 2022-05-28 MED ORDER — MIDAZOLAM HCL 5 MG/5ML IJ SOLN
INTRAMUSCULAR | Status: DC | PRN
  Administered 2022-05-28: 2 mg via INTRAVENOUS

## 2022-05-28 MED ORDER — LIDOCAINE 2% (20 MG/ML) 5 ML SYRINGE
INTRAMUSCULAR | Status: AC
  Filled 2022-05-28: qty 5

## 2022-05-28 MED ORDER — EPHEDRINE 5 MG/ML INJ
INTRAVENOUS | Status: AC
  Filled 2022-05-28: qty 5

## 2022-05-28 MED ORDER — SODIUM CHLORIDE 0.9 % IV SOLN
INTRAVENOUS | Status: AC
  Filled 2022-05-28: qty 10

## 2022-05-28 MED ORDER — LIDOCAINE HCL (PF) 1 % IJ SOLN
INTRAMUSCULAR | Status: AC
  Filled 2022-05-28: qty 30

## 2022-05-28 SURGICAL SUPPLY — 69 items
ADH SKN CLS APL DERMABOND .7 (GAUZE/BANDAGES/DRESSINGS) ×2
BAG DECANTER FOR FLEXI CONT (MISCELLANEOUS) ×2 IMPLANT
BINDER BREAST LRG (GAUZE/BANDAGES/DRESSINGS) IMPLANT
BINDER BREAST MEDIUM (GAUZE/BANDAGES/DRESSINGS) IMPLANT
BINDER BREAST XLRG (GAUZE/BANDAGES/DRESSINGS) IMPLANT
BINDER BREAST XXLRG (GAUZE/BANDAGES/DRESSINGS) IMPLANT
BIOPATCH RED 1 DISK 7.0 (GAUZE/BANDAGES/DRESSINGS) IMPLANT
BLADE SURG 10 STRL SS (BLADE) IMPLANT
BLADE SURG 15 STRL LF DISP TIS (BLADE) ×2 IMPLANT
BLADE SURG 15 STRL SS (BLADE) ×1
CANISTER SUCT 1200ML W/VALVE (MISCELLANEOUS) ×2 IMPLANT
COVER BACK TABLE 60X90IN (DRAPES) ×2 IMPLANT
COVER MAYO STAND STRL (DRAPES) ×2 IMPLANT
DERMABOND ADVANCED .7 DNX12 (GAUZE/BANDAGES/DRESSINGS) IMPLANT
DRAIN CHANNEL 19F RND (DRAIN) IMPLANT
DRAPE LAPAROSCOPIC ABDOMINAL (DRAPES) ×2 IMPLANT
DRSG MEPILEX POST OP 4X8 (GAUZE/BANDAGES/DRESSINGS) ×4 IMPLANT
ELECT BLADE 4.0 EZ CLEAN MEGAD (MISCELLANEOUS) ×1
ELECT COATED BLADE 2.86 ST (ELECTRODE) ×2 IMPLANT
ELECT REM PT RETURN 9FT ADLT (ELECTROSURGICAL) ×1
ELECTRODE BLDE 4.0 EZ CLN MEGD (MISCELLANEOUS) ×2 IMPLANT
ELECTRODE REM PT RTRN 9FT ADLT (ELECTROSURGICAL) ×2 IMPLANT
EVACUATOR SILICONE 100CC (DRAIN) IMPLANT
FUNNEL KELLER 2 DISP (MISCELLANEOUS) IMPLANT
GAUZE PAD ABD 8X10 STRL (GAUZE/BANDAGES/DRESSINGS) ×4 IMPLANT
GAUZE SPONGE 4X4 12PLY STRL LF (GAUZE/BANDAGES/DRESSINGS) IMPLANT
GLOVE BIO SURGEON STRL SZ 6.5 (GLOVE) ×4 IMPLANT
GLOVE BIO SURGEON STRL SZ7.5 (GLOVE) ×2 IMPLANT
GOWN STRL REUS W/ TWL LRG LVL3 (GOWN DISPOSABLE) ×6 IMPLANT
GOWN STRL REUS W/ TWL XL LVL3 (GOWN DISPOSABLE) IMPLANT
GOWN STRL REUS W/TWL LRG LVL3 (GOWN DISPOSABLE) ×3
GOWN STRL REUS W/TWL XL LVL3 (GOWN DISPOSABLE) ×1
IMPL GEL HP 590CC (Breast) IMPLANT
IMPLANT GEL HP 590CC (Breast) ×2 IMPLANT
IV NS 1000ML (IV SOLUTION)
IV NS 1000ML BAXH (IV SOLUTION) IMPLANT
NDL HYPO 25X1 1.5 SAFETY (NEEDLE) ×2 IMPLANT
NDL SAFETY ECLIP 18X1.5 (MISCELLANEOUS) ×2 IMPLANT
NEEDLE HYPO 25X1 1.5 SAFETY (NEEDLE) ×1 IMPLANT
PACK BASIN DAY SURGERY FS (CUSTOM PROCEDURE TRAY) ×2 IMPLANT
PENCIL SMOKE EVACUATOR (MISCELLANEOUS) ×2 IMPLANT
PIN SAFETY STERILE (MISCELLANEOUS) IMPLANT
SIZER BREAST REUSE 535CC (SIZER) ×1
SIZER BREAST REUSE 590CC (SIZER) ×1
SIZER BRST REUSE 590CC (SIZER) IMPLANT
SIZER BRST REUSE ULT HI 535CC (SIZER) IMPLANT
SLEEVE SCD COMPRESS KNEE MED (STOCKING) ×2 IMPLANT
SPIKE FLUID TRANSFER (MISCELLANEOUS) IMPLANT
SPONGE T-LAP 18X18 ~~LOC~~+RFID (SPONGE) ×4 IMPLANT
STRIP SUTURE WOUND CLOSURE 1/2 (MISCELLANEOUS) IMPLANT
SUT MNCRL AB 3-0 PS2 18 (SUTURE) IMPLANT
SUT MNCRL AB 4-0 PS2 18 (SUTURE) IMPLANT
SUT MON AB 3-0 SH 27 (SUTURE)
SUT MON AB 3-0 SH27 (SUTURE) IMPLANT
SUT MON AB 5-0 PS2 18 (SUTURE) IMPLANT
SUT PDS 3-0 CT2 (SUTURE)
SUT PDS AB 2-0 CT2 27 (SUTURE) IMPLANT
SUT PDS II 3-0 CT2 27 ABS (SUTURE) IMPLANT
SUT SILK 3 0 PS 1 (SUTURE) IMPLANT
SUT VIC AB 3-0 SH 27 (SUTURE)
SUT VIC AB 3-0 SH 27X BRD (SUTURE) IMPLANT
SUT VICRYL 4-0 PS2 18IN ABS (SUTURE) IMPLANT
SYR BULB IRRIG 60ML STRL (SYRINGE) ×2 IMPLANT
SYR CONTROL 10ML LL (SYRINGE) ×2 IMPLANT
TOWEL GREEN STERILE FF (TOWEL DISPOSABLE) ×4 IMPLANT
TRAY DSU PREP LF (CUSTOM PROCEDURE TRAY) ×2 IMPLANT
TUBE CONNECTING 20X1/4 (TUBING) ×2 IMPLANT
UNDERPAD 30X36 HEAVY ABSORB (UNDERPADS AND DIAPERS) ×4 IMPLANT
YANKAUER SUCT BULB TIP NO VENT (SUCTIONS) ×2 IMPLANT

## 2022-05-28 NOTE — Op Note (Signed)
Op report Bilateral Exchange   DATE OF OPERATION: 05/28/2022  LOCATION: [place]  SURGICAL DIVISION: Plastic Surgery  PREOPERATIVE DIAGNOSIS:  1.History of BRCA positive gene.  2. Acquired absence of bilateral breast.   POSTOPERATIVE DIAGNOSIS:  Same as preoperative diagnosis.   PROCEDURE:  1. Bilateral exchange of tissue expanders for implants.  2. Bilateral capsulotomies for implant respositioning.  SURGEON: Claire Sanger Dillingham, DO  ASSISTANT: Roetta Sessions, PA  ANESTHESIA:  General.   COMPLICATIONS: None.   IMPLANTS: Left - Mentor Smooth Round Ultra High Profile Gel 590 cc. Ref #[id number].  Serial Number [id number] Right - Mentor Smooth Round Ultra High Profile Gel 590 cc. Ref #[id number].  Serial Number [id number]  INDICATIONS FOR PROCEDURE:  The patient, Gina Alvarez, is a 32 y.o. female born on Oct 20, 1990, is here for treatment after bilateral mastectomies.  She had tissue expanders placed at the time of mastectomies. She now presents for exchange of her expanders for implants.  She requires capsulotomies to better position the implants. MRN: [id number]  CONSENT:  Informed consent was obtained directly from the patient. Risks, benefits and alternatives were fully discussed. Specific risks including but not limited to bleeding, infection, hematoma, seroma, scarring, pain, implant infection, implant extrusion, capsular contracture, asymmetry, wound healing problems, and need for further surgery were all discussed. The patient did have an ample opportunity to have her questions answered to her satisfaction.   DESCRIPTION OF PROCEDURE:  The patient was taken to the operating room. SCDs were placed and IV antibiotics were given. The patient's chest was prepped and draped in a sterile fashion. A time out was performed and the implants to be used were identified.    On the right breast: Local with epinephrine was used to infiltrate at the  incision site. The old mastectomy scar was incised.  The mastectomy flaps from the superior and inferior flaps were raised over the pectoralis major muscle for several centimeters to minimize tension for the closure. The pectoralis was split inferior to the skin incision to expose and remove the tissue expander.  Inspection of the pocket showed a normal healthy capsule and good integration of the biologic matrix.  The pocket was irrigated with antibiotic solution.  Circumferential capsulotomies were performed to allow for breast pocket expansion.  Measurements were made and a sizer used to confirm adequate pocket size for the implant dimensions.  Hemostasis was ensured with electrocautery. New gloves were placed. The implant was soaked in antibiotic solution and then placed in the pocket and oriented appropriately. The pectoralis major muscle and capsule on the anterior surface were re-closed with a 3-0 PDS suture. The remaining skin was closed with 3-0 Monocryl deep dermal and 4-0 Monocryl subcuticular stitches.   On the left breast: The old mastectomy scar was excised.  The mastectomy flaps from the superior and inferior flaps were raised over the pectoralis major muscle for several centimeters to minimize tension for the closure. The pectoralis was split inferior to the skin incision to expose and remove the tissue expander.  Inspection of the pocket showed a normal healthy capsule and good integration of the biologic matrix.   Circumferential capsulotomies were performed to allow for breast pocket expansion.  Measurements were made and a sizer utilized to confirm adequate pocket size for the implant dimensions.  Hemostasis was ensured with the electrocautery.  New gloves were applied. The implant was soaked in antibiotic solution and placed in the pocket and oriented appropriately. The pectoralis major muscle and capsule on  the anterior surface were re-closed with a 3-0 PDS suture. The remaining skin was  closed with 3-0 Monocryl deep dermal and 4-0 Monocryl subcuticular stitches.  Dermabond was applied to the incision site. A breast binder and ABDs were placed.  The patient was allowed to wake from anesthesia and taken to the recovery room in satisfactory condition.   The advanced practice practitioner (APP) assisted throughout the case.  The APP was essential in retraction and counter traction when needed to make the case progress smoothly.  This retraction and assistance made it possible to see the tissue plans for the procedure.  The assistance was needed for blood control, tissue re-approximation and assisted with closure of the incision site.

## 2022-05-28 NOTE — Transfer of Care (Signed)
Immediate Anesthesia Transfer of Care Note  Patient: Gina Alvarez  Procedure(s) Performed: REMOVAL OF TISSUE EXPANDER AND PLACEMENT OF IMPLANT (Bilateral: Breast)  Patient Location: PACU  Anesthesia Type:General  Level of Consciousness: awake, alert , oriented, drowsy, and patient cooperative  Airway & Oxygen Therapy: Patient Spontanous Breathing and Patient connected to face mask oxygen  Post-op Assessment: Report given to RN and Post -op Vital signs reviewed and stable  Post vital signs: Reviewed and stable  Last Vitals:  Vitals Value Taken Time  BP    Temp    Pulse 89 05/28/22 0953  Resp    SpO2 99 % 05/28/22 0953  Vitals shown include unvalidated device data.  Last Pain:  Vitals:   05/28/22 0731  TempSrc: Oral  PainSc: 0-No pain      Patients Stated Pain Goal: 3 (83/47/58 3074)  Complications: No notable events documented.

## 2022-05-28 NOTE — Discharge Instructions (Addendum)
INSTRUCTIONS FOR AFTER BREAST SURGERY   You will likely have some questions about what to expect following your operation.  The following information will help you and your family understand what to expect when you are discharged from the hospital.  It is important to follow these guidelines to help ensure a smooth recovery and reduce complication.  Postoperative instructions include information on: diet, wound care, medications and physical activity.  AFTER SURGERY Expect to go home after the procedure.  In some cases, you may need to spend one night in the hospital for observation.  DIET Breast surgery does not require a specific diet.  However, the healthier you eat the better your body will heal. It is important to increasing your protein intake.  This means limiting the foods with sugar and carbohydrates.  Focus on vegetables and some meat.  If you have liposuction during your procedure be sure to drink water.  If your urine is bright yellow, then it is concentrated, and you need to drink more water.  As a general rule after surgery, you should have 8 ounces of water every hour while awake.  If you find you are persistently nauseated or unable to take in liquids let us know.  NO TOBACCO USE or EXPOSURE.  This will slow your healing process and lead to a wound.  WOUND CARE Leave the binder on for 3 days . Use fragrance free soap like Dial, Dove or [place].   After 3 days you can remove the binder to shower. Once dry apply binder or sports bra. If you have liposuction you will have a soft and spongy dressing (Lipofoam) that helps prevent creases in your skin.  Remove before you shower and then replace it.  It is also available on [employer]. If you have steri-strips / tape directly attached to your skin leave them in place. It is OK to get these wet.   No baths, pools or hot tubs for four weeks. We close your incision to leave the smallest and best-looking scar. No ointment or creams on your incisions  for four weeks.  No Neosporin (Too many skin reactions).  A few weeks after surgery you can use Mederma and start massaging the scar. We ask you to wear your binder or sports bra for the first 6 weeks around the clock, including while sleeping. This provides added comfort and helps reduce the fluid accumulation at the surgery site. PLEASE NO Ice or heating pads to the operative site.  You have a very high risk of a BURN before you feel the temperature change.  ACTIVITY No heavy lifting until cleared by the doctor.  This usually means no more than a half-gallon of milk.  It is OK to walk and climb stairs. Moving your legs is very important to decrease your risk of a blood clot.  It will also help keep you from getting deconditioned.  Every 1 to 2 hours get up and walk for 5 minutes. This will help with a quicker recovery back to normal.  Let pain be your guide so you don't do too much.  This time is for you to recover.  You will be more comfortable if you sleep and rest with your head elevated either with a few pillows under you or in a recliner.  No stomach sleeping for a three months.  WORK Everyone returns to work at different times. As a rough guide, most people take at least 1 - 2 weeks off prior to returning to work.  If you need documentation for your job, give the forms to the front staff at the clinic.  DRIVING Arrange for someone to bring you home from the hospital after your surgery.  You may be able to drive a few days after surgery but not while taking any narcotics or valium.  BOWEL MOVEMENTS Constipation can occur after anesthesia and while taking pain medication.  It is important to stay ahead for your comfort.  We recommend taking Milk of Magnesia (2 tablespoons; twice a day) while taking the pain pills.  MEDICATIONS You may be prescribed should start after surgery At your preoperative visit for you history and physical you may have been given the following medications: An  antibiotic: Start this medication when you get home and take according to the instructions on the bottle. Zofran 4 mg:  This is to treat nausea and vomiting.  You can take this every 6 hours as needed and only if needed. Valium 2 mg for breast cancer patients: This is for muscle tightness if you have an implant or expander. This will help relax your muscle which also helps with pain control.  This can be taken every 12 hours as needed. Don't drive after taking this medication. Norco (hydrocodone/acetaminophen) 5/325 mg:  This is only to be used after you have taken the Motrin or the Tylenol. Every 8 hours as needed.   Over the counter Medication to take: Ibuprofen (Motrin) 600 mg:  Take this every 6 hours.  If you have additional pain then take 500 mg of the Tylenol every 8 hours.  Only take the Norco after you have tried these two. MiraLAX or Milk of Magnesia: Take this according to the bottle if you take the [place] Call your surgeon's office if any of the following occur: Fever 101 degrees F or greater Excessive bleeding or fluid from the incision site. Pain that increases over time without aid from the medications Redness, warmth, or pus draining from incision sites Persistent nausea or inability to take in liquids Severe misshapen area that underwent the operation.    Post Anesthesia Home Care Instructions  Activity: Get plenty of rest for the remainder of the day. A responsible individual must stay with you for 24 hours following the procedure.  For the next 24 hours, DO NOT: -Drive a car -Paediatric nurse -Drink alcoholic beverages -Take any medication unless instructed by your physician -Make any legal decisions or sign important papers.  Meals: Start with liquid foods such as gelatin or soup. Progress to regular foods as tolerated. Avoid greasy, spicy, heavy foods. If nausea and/or vomiting occur, drink only clear liquids until the nausea and/or vomiting  subsides. Call your physician if vomiting continues.  Special Instructions/Symptoms: Your throat may feel dry or sore from the anesthesia or the breathing tube placed in your throat during surgery. If this causes discomfort, gargle with warm salt water. The discomfort should disappear within 24 hours.  If you had a scopolamine patch placed behind your ear for the management of post- operative nausea and/or vomiting:  1. The medication in the patch is effective for 72 hours, after which it should be removed.  Wrap patch in a tissue and discard in the trash. Wash hands thoroughly with soap and water. 2. You may remove the patch earlier than 72 hours if you experience unpleasant side effects which may include dry mouth, dizziness or visual disturbances. 3. Avoid touching the patch. Wash your hands with soap and water after contact  with the patch.    May take Tylenol after 1:30pm if needed.

## 2022-05-28 NOTE — Anesthesia Postprocedure Evaluation (Signed)
Anesthesia Post Note  Patient: Gina Alvarez  Procedure(s) Performed: REMOVAL OF TISSUE EXPANDER AND PLACEMENT OF IMPLANT (Bilateral: Breast)     Patient location during evaluation: PACU Anesthesia Type: General Level of consciousness: awake and alert Pain management: pain level controlled Vital Signs Assessment: post-procedure vital signs reviewed and stable Respiratory status: spontaneous breathing, nonlabored ventilation, respiratory function stable and patient connected to nasal cannula oxygen Cardiovascular status: blood pressure returned to baseline and stable Postop Assessment: no apparent nausea or vomiting Anesthetic complications: no   No notable events documented.  Last Vitals:  Vitals:   05/28/22 1015 05/28/22 1053  BP: 106/63 104/61  Pulse: 70   Resp: 13 18  Temp:  36.6 C  SpO2: 98% 97%    Last Pain:  Vitals:   05/28/22 1029  TempSrc:   PainSc: 5                  Santa Lighter

## 2022-05-28 NOTE — Anesthesia Procedure Notes (Signed)
Procedure Name: LMA Insertion Date/Time: 05/28/2022 8:36 AM  Performed by: Willa Frater, CRNAPre-anesthesia Checklist: Patient identified, Emergency Drugs available, Suction available and Patient being monitored Patient Re-evaluated:Patient Re-evaluated prior to induction Oxygen Delivery Method: Circle system utilized Preoxygenation: Pre-oxygenation with 100% oxygen Induction Type: IV induction Ventilation: Mask ventilation without difficulty LMA: LMA inserted LMA Size: 4.0 Number of attempts: 1 Airway Equipment and Method: Bite block Placement Confirmation: positive ETCO2 Tube secured with: Tape Dental Injury: Teeth and Oropharynx as per pre-operative assessment

## 2022-05-28 NOTE — Interval H&P Note (Signed)
History and Physical Interval Note:  05/28/2022 8:16 AM  Gina Alvarez  has presented today for surgery, with the diagnosis of BRCA positive.  The various methods of treatment have been discussed with the patient and family. After consideration of risks, benefits and other options for treatment, the patient has consented to  Procedure(s): REMOVAL OF TISSUE EXPANDER AND PLACEMENT OF IMPLANT (Bilateral) as a surgical intervention.  The patient's history has been reviewed, patient examined, no change in status, stable for surgery.  I have reviewed the patient's chart and labs.  Questions were answered to the patient's satisfaction.     Loel Lofty Cadel Stairs

## 2022-05-29 ENCOUNTER — Encounter (HOSPITAL_BASED_OUTPATIENT_CLINIC_OR_DEPARTMENT_OTHER): Payer: Self-pay | Admitting: Plastic Surgery

## 2022-05-30 ENCOUNTER — Ambulatory Visit (INDEPENDENT_AMBULATORY_CARE_PROVIDER_SITE_OTHER): Payer: 59 | Admitting: Physician Assistant

## 2022-05-30 DIAGNOSIS — Z1501 Genetic susceptibility to malignant neoplasm of breast: Secondary | ICD-10-CM

## 2022-05-30 DIAGNOSIS — Z1509 Genetic susceptibility to other malignant neoplasm: Secondary | ICD-10-CM

## 2022-05-30 NOTE — Progress Notes (Signed)
This is a 32 year old female who is status post bilateral exchange of tissue expander for implants with bilateral capsulotomies for implant repositioning by Dr. Marla Roe on 05/28/2022.  She was seen via televisit.  The patient notes that she is doing very well, she notes the surgery was much easier when compared to previous, she notes the pain is well-tolerated she denies any issues or complaints.  Physical exam.  Patient speaking in full sentences in no acute distress.  Overall the patient is doing very well with no issues or complaints.  Would like to see her back in the office for her scheduled follow-up visit, she understands to reach out to [place] immediately if she develops any new or worsening signs or symptoms.  She had no further questions or concerns.

## 2022-06-05 ENCOUNTER — Telehealth: Payer: Self-pay | Admitting: Plastic Surgery

## 2022-06-05 NOTE — Telephone Encounter (Signed)
Sx was 1.3.24, pt called and stated she is experiencing diarrhea and keeps going to the bathroom and is still having nausea even after taking medication and wanted to speak to a provider

## 2022-06-06 ENCOUNTER — Telehealth: Payer: Self-pay | Admitting: Physician Assistant

## 2022-06-06 ENCOUNTER — Encounter: Payer: 59 | Admitting: Plastic Surgery

## 2022-06-06 ENCOUNTER — Encounter: Payer: Self-pay | Admitting: Family

## 2022-06-06 ENCOUNTER — Telehealth: Payer: Self-pay | Admitting: Plastic Surgery

## 2022-06-06 ENCOUNTER — Telehealth (INDEPENDENT_AMBULATORY_CARE_PROVIDER_SITE_OTHER): Payer: 59 | Admitting: Family

## 2022-06-06 DIAGNOSIS — R197 Diarrhea, unspecified: Secondary | ICD-10-CM

## 2022-06-06 DIAGNOSIS — R110 Nausea: Secondary | ICD-10-CM

## 2022-06-06 MED ORDER — PROCHLORPERAZINE MALEATE 5 MG PO TABS: 5.0000 mg | ORAL_TABLET | Freq: Four times a day (QID) | ORAL | 0 refills | Status: DC | PRN

## 2022-06-06 NOTE — Telephone Encounter (Signed)
Per pt, physician said, it seems just had a viral infection.  Gave her more nausea medication.  If any questions give her a call.

## 2022-06-06 NOTE — Telephone Encounter (Signed)
I spoke with Ms. Couse this morning, she is having ongoing diarrhea and notes she is nauseous.  She did reach out to her primary care office and will be seen virtually.  She understands to reach out to our office if she has any further questions or concerns.

## 2022-06-06 NOTE — Telephone Encounter (Signed)
Thanks

## 2022-06-06 NOTE — Progress Notes (Signed)
Virtual Visit Consent   Gina Alvarez, you are scheduled for a virtual visit with a [facility] provider today. Just as with appointments in the office, your consent must be obtained to participate. Your consent will be active for this visit and any virtual visit you may have with one of our providers in the next 365 days. If you have a MyChart account, a copy of this consent can be sent to you electronically.  As this is a virtual visit, video technology does not allow for your provider to perform a traditional examination. This may limit your provider's ability to fully assess your condition. If your provider identifies any concerns that need to be evaluated in person or the need to arrange testing (such as labs, EKG, etc.), we will make arrangements to do so. Although advances in technology are sophisticated, we cannot ensure that it will always work on either your end or our end. If the connection with a video visit is poor, the visit may have to be switched to a telephone visit. With either a video or telephone visit, we are not always able to ensure that we have a secure connection.  By engaging in this virtual visit, you consent to the provision of healthcare and authorize for your insurance to be billed (if applicable) for the services provided during this visit. Depending on your insurance coverage, you may receive a charge related to this service.  I need to obtain your verbal consent now. Are you willing to proceed with your visit today? Gina Alvarez has provided verbal consent on 06/06/2022 for a virtual visit (video or telephone). Evelina Dun, FNP  Date: 06/06/2022 12:40 PM  Virtual Visit via Video Note   I, Evelina Dun, connected with  Gina Alvarez  ([id number], Dec 29, 1990) on 06/06/22 at  5:30 PM EST by a video-enabled telemedicine application and verified that I am speaking with the correct person using two identifiers.  Location: Patient: Virtual Visit Location Patient:  Other: car Provider: Virtual Visit Location Provider: Office/Clinic   I discussed the limitations of evaluation and management by telemedicine and the availability of in person appointments. The patient expressed understanding and agreed to proceed.    History of Present Illness: Gina Alvarez is a 32 y.o. who identifies as a female who was assigned female at birth, and is being seen today for nausea and vomiting and diarrhea. She had surgery to remove tissue expander and placement of implant on 05/28/22. She was given zofran as needed. Reports this has not helped. Denies any fever or vomiting.   HPI: HPI  Problems:  Patient Active Problem List   Diagnosis Date Noted   Breast cancer ([facility]) 01/09/2022   BRCA positive    Calculus of gallbladder without cholecystitis without obstruction 08/30/2020   FHx: BRCA gene positive 10/21/2016   Urinary retention 06/14/2012    Allergies: No Known Allergies Medications:  Current Outpatient Medications:    prochlorperazine (COMPAZINE) 5 MG tablet, Take 1 tablet (5 mg total) by mouth every 6 (six) hours as needed for nausea or vomiting., Disp: 30 tablet, Rfl: 0   azelastine (ASTELIN) 0.1 % nasal spray, Place 1 spray into both nostrils 2 (two) times daily. (Patient not taking: Reported on 05/16/2022), Disp: 30 mL, Rfl: 12   desvenlafaxine (PRISTIQ) 50 MG 24 hr tablet, Take 1 tablet (50 mg total) by mouth daily., Disp: 90 tablet, Rfl: 0   levocetirizine (XYZAL) 5 MG tablet, Take 1 tablet (5 mg total) by mouth every  evening., Disp: 90 tablet, Rfl: 3   levothyroxine (SYNTHROID) 50 MCG tablet, Take 1 tablet (50 mcg total) by mouth daily., Disp: 90 tablet, Rfl: 0   ondansetron (ZOFRAN) 4 MG tablet, Take 1 tablet (4 mg total) by mouth every 8 (eight) hours as needed for nausea or vomiting., Disp: 20 tablet, Rfl: 0   tiZANidine (ZANAFLEX) 4 MG tablet, Take 1 to 2 tablets by mouth every 8 hours as needed for muscle spasms., Disp: 90 tablet, Rfl: PRN   zolpidem  (AMBIEN) 5 MG tablet, Take 0.5-1 tablets (2.5-5 mg total) by mouth at bedtime as needed for sleep. (Patient not taking: Reported on 05/16/2022), Disp: 15 tablet, Rfl: 5  Observations/Objective: Patient is well-developed, well-nourished in no acute distress.  Resting comfortably in car.  Head is normocephalic, atraumatic.  No labored breathing.  Speech is clear and coherent with logical content.  Patient is alert and oriented at baseline.   Assessment and Plan: 1. Nausea - prochlorperazine (COMPAZINE) 5 MG tablet; Take 1 tablet (5 mg total) by mouth every 6 (six) hours as needed for nausea or vomiting.  Dispense: 30 tablet; Refill: 0  2. Diarrhea, unspecified type  Unsure if this is viral or from surgery?  Continue zofran and add compazine as needed Imodium as needed for diarrhea Force fluids BRAT diet  Follow up if symptoms worsen or do not improve   Follow Up Instructions: I discussed the assessment and treatment plan with the patient. The patient was provided an opportunity to ask questions and all were answered. The patient agreed with the plan and demonstrated an understanding of the instructions.  A copy of instructions were sent to the patient via MyChart unless otherwise noted below.     The patient was advised to call back or seek an in-person evaluation if the symptoms worsen or if the condition fails to improve as anticipated.  Time:  I spent 9 minutes with the patient via telehealth technology discussing the above problems/concerns.    Evelina Dun, FNP

## 2022-06-06 NOTE — Patient Instructions (Signed)
Nausea, Adult Nausea is the feeling of having an upset stomach or that you are about to vomit. Nausea on its own is not usually a serious concern, but it may be an early sign of a more serious medical problem. As nausea gets worse, it can lead to vomiting. If vomiting develops, or if you are not able to drink enough fluids, you are at risk of becoming dehydrated. Dehydration can make you tired and thirsty, cause you to have a dry mouth, and decrease how often you urinate. Older adults and people with other diseases or a weak disease-fighting system (immune system) are at higher risk for dehydration. The main goals of treating your nausea are: To relieve your nausea. To limit repeated nausea episodes. To prevent vomiting and dehydration. Follow these instructions at home: Watch your symptoms for any changes. Tell your health care provider about them. Eating and drinking     Take an oral rehydration solution (ORS). This is a drink that is sold at pharmacies and retail stores. Drink clear fluids slowly and in small amounts as you are able. Clear fluids include water, ice chips, low-calorie sports drinks, and fruit juice that has water added (diluted fruit juice). Eat bland, easy-to-digest foods in small amounts as you are able. These foods include bananas, applesauce, rice, lean meats, toast, and crackers. Avoid drinking fluids that contain a lot of sugar or caffeine, such as energy drinks, sports drinks, and soda. Avoid alcohol. Avoid spicy or fatty foods. General instructions Take over-the-counter and prescription medicines only as told by your health care provider. Rest at home while you recover. Drink enough fluid to keep your urine pale yellow. Breathe slowly and deeply when you feel nauseous. Avoid smelling things that have strong odors. Wash your hands often using soap and water for at least 20 seconds. If soap and water are not available, use hand sanitizer. Make sure that everyone in  your household washes their hands well and often. Keep all follow-up visits. This is important. Contact a health care provider if: Your nausea gets worse. Your nausea does not go away after two days. You vomit multiple times. You cannot drink fluids without vomiting. You have any of the following: New symptoms. A fever. A headache. Muscle cramps. A rash. Pain while urinating. You feel light-headed or dizzy. Get help right away if: You have pain in your chest, neck, arm, or jaw. You feel extremely weak or you faint. You have vomit that is bright red or looks like coffee grounds. You have bloody or black stools (feces) or stools that look like tar. You have a severe headache, a stiff neck, or both. You have severe pain, cramping, or bloating in your abdomen. You have difficulty breathing or are breathing very quickly. Your heart is beating very quickly. Your skin feels cold and clammy. You feel confused. You have signs of dehydration, such as: Dark urine, very little urine, or no urine. Cracked lips. Dry mouth. Sunken eyes. Sleepiness. Weakness. These symptoms may be an emergency. Get help right away. Call 911. Do not wait to see if the symptoms will go away. Do not drive yourself to the hospital. Summary Nausea is the feeling that you have an upset stomach or that you are about to vomit. Nausea on its own is not usually a serious concern, but it may be an early sign of a more serious medical problem. If vomiting develops, or if you are not able to drink enough fluids, you are at risk of becoming   dehydrated. Follow recommendations for eating and drinking and take over-the-counter and prescription medicines only as told by your health care provider. Contact a health care provider right away if your symptoms worsen or you have new symptoms. Keep all follow-up visits. This is important. This information is not intended to replace advice given to you by your health care provider.  Make sure you discuss any questions you have with your health care provider. Document Revised: 11/16/2020 Document Reviewed: 11/16/2020 Elsevier Patient Education  2023 Elsevier Inc.  

## 2022-06-10 ENCOUNTER — Ambulatory Visit (INDEPENDENT_AMBULATORY_CARE_PROVIDER_SITE_OTHER): Payer: 59 | Admitting: Plastic Surgery

## 2022-06-10 ENCOUNTER — Encounter: Payer: Self-pay | Admitting: Plastic Surgery

## 2022-06-10 VITALS — BP 115/73 | HR 79 | Ht 59.0 in | Wt 152.0 lb

## 2022-06-10 DIAGNOSIS — Z9013 Acquired absence of bilateral breasts and nipples: Secondary | ICD-10-CM

## 2022-06-10 DIAGNOSIS — Z9010 Acquired absence of unspecified breast and nipple: Secondary | ICD-10-CM | POA: Insufficient documentation

## 2022-06-10 NOTE — Progress Notes (Signed)
   Subjective:    Patient ID: Gina Alvarez, female    DOB: [birth year], 32 y.o.   MRN: [id number]  The patient is a 32 year old female here for follow-up after breast surgery.  The patient is 4 feet 11 inches tall weighs around 150 pounds.  She did not have breast cancer but is positive for BRCA and therefore underwent bilateral mastectomies in August with expander placement.  She had her exchange January 3.  She has ultrahigh profile 590 cc implants bilaterally.  She still has a little bit of bruising and swelling but overall she is doing well.  She had some diarrhea after the surgery and we connected her with her PCP.  She was diagnosed with a gastrointestinal virus and is doing much better now.      Review of Systems  Constitutional: Negative.   Eyes: Negative.   Respiratory: Negative.    Cardiovascular: Negative.   Gastrointestinal: Negative.   Endocrine: Negative.   Genitourinary: Negative.   Musculoskeletal: Negative.        Objective:   Physical Exam Constitutional:      Appearance: Normal appearance.  Cardiovascular:     Rate and Rhythm: Normal rate.     Pulses: Normal pulses.  Neurological:     Mental Status: She is alert and oriented to person, place, and time.  Psychiatric:        Mood and Affect: Mood normal.        Behavior: Behavior normal.        Thought Content: Thought content normal.        Judgment: Judgment normal.        Assessment & Plan:     ICD-10-CM   1. Acquired absence of both breasts  Z90.13       Pictures were obtained of the patient and placed in the chart with the patient's or guardian's permission.  Continue with the sports bra and light massage.  Will see her back in a week and a half.  She is thinking about nipple areola tattoos and will talk to [facility] about the at her next appointment.

## 2022-06-12 ENCOUNTER — Telehealth: Payer: Self-pay | Admitting: Plastic Surgery

## 2022-06-12 MED ORDER — DOXYCYCLINE HYCLATE 100 MG PO TABS: 100.0000 mg | ORAL_TABLET | Freq: Two times a day (BID) | ORAL | 0 refills | Status: DC

## 2022-06-12 NOTE — Telephone Encounter (Signed)
Calling again, she is red, has discharge, and wants to know if she needs to come in.

## 2022-06-12 NOTE — Telephone Encounter (Signed)
Pt called and says incision is crusty and wanting to make sure that is ok. When she took off her bra she noticed it, pt stated she still has the tape on her incision. Pt requested to be called back at her work. Work [phone]

## 2022-06-12 NOTE — Telephone Encounter (Signed)
She is coming in tomorrow morning, thanks

## 2022-06-12 NOTE — Telephone Encounter (Signed)
I spoke with Cyril Mourning, she notes some irritation around her incision with some crusting.  She sent a photo, I did review this they does note some irritation with some questionable cellulitic changes around that, she is afebrile and otherwise feeling well.  I do think it is reasonable to start her on the antibiotic, I have sent doxycycline into the pharmacy for her to start this evening.  She has follow-up in the morning with our office.  We will see her at that time unless she develops any new or worsening signs or symptoms.

## 2022-06-13 ENCOUNTER — Encounter: Payer: Self-pay | Admitting: Surgical

## 2022-06-13 ENCOUNTER — Ambulatory Visit (INDEPENDENT_AMBULATORY_CARE_PROVIDER_SITE_OTHER): Payer: 59 | Admitting: Surgical

## 2022-06-13 VITALS — BP 126/83 | HR 103 | Temp 98.0°F

## 2022-06-13 DIAGNOSIS — Z9013 Acquired absence of bilateral breasts and nipples: Secondary | ICD-10-CM

## 2022-06-13 MED ORDER — PROMETHAZINE HCL 12.5 MG PO TABS: 12.5000 mg | ORAL_TABLET | Freq: Four times a day (QID) | ORAL | 0 refills | Status: DC | PRN

## 2022-06-13 NOTE — Progress Notes (Signed)
Patient is a 32 year old female here for follow-up after removal of bilateral breast tissue expanders and placement of bilateral breast implants with Dr. Marla Roe on 05/28/2022.  She called the office yesterday with concerns of irritation and redness surrounding her incision of the left breast.  She was sent in a prescription for doxycycline to start yesterday evening.  She presents today for in person evaluation.  She reports that she has been having some nausea over the last few days to week, was prescribed Compazine by PCP.  She reports that the Compazine is helpful for the nausea, however makes her very sleepy and she is unable to take it during the day.  She reports the diarrhea has resolved, this occurred earlier last week but has improved.  She reports she is urinating normally.  She reports that she has not been eating as much as normal, reports she is drinking water but may be could increase the amount.  Chaperone present on exam On exam right breast incisions are intact, healing well.  Left breast incision with approximately 2 mm separation along the midline with some surrounding erythema present.  I do not appreciate any active drainage at this time.  She has some exudative drainage noted on gauze.  I do not appreciate any subcutaneous fluid collection with palpation.  I do not appreciate any foul odors.   The incisional dehiscence does not appear to be full-thickness, the implant is not visible.  I do not see any subdermal fat  A/P:  Recommend Vaseline and gauze to the left breast incisional dehiscence, discussed with patient to continue to closely monitor this area for increased redness or increased separation.  We discussed follow-up early next week to reevaluate.  Recommend changing the Vaseline and gauze dressing 1-2 times per day.  Recommend continuing with doxycycline prescription until follow-up next week when we can reevaluate.  She is using a probiotic.  Recommend starting  multivitamin to help with healing.  Given she is currently still having nausea throughout the day as she is unable to take the Compazine due to drowsiness, will send prescription for Phenergan given Zofran was not helpful for her.  She was understanding to take the Compazine at night as needed for nausea and to take the Phenergan throughout the day.  We discussed calling the office if her symptoms change or worsen, picture was taken and placed in her chart with her permission.

## 2022-06-17 ENCOUNTER — Ambulatory Visit (INDEPENDENT_AMBULATORY_CARE_PROVIDER_SITE_OTHER): Payer: 59 | Admitting: Surgical

## 2022-06-17 VITALS — BP 106/71 | HR 77

## 2022-06-17 DIAGNOSIS — Z9013 Acquired absence of bilateral breasts and nipples: Secondary | ICD-10-CM

## 2022-06-17 DIAGNOSIS — Z1509 Genetic susceptibility to other malignant neoplasm: Secondary | ICD-10-CM

## 2022-06-17 DIAGNOSIS — Z1501 Genetic susceptibility to malignant neoplasm of breast: Secondary | ICD-10-CM

## 2022-06-17 NOTE — Progress Notes (Signed)
Patient is a 32 year old female here for follow-up after removal of bilateral breast tissue expanders and placement of bilateral breast implants with Dr. Marla Roe on 05/28/2022.  She was last seen in the office on 06/13/2022, had some mild incisional dehiscence that was approximately 2 mm.  She also had some surrounding redness which has resolved.  She does not report any ongoing nausea today  She is not having any infectious symptoms, she does have a suture protruding through the skin in this area.  This was removed and she tolerated this well.  The wound depth is approximately 1 mm.  There appears to be healthy tissue at the base of the wound bed.  There is no exposed implant.  A/P:  Recommend continuing with Vaseline gauze to the left breast incision dehiscence, recommend continue to closely monitor this area for increased separation, redness.  Recommend stopping the doxycycline at this point as there is no longer concern for infection on exam.  Recommend following up in 1 week for reevaluation.  Pictures were obtained of the patient and placed in the chart with the patient's or guardian's permission.

## 2022-06-20 ENCOUNTER — Encounter: Payer: 59 | Admitting: Surgical

## 2022-06-20 ENCOUNTER — Ambulatory Visit (INDEPENDENT_AMBULATORY_CARE_PROVIDER_SITE_OTHER): Payer: 59 | Admitting: Surgical

## 2022-06-20 DIAGNOSIS — Z1501 Genetic susceptibility to malignant neoplasm of breast: Secondary | ICD-10-CM

## 2022-06-20 DIAGNOSIS — Z8481 Family history of carrier of genetic disease: Secondary | ICD-10-CM

## 2022-06-20 DIAGNOSIS — Z9013 Acquired absence of bilateral breasts and nipples: Secondary | ICD-10-CM

## 2022-06-20 DIAGNOSIS — Z719 Counseling, unspecified: Secondary | ICD-10-CM

## 2022-06-20 DIAGNOSIS — Z1509 Genetic susceptibility to other malignant neoplasm: Secondary | ICD-10-CM

## 2022-06-20 NOTE — Progress Notes (Signed)
Patient is a very pleasant 32 year old female here for follow-up on her bilateral breast reconstruction.  She recently had the expanders exchanged to implants on 05/28/2022.  She is just over 3 weeks postop.  She comes today for follow-up to evaluate a left breast incisional dehiscence.  She reports that is doing much better.  She is not having any issues at this time.  Dr. Marla Roe and I evaluated patient, the area has significantly improved.  It has nearly completely closed and epithelialized.  There is no exposed implant.  There is no surrounding erythema or swelling noted.  Recommend continuing with Vaseline and gauze to the area, cover with nonstick gauze.  We will plan to see her back in 1 week for reevaluation.  We discussed nipple areola tattooing, we will plan to discuss this further in 1 week.  Pictures were obtained of the patient and placed in the chart with the patient's or guardian's permission.

## 2022-06-21 DIAGNOSIS — R509 Fever, unspecified: Secondary | ICD-10-CM | POA: Diagnosis not present

## 2022-06-21 DIAGNOSIS — R059 Cough, unspecified: Secondary | ICD-10-CM | POA: Diagnosis not present

## 2022-06-21 DIAGNOSIS — R0981 Nasal congestion: Secondary | ICD-10-CM | POA: Diagnosis not present

## 2022-06-27 ENCOUNTER — Encounter: Payer: Self-pay | Admitting: Surgical

## 2022-06-27 ENCOUNTER — Ambulatory Visit (INDEPENDENT_AMBULATORY_CARE_PROVIDER_SITE_OTHER): Payer: 59 | Admitting: Surgical

## 2022-06-27 VITALS — BP 104/52 | HR 78

## 2022-06-27 DIAGNOSIS — Z1501 Genetic susceptibility to malignant neoplasm of breast: Secondary | ICD-10-CM

## 2022-06-27 DIAGNOSIS — Z8481 Family history of carrier of genetic disease: Secondary | ICD-10-CM

## 2022-06-27 DIAGNOSIS — Z9013 Acquired absence of bilateral breasts and nipples: Secondary | ICD-10-CM

## 2022-06-27 DIAGNOSIS — Z1509 Genetic susceptibility to other malignant neoplasm: Secondary | ICD-10-CM

## 2022-06-27 NOTE — Progress Notes (Signed)
Patient is a 32 year old female here for follow-up after exchange of her bilateral breast tissue expanders for bilateral breast implants with Dr. Marla Roe on 05/28/2022.  She is approximately 1 month postop.  She has overall been doing well, she initially had a small area of incisional dehiscence which seem to be related to a suture protruding through the skin.  This is continued to improve.  She feels as if it has completely resolved.  She is not having any infectious symptoms.  She has no concerns at this time.  She does report that she sometimes wakes up on her right breast implant  Chaperone present on exam On exam bilateral breast incisions are intact and healing well, she does have a little bit of scabbing of the left medial breast incision.  There is no erythema or cellulitic changes of either breast.  No subcutaneous fluid collection noted.  No signs of infection on exam.  The right implant is slightly higher and has more superior pole volume than the left, however it appears to only be a few millimeters off.  A/P:  Continue with compressive garments, which needs activities or heavy lifting.  I would like to see her back in 3 weeks for reevaluation.  She can continue with the use of scar creams.  We discussed nipple areola tattooing, I would recommend waiting until approximately 3 months after surgery for initial tattooing.  There is no signs of infection on exam.  Recommend continue to monitor the left breast for any changes.  Pictures were obtained of the patient and placed in the chart with the patient's or guardian's permission.

## 2022-07-04 ENCOUNTER — Encounter: Payer: 59 | Admitting: Surgical

## 2022-07-14 ENCOUNTER — Encounter: Payer: Self-pay | Admitting: Family Medicine

## 2022-07-14 ENCOUNTER — Telehealth: Payer: 59 | Admitting: Family Medicine

## 2022-07-14 ENCOUNTER — Other Ambulatory Visit (HOSPITAL_BASED_OUTPATIENT_CLINIC_OR_DEPARTMENT_OTHER): Payer: Self-pay

## 2022-07-14 ENCOUNTER — Emergency Department (HOSPITAL_BASED_OUTPATIENT_CLINIC_OR_DEPARTMENT_OTHER): Payer: PRIVATE HEALTH INSURANCE

## 2022-07-14 ENCOUNTER — Encounter (HOSPITAL_BASED_OUTPATIENT_CLINIC_OR_DEPARTMENT_OTHER): Payer: Self-pay | Admitting: Emergency Medicine

## 2022-07-14 ENCOUNTER — Emergency Department (HOSPITAL_BASED_OUTPATIENT_CLINIC_OR_DEPARTMENT_OTHER)
Admission: EM | Admit: 2022-07-14 | Discharge: 2022-07-14 | Disposition: A | Discharge status: Home / Self Care | Payer: PRIVATE HEALTH INSURANCE | Attending: Emergency Medicine | Admitting: Emergency Medicine

## 2022-07-14 ENCOUNTER — Other Ambulatory Visit: Payer: Self-pay

## 2022-07-14 DIAGNOSIS — R112 Nausea with vomiting, unspecified: Secondary | ICD-10-CM

## 2022-07-14 DIAGNOSIS — R748 Abnormal levels of other serum enzymes: Secondary | ICD-10-CM | POA: Insufficient documentation

## 2022-07-14 DIAGNOSIS — K529 Noninfective gastroenteritis and colitis, unspecified: Secondary | ICD-10-CM | POA: Insufficient documentation

## 2022-07-14 DIAGNOSIS — Z9882 Breast implant status: Secondary | ICD-10-CM | POA: Diagnosis not present

## 2022-07-14 DIAGNOSIS — R110 Nausea: Secondary | ICD-10-CM | POA: Diagnosis not present

## 2022-07-14 DIAGNOSIS — R945 Abnormal results of liver function studies: Secondary | ICD-10-CM | POA: Diagnosis not present

## 2022-07-14 LAB — COMPREHENSIVE METABOLIC PANEL
ALT: 221 U/L — ABNORMAL HIGH (ref 0–44)
AST: 205 U/L — ABNORMAL HIGH (ref 15–41)
Albumin: 4.7 g/dL (ref 3.5–5.0)
Alkaline Phosphatase: 150 U/L — ABNORMAL HIGH (ref 38–126)
Anion gap: 11 (ref 5–15)
BUN: 9 mg/dL (ref 6–20)
CO2: 23 mmol/L (ref 22–32)
Calcium: 9.9 mg/dL (ref 8.9–10.3)
Chloride: 105 mmol/L (ref 98–111)
Creatinine, Ser: 0.53 mg/dL (ref 0.44–1.00)
GFR, Estimated: >60 mL/min (ref >60)
Glucose, Bld: 101 mg/dL — ABNORMAL HIGH (ref 70–99)
Potassium: 3.8 mmol/L (ref 3.5–5.1)
Sodium: 139 mmol/L (ref 135–145)
Total Bilirubin: 1.1 mg/dL (ref 0.3–1.2)
Total Protein: 7.7 g/dL (ref 6.5–8.1)

## 2022-07-14 LAB — CBC
HCT: 38.8 % (ref 36.0–46.0)
Hemoglobin: 13.9 g/dL (ref 12.0–15.0)
MCH: 31.4 pg (ref 26.0–34.0)
MCHC: 35.8 g/dL (ref 30.0–36.0)
MCV: 87.6 fL (ref 80.0–100.0)
Platelets: 293 10*3/uL (ref 150–400)
RBC: 4.43 MIL/uL (ref 3.87–5.11)
RDW: 11.9 % (ref 11.5–15.5)
WBC: 5.6 10*3/uL (ref 4.0–10.5)
nRBC: 0 % (ref 0.0–0.2)

## 2022-07-14 LAB — MONONUCLEOSIS SCREEN: Mono Screen: NEGATIVE

## 2022-07-14 LAB — HCG, SERUM, QUALITATIVE: Preg, Serum: NEGATIVE

## 2022-07-14 LAB — LIPASE, BLOOD: Lipase: 18 U/L (ref 11–51)

## 2022-07-14 LAB — ACETAMINOPHEN LEVEL: Acetaminophen (Tylenol), Serum: <10 ug/mL — ABNORMAL LOW (ref 10–30)

## 2022-07-14 MED ORDER — METOCLOPRAMIDE HCL 5 MG/ML IJ SOLN
10.0000 mg | Freq: Once | INTRAMUSCULAR | Status: AC
  Administered 2022-07-14: 10 mg via INTRAVENOUS
  Filled 2022-07-14: qty 2

## 2022-07-14 MED ORDER — METOCLOPRAMIDE HCL 10 MG PO TABS: 10.0000 mg | ORAL_TABLET | Freq: Four times a day (QID) | ORAL | 0 refills | Status: DC

## 2022-07-14 MED ORDER — IOHEXOL 300 MG/ML  SOLN
100.0000 mL | Freq: Once | INTRAMUSCULAR | Status: AC | PRN
  Administered 2022-07-14: 75 mL via INTRAVENOUS

## 2022-07-14 NOTE — ED Triage Notes (Signed)
Pt arrives to ED with c/o on-going nausea and vomiting since 05/28/2022 after breast implants. She reports double mastectomy in 12/2021 for BRCA gene. She has f/u with her surgeon for same.

## 2022-07-14 NOTE — Progress Notes (Signed)
Pt decided to go to ER instead.

## 2022-07-14 NOTE — ED Provider Notes (Signed)
[facility] Provider Note   CSN: [id number] Arrival date & time: 07/14/22  1036     History  Chief Complaint  Patient presents with   Nausea   Emesis    Gina Alvarez is a 32 y.o. female presents to the ED complaining of ongoing nausea since 05/28/2022 when she had bilateral breast implants placed.  She reports that she has been taking prescription medications for nausea, but was concerned when she began having random episodes of vomiting today.  She reports 2 episodes of vomiting as well as 1 episode of diarrhea today.  She has had close follow-up with her surgeon that performed her breast augmentation.  Denies fever, abdominal pain, weakness, melena, lightheadedness, dizziness, dysuria.     Home Medications Prior to Admission medications   Medication Sig Start Date End Date Taking? Authorizing Provider  metoCLOPramide (REGLAN) 10 MG tablet Take 1 tablet (10 mg total) by mouth every 6 (six) hours. 07/14/22  Yes Remington Skalsky R, PA  azelastine (ASTELIN) 0.1 % nasal spray Place 1 spray into both nostrils 2 (two) times daily. 09/18/21   Janora Norlander, DO  desvenlafaxine (PRISTIQ) 50 MG 24 hr tablet Take 1 tablet (50 mg total) by mouth daily. 04/21/22   Janora Norlander, DO  doxycycline (VIBRA-TABS) 100 MG tablet Take 1 tablet (100 mg total) by mouth 2 (two) times daily. 06/12/22   Hedges, Dellis Filbert, PA-C  levocetirizine (XYZAL) 5 MG tablet Take 1 tablet (5 mg total) by mouth every evening. 09/18/21   Janora Norlander, DO  levothyroxine (SYNTHROID) 50 MCG tablet Take 1 tablet (50 mcg total) by mouth daily. 04/22/22   Janora Norlander, DO  prochlorperazine (COMPAZINE) 10 MG tablet Take 5 mg by mouth every 6 (six) hours as needed. 06/06/22   [provider]  promethazine (PHENERGAN) 12.5 MG tablet Take 1 tablet (12.5 mg total) by mouth every 6 (six) hours as needed for nausea or vomiting. 06/13/22   Scheeler, Carola Rhine, PA-C   tiZANidine (ZANAFLEX) 4 MG tablet Take 1 to 2 tablets by mouth every 8 hours as needed for muscle spasms. 05/27/22   Janora Norlander, DO      Allergies    Patient has no known allergies.    Review of Systems   Review of Systems  Constitutional:  Negative for fever.  Gastrointestinal:  Positive for diarrhea, nausea and vomiting. Negative for abdominal pain and blood in stool.  Genitourinary:  Negative for dysuria.  Neurological:  Negative for dizziness, weakness and light-headedness.    Physical Exam Updated Vital Signs BP 106/77 (BP Location: Right Arm)   Pulse 98   Temp 99 F (37.2 C) (Oral)   Resp 18   Ht 4' 11"$  (1.499 m)   Wt 68 kg   SpO2 100%   BMI 30.30 kg/m  Physical Exam Vitals and nursing note reviewed. Exam conducted with a chaperone present.  Constitutional:      General: She is not in acute distress.    Appearance: She is not ill-appearing.  HENT:     Mouth/Throat:     Mouth: Mucous membranes are moist.     Pharynx: Oropharynx is clear.  Cardiovascular:     Rate and Rhythm: Normal rate and regular rhythm.     Pulses: Normal pulses.     Heart sounds: Normal heart sounds.  Pulmonary:     Effort: Pulmonary effort is normal. No respiratory distress.     Breath sounds: Normal  breath sounds and air entry.  Chest:     Chest wall: No swelling or edema.     Comments: By lateral nipples are surgically missing due to patient having prior double mastectomy.  Patient has bilateral breast implants. Abdominal:     General: Abdomen is flat. Bowel sounds are normal. There is no distension.     Palpations: Abdomen is soft.     Tenderness: There is no abdominal tenderness.  Skin:    General: Skin is warm and dry.     Capillary Refill: Capillary refill takes less than 2 seconds.  Neurological:     Mental Status: She is alert. Mental status is at baseline.  Psychiatric:        Mood and Affect: Mood normal.        Behavior: Behavior normal.     ED Results /  Procedures / Treatments   Labs (all labs ordered are listed, but only abnormal results are displayed) Labs Reviewed  COMPREHENSIVE METABOLIC PANEL - Abnormal; Notable for the following components:      Result Value   Glucose, Bld 101 (*)    AST 205 (*)    ALT 221 (*)    Alkaline Phosphatase 150 (*)    All other components within normal limits  ACETAMINOPHEN LEVEL - Abnormal; Notable for the following components:   Acetaminophen (Tylenol), Serum <10 (*)    All other components within normal limits  LIPASE, BLOOD  CBC  HCG, SERUM, QUALITATIVE  MONONUCLEOSIS SCREEN  HEPATITIS PANEL, ACUTE    EKG None  Radiology CT CHEST ABDOMEN PELVIS W CONTRAST  Result Date: 07/14/2022 CLINICAL DATA:  Recent breast implant surgery, elevated LFTs, persistent nausea. EXAM: CT CHEST, ABDOMEN, AND PELVIS WITH CONTRAST TECHNIQUE: Multidetector CT imaging of the chest, abdomen and pelvis was performed following the standard protocol during bolus administration of intravenous contrast. RADIATION DOSE REDUCTION: This exam was performed according to the departmental dose-optimization program which includes automated exposure control, adjustment of the mA and/or kV according to patient size and/or use of iterative reconstruction technique. CONTRAST:  67m OMNIPAQUE IOHEXOL 300 MG/ML  SOLN COMPARISON:  CT June 05, 2020. FINDINGS: CT CHEST FINDINGS Cardiovascular: No significant vascular findings. Normal heart size. No pericardial effusion. Mediastinum/Nodes: No enlarged mediastinal, hilar, or axillary lymph nodes. Thyroid gland, trachea, and esophagus demonstrate no significant findings. Lungs/Pleura: Lungs are clear. No pleural effusion or pneumothorax. Musculoskeletal: Bilateral breast prostheses. No suspicious chest wall mass or fluid collection. No suspicious bone lesions identified. CT ABDOMEN PELVIS FINDINGS Hepatobiliary: No suspicious hepatic lesion. Gallbladder surgically absent. Prominence of the central  hepatic and extrahepatic biliary tree with the common duct measuring 8 mm in diameter previously 7 mm. Pancreas: No pancreatic ductal dilation or evidence of acute inflammation. Spleen: No splenomegaly. Adrenals/Urinary Tract: Bilateral adrenal glands appear normal. No hydronephrosis. Kidneys demonstrate symmetric enhancement. Urinary bladder is unremarkable for degree of distension. Stomach/Bowel: Stomach is unremarkable for degree of distension. No pathologic dilation of small or large bowel. Prominent loops of fluid-filled distal small bowel. Gas fluid levels in the colon. Vascular/Lymphatic: Normal caliber abdominal aorta. Smooth IVC contours. The portal, splenic and superior mesenteric veins are patent. No pathologically enlarged abdominal or pelvic lymph nodes. Reproductive: Intrauterine device appears appropriate in positioning Other: Trace pelvic free fluid is within physiologic normal limits Musculoskeletal: No acute osseous abnormality IMPRESSION: 1. Prominent loops of fluid-filled distal small bowel with air-fluid levels in the colon, nonspecific but can be seen in the setting of enteritis with diarrheal illness versus  recent laxative administration. 2. Prominence of the central hepatic and extrahepatic biliary tree with the common duct measuring 8 mm in diameter previously 7 mm, likely reflecting reservoir effect in the setting of prior cholecystectomy. Suggest correlation with laboratory values (total bilirubin) to assess for obstruction. 3. Surgical changes of bilateral breast implants without drainable fluid collection. Electronically Signed   By: Dahlia Bailiff M.D.   On: 07/14/2022 13:11    Procedures Procedures    Medications Ordered in ED Medications  iohexol (OMNIPAQUE) 300 MG/ML solution 100 mL (75 mLs Intravenous Contrast Given 07/14/22 1253)  metoCLOPramide (REGLAN) injection 10 mg (10 mg Intravenous Given 07/14/22 1311)    ED Course/ Medical Decision Making/ A&P                              Medical Decision Making Amount and/or Complexity of Data Reviewed Labs: ordered. Radiology: ordered.  Risk Prescription drug management.   This patient presents to the ED with chief complaint(s) of persistent nausea with episodes of vomiting and diarrhea today with pertinent past medical history of BRCA gene positive, double mastectomy, breast augmentation, cholecystectomy.  The complaint involves an extensive differential diagnosis and also carries with it a high risk of complications and morbidity.    The differential diagnosis includes gastritis, gastroenteritis, hepatitis, pancreatitis  The initial plan is to obtain baseline labs, lipase, and CT imaging  Additional history obtained: Records reviewed - family medicine notes and video visit regarding patient's ongoing nausea, also reviewed plastic surgery notes  Initial Assessment:   Exam is overall unremarkable.  Abdomen is soft and nontender.  No distention of abdomen.  Heart rate and rhythm is normal.  Lungs are clear to auscultation.  Area of breast augmentation appears to be healing appropriately, no erythema, no swelling, or surgical site dehiscence  Independent ECG/labs interpretation:  The following labs were independently interpreted:  CBC without evidence of leukocytosis or anemia.  Lipase not indicative of pancreatitis.  Metabolic panel without major electrolyte disturbances.  She has normal renal function.  Elevated LFTs in the setting of no abdominal pain.  Added on monoscreen which was also negative.  Independent visualization and interpretation of imaging: I independently visualized the following imaging with scope of interpretation limited to determining acute life threatening conditions related to emergency care: CT chest abdomen pelvis, which revealed bilateral breast implants without fluid collection.  She does also have some CT findings suggestive of possible enteritis.  She also has prominence of the common  duct which may be related to her cholecystectomy.  Her total bilirubin was within normal range which does not suggest obstruction at this time.  I agree with radiologist interpretation.  Treatment and Reassessment: Patient's nausea was treated with Reglan which she states improved significantly.  She has not had any episodes of vomiting while being in the ED even prior to Reglan administration.  She has no abdominal pain and has had no episodes of diarrhea.  I requested consultation with GI who recommended outpatient follow-up.  Patient has had persistent nausea and vomiting since her surgery, I believe that she would benefit from GI follow-up given that her surgical sites appear unremarkable..  Patient in agreement with outpatient follow-up and states that she is ready for discharge home.  Disposition:   .The patient has been appropriately medically screened and/or stabilized in the ED. I have low suspicion for any other emergent medical condition which would require further screening, evaluation or treatment  in the ED or require inpatient management. At time of discharge the patient is hemodynamically stable and in no acute distress. I have discussed work-up results and diagnosis with patient and answered all questions. Patient is agreeable with discharge plan. We discussed strict return precautions for returning to the emergency department and they verbalized understanding.  Patient referred to GI for outpatient follow-up.           Final Clinical Impression(s) / ED Diagnoses Final diagnoses:  Nausea and vomiting, unspecified vomiting type  Elevated liver enzymes  Gastroenteritis    Rx / DC Orders ED Discharge Orders          Ordered    metoCLOPramide (REGLAN) 10 MG tablet  Every 6 hours        07/14/22 1630              Carlis Abbott [place], [place] 07/14/22 1942    Elgie Congo, MD 07/15/22 [id number]

## 2022-07-14 NOTE — Discharge Instructions (Addendum)
Thank you for allow me to be part of your care today.  Your workup was overall reassuring, however, you do have elevated liver enzymes that you need to follow-up with gastroenterology outpatient.  I have provided referral information, please give their office a call to set up an appointment.  I am also sending you home on Reglan to use for nausea and vomiting as needed.  Return to the ER if you have worsening of your symptoms or if you have any new concerns.

## 2022-07-15 LAB — HEPATITIS PANEL, ACUTE
HCV Ab: NONREACTIVE
Hep A IgM: NONREACTIVE
Hep B C IgM: NONREACTIVE
Hepatitis B Surface Ag: NONREACTIVE

## 2022-07-16 ENCOUNTER — Telehealth: Payer: Self-pay | Admitting: Surgical

## 2022-07-16 NOTE — Telephone Encounter (Signed)
Pt called to let Dr. Marla Roe and Catalina Antigua know she went to the ED on 2/19 and found her liver enzymes were very high.  Pt will be here 2/23 for post op but wanted inform providers so they can review her lab work. Patient request MyChart message due to her cell phone is not dependable.

## 2022-07-18 ENCOUNTER — Encounter: Payer: Self-pay | Admitting: Surgical

## 2022-07-18 ENCOUNTER — Ambulatory Visit (INDEPENDENT_AMBULATORY_CARE_PROVIDER_SITE_OTHER): Payer: 59 | Admitting: Surgical

## 2022-07-18 VITALS — BP 126/82 | HR 102

## 2022-07-18 DIAGNOSIS — Z1509 Genetic susceptibility to other malignant neoplasm: Secondary | ICD-10-CM

## 2022-07-18 DIAGNOSIS — Z1501 Genetic susceptibility to malignant neoplasm of breast: Secondary | ICD-10-CM

## 2022-07-18 DIAGNOSIS — Z8481 Family history of carrier of genetic disease: Secondary | ICD-10-CM

## 2022-07-18 DIAGNOSIS — Z9013 Acquired absence of bilateral breasts and nipples: Secondary | ICD-10-CM

## 2022-07-18 NOTE — Progress Notes (Signed)
Patient is a 32 year old female here for follow-up on her bilateral breast reconstruction.  She had tissue expanders exchanged for bilateral silicone breast implants with Dr. Marla Roe on 05/28/2022.  She is just over 7 weeks postop.  She reports that the left breast small opening has completely healed.  She does feel as if her right breast is larger than the left and is hopeful that this will continue to improve.  She notes that she is right-handed.   She reports that she was in the emergency room on 07/14/2022 for nausea and vomiting, subsequently had elevated LFTs.  She reports she is feeling much better at this time, but has some questions about her LFTs.   Chaperone present on exam on exam bilateral breast incisions are intact and well-healed.  There is no erythema or cellulitic changes of either breast.  I do not appreciate any subcutaneous fluid collection noted of either breast, however she does have some increased volume in the right breast, particularly in the upper pole.  There is no overlying skin changes.  There is no tenderness noted with palpation.  A/P:  Patient is a 32 year old female 7 weeks postop from exchange of bilateral breast tissue expanders to bilateral breast implants for breast reconstruction.  She has some mild asymmetries, particularly in the right breast upper pole, this is relatively unchanged from a few weeks ago, but there does appear to be more superior pole fullness.  I do not appreciate any fluid collections on exam, however there still may be some fluid undetectable on exam.  Discussed with patient in regard to her LFTs, recommend follow-up with PCP to recheck labs  We discussed in regards to the right breast, may benefit from ultrasound versus continued watchful waiting.  I discussed with the patient that I would discuss her case with Dr. Marla Roe.  Dr. Marla Roe and I discussed continued watchful waiting for the next 2 weeks and reevaluate, if she notices any  worsening between now and her follow-up appointment we can certainly see her sooner or do imaging.  Sent patient MyChart message as she reported her phone did not work at this time.  Pictures were obtained of the patient and placed in the chart with the patient's or guardian's permission.

## 2022-07-29 NOTE — Progress Notes (Unsigned)
Patient is a 32 year old female who underwent bilateral breast reconstruction.  Patient had tissue expanders exchanged for bilateral silicone breast implants with Dr. Marla Roe on 05/28/2022.  She currently has 590 cc Mentor smooth round ultrahigh profile gel implants in place bilaterally.  Patient presents to the clinic today for follow-up.  Patient was last seen in the clinic on 07/18/2022.  At this visit, patient reported that the small opening to her left breast had completely healed.  Patient at this visit also reported that she felt as if her right breast was larger than her left.  On exam, bilateral breast incisions were intact and well-healed.  There is no erythema or cellulitic changes to either breast.  There were no fluid collections palpated on exam.  There was some increased volume in the right breast particularly in the upper pole.  There were no overlying skin changes or tenderness with palpation.  The possibility of ultrasound versus continued watchful waiting was discussed with the patient.  The case was also discussed with Dr. Marla Roe.  The plan was for patient to continue watchful waiting for the next few weeks and if there were any worsening symptoms prior to her 2-week evaluation, she could come in earlier for imaging.  Today,

## 2022-07-31 ENCOUNTER — Encounter: Payer: Self-pay | Admitting: Student

## 2022-07-31 ENCOUNTER — Ambulatory Visit (INDEPENDENT_AMBULATORY_CARE_PROVIDER_SITE_OTHER): Payer: 59 | Admitting: Student

## 2022-07-31 VITALS — BP 126/72 | HR 80

## 2022-07-31 DIAGNOSIS — Z1509 Genetic susceptibility to other malignant neoplasm: Secondary | ICD-10-CM

## 2022-07-31 DIAGNOSIS — Z9013 Acquired absence of bilateral breasts and nipples: Secondary | ICD-10-CM

## 2022-07-31 DIAGNOSIS — Z1501 Genetic susceptibility to malignant neoplasm of breast: Secondary | ICD-10-CM

## 2022-08-01 ENCOUNTER — Encounter: Payer: 59 | Admitting: Surgical

## 2022-08-04 ENCOUNTER — Telehealth: Payer: Self-pay | Admitting: Student

## 2022-08-04 NOTE — Telephone Encounter (Signed)
Imaging can not get her in until 4/8 at 7:30 am.  Please call to let her know if you want her to r/s her upcoming visit on 4/21.

## 2022-08-11 NOTE — Telephone Encounter (Signed)
Call pt at work [phone] today. Is there another place besides [facility] we can send the order for: US BREAST COMPLETE UNI RIGHT INC AXILA  [facility] cannot do [facility] until 09/01/22.  Routing msg to Altha since [facility] is out today.

## 2022-08-12 NOTE — Telephone Encounter (Signed)
See msg claire about imaging

## 2022-08-12 NOTE — Telephone Encounter (Signed)
I spoke with the patient. She denies changes to her right breast. She denies any increased swelling, redness or tenderness. I discussed with the patient that we will plan to keep her ultrasound appointment for 4/8 and she can cancel her appointment this week. We will plan to have the patient return to the clinic for reevaluation first week of April. Patient was in agreement with this plan and stated that she will call and make the appointment. I instructed the patient to call back sooner if she experiences any changes to call us back sooner and we will see her sooner.

## 2022-08-14 ENCOUNTER — Encounter: Payer: 59 | Admitting: Student

## 2022-08-27 ENCOUNTER — Ambulatory Visit (INDEPENDENT_AMBULATORY_CARE_PROVIDER_SITE_OTHER): Payer: Self-pay | Admitting: Student

## 2022-08-27 DIAGNOSIS — Z91199 Patient's noncompliance with other medical treatment and regimen due to unspecified reason: Secondary | ICD-10-CM

## 2022-08-27 NOTE — Progress Notes (Signed)
No show

## 2022-09-01 ENCOUNTER — Other Ambulatory Visit: Payer: 59

## 2022-09-09 ENCOUNTER — Ambulatory Visit
Admission: RE | Admit: 2022-09-09 | Discharge: 2022-09-09 | Disposition: A | Discharge status: Home / Self Care | Payer: PRIVATE HEALTH INSURANCE | Source: Ambulatory Visit | Attending: Student | Admitting: Student

## 2022-09-09 DIAGNOSIS — Z9013 Acquired absence of bilateral breasts and nipples: Secondary | ICD-10-CM

## 2022-09-09 NOTE — Telephone Encounter (Signed)
Pt came by clinic to confirm she just left from having her MRI. Pt scheduled a telephone call visit with Margarette Asal PA-C., for Monday 09/15/22. Please call pt when you have the MRI Results and let her know if she needs to change her appt on Monday. Pt phone [phone]

## 2022-09-09 NOTE — Progress Notes (Signed)
Patient is s/p implant-based breast reconstruction. She was having some asymmetry, so an US of the R breast was ordered. Results do not indicate any fluid collections.   Attempted to call patient to discuss results, she did not answer. LVM to return call.

## 2022-09-15 ENCOUNTER — Ambulatory Visit: Payer: 59 | Admitting: Student

## 2022-09-18 ENCOUNTER — Ambulatory Visit (INDEPENDENT_AMBULATORY_CARE_PROVIDER_SITE_OTHER): Payer: 59 | Admitting: Student

## 2022-09-18 DIAGNOSIS — Z1501 Genetic susceptibility to malignant neoplasm of breast: Secondary | ICD-10-CM | POA: Diagnosis not present

## 2022-09-18 DIAGNOSIS — Z9013 Acquired absence of bilateral breasts and nipples: Secondary | ICD-10-CM | POA: Diagnosis not present

## 2022-09-18 DIAGNOSIS — Z1509 Genetic susceptibility to other malignant neoplasm: Secondary | ICD-10-CM

## 2022-09-18 NOTE — Progress Notes (Signed)
Referring Provider Raliegh Ip, DO [address] [place],  [place] [address]   CC:  Chief Complaint  Patient presents with   Follow-up      Gina Alvarez is an 32 y.o. female.  HPI: Patient is a 32 year old female who underwent bilateral breast reconstruction.  She underwent tissue expander exchange for bilateral silicone breast implants with Dr. Ulice Bold on 05/28/2022.  Patient had 590 cc Mentor smooth round ultrahigh profile gel implants placed.  Postoperatively, patient experienced some breast asymmetry with the right breast being slightly larger than the left breast.  An ultrasound was ordered for the patient to evaluate the area for the asymmetry.  Ultrasound of the right breast showed no fluid collection.  Patient presents to the clinic today for follow-up after her ultrasound.  Today, patient reports she is doing well.  She states that the right breast is approximately the same size.  She denies any other complaints or concerns.  Patient reports that she is not really bothered by her asymmetry.  We discussed the results of the ultrasound.  I discussed with her that there is no fluid around the implant.  Review of Systems General: No recent changes in health  Physical Exam    07/31/2022    8:47 AM 07/18/2022    8:42 AM 07/14/2022    1:05 PM  Vitals with BMI  Systolic 126 126 956  Diastolic 72 82 77  Pulse 80 102 98    General:  No acute distress,  Alert and oriented, Non-Toxic, Normal speech and affect On exam, patient is sitting upright in no acute distress.  Implants are in place bilaterally and are soft.  The right side is slightly bigger than the left.  It is soft.  There are no overlying skin changes.  There are no fluid collections palpated on exam.  Incisions are intact and healing well.  There are no signs of infection on exam.  Assessment/Plan  BRCA positive  Acquired absence of both breasts   I discussed with the patient that if she is bothered by her  breast asymmetry, she may follow-up with Dr. Ulice Bold to discuss possible interventions to improve the symmetry such as fat grafting.  Patient states that she is not really bothered by her asymmetry and does not want to pursue any further surgery at this time.  I discussed with the patient that she should continue to monitor her right breast closely and that if she has any changes to let us know.  Patient was in agreement and expressed understanding.  I discussed with the patient that she no longer has to wear compression and that she may transition into a regular bra without underwire.  I discussed with the patient that she is free of any restrictions in terms of her activities.  I also discussed with the patient that nipple tattooing could be an option for her if that is something she would like to pursue.  Patient states that she was thinking about nipple tattooing but was unsure.  I offered her consult for nipple tattooing.  She states that she will think about it and call back if she would like to schedule consult.  Patient to follow back up in 3 months for reevaluation.  Patient to also make her appointment for a year out for her yearly follow-up.  I discussed with the patient to call us back in the meantime if she has any questions or concerns.  Pictures were obtained of the patient and  placed in the chart with the patient's or guardian's permission.   Gina Alvarez 09/18/2022, 8:06 AM

## 2022-09-23 ENCOUNTER — Other Ambulatory Visit: Payer: Self-pay | Admitting: *Deleted

## 2022-09-23 ENCOUNTER — Encounter: Payer: Self-pay | Admitting: Family Medicine

## 2022-09-23 DIAGNOSIS — F320 Major depressive disorder, single episode, mild: Secondary | ICD-10-CM

## 2022-09-23 NOTE — Telephone Encounter (Signed)
Attempted to call and make appt. Letter sent.

## 2022-09-23 NOTE — Telephone Encounter (Signed)
Fax from [employer] ins Request for 90-d supply to local pharmacy Not appropriate Pt NTBS Gina Alvarez was to have 3 mos FU from [place]

## 2022-09-24 ENCOUNTER — Other Ambulatory Visit: Payer: Self-pay | Admitting: Family Medicine

## 2022-09-24 DIAGNOSIS — E039 Hypothyroidism, unspecified: Secondary | ICD-10-CM

## 2022-09-24 DIAGNOSIS — J3489 Other specified disorders of nose and nasal sinuses: Secondary | ICD-10-CM

## 2022-09-24 DIAGNOSIS — F320 Major depressive disorder, single episode, mild: Secondary | ICD-10-CM

## 2022-09-25 ENCOUNTER — Telehealth: Payer: Self-pay | Admitting: Family Medicine

## 2022-09-25 DIAGNOSIS — F320 Major depressive disorder, single episode, mild: Secondary | ICD-10-CM

## 2022-09-25 MED ORDER — DESVENLAFAXINE SUCCINATE ER 50 MG PO TB24: 50.0000 mg | ORAL_TABLET | Freq: Every day | ORAL | 0 refills | Status: DC

## 2022-09-25 NOTE — Telephone Encounter (Signed)
Patient aware rx sent to pharmacy.  

## 2022-09-25 NOTE — Telephone Encounter (Signed)
  Prescription Request  09/25/2022  Is this a "Controlled Substance" medicine?   Have you seen your PCP in the last 2 weeks? Made appt for 6/19  If YES, route message to pool  -  If NO, patient needs to be scheduled for appointment.  What is the name of the medication or equipment? desvenlafaxine (PRISTIQ) 50 MG 24 hr tablet  Have you contacted your pharmacy to request a refill? yes   Which pharmacy would you like this sent to? [facility]    Patient notified that their request is being sent to the clinical staff for review and that they should receive a response within 2 business days.

## 2022-11-12 ENCOUNTER — Ambulatory Visit (INDEPENDENT_AMBULATORY_CARE_PROVIDER_SITE_OTHER): Payer: 59 | Admitting: Family Medicine

## 2022-11-12 VITALS — BP 104/62 | HR 87 | Temp 98.7°F | Ht 59.0 in | Wt 149.0 lb

## 2022-11-12 DIAGNOSIS — E039 Hypothyroidism, unspecified: Secondary | ICD-10-CM

## 2022-11-12 DIAGNOSIS — F320 Major depressive disorder, single episode, mild: Secondary | ICD-10-CM | POA: Diagnosis not present

## 2022-11-12 DIAGNOSIS — Z9013 Acquired absence of bilateral breasts and nipples: Secondary | ICD-10-CM | POA: Diagnosis not present

## 2022-11-12 DIAGNOSIS — N939 Abnormal uterine and vaginal bleeding, unspecified: Secondary | ICD-10-CM

## 2022-11-12 MED ORDER — DESVENLAFAXINE SUCCINATE ER 100 MG PO TB24: 100.0000 mg | ORAL_TABLET | Freq: Every day | ORAL | 3 refills | Status: DC

## 2022-11-12 NOTE — Progress Notes (Signed)
Subjective: WU:JWJX/ AUB PCP: Raliegh Ip, DO BJY:[id number] Gina Alvarez is a 32 y.o. female presenting to clinic today for:  1. Mood She reports compliance with Pristiq 50mg .  She reports irritability.  Working a lot.  2. Hypothyroidism She was compliant with her thyroid medication.  No reports of tremor, heart palpitation but she does report abnormal uterine bleeding where she had a menstrual cycle roughly every 2 weeks.  She has copper IUD in place for contraception.  She denies any abnormal vaginal discharge, pelvic pain or other concerning symptoms or signs of STI.  3.  History of BRCA positive Status post double mastectomy and implant placement.  She is healed well from this and is doing well.  She has a follow-up soon with them.   ROS: Per HPI  No Known Allergies Past Medical History:  Diagnosis Date   Anxiety    Contraception management 08/19/2012   nexplanon inserted 08/19/12 in left arm   Depression    Irregular menstrual bleeding 09/26/2013   Nexplanon insertion 06/25/2017   06/25/17   Removed 08/25/17 d/t possible infeciton    Current Outpatient Medications:    azelastine (ASTELIN) 0.1 % nasal spray, Place 1 spray into both nostrils 2 (two) times daily., Disp: 30 mL, Rfl: 12   desvenlafaxine (PRISTIQ) 50 MG 24 hr tablet, Take 1 tablet (50 mg total) by mouth daily., Disp: 90 tablet, Rfl: 0   levocetirizine (XYZAL) 5 MG tablet, Take 1 tablet (5 mg total) by mouth every evening., Disp: 90 tablet, Rfl: 3   levothyroxine (SYNTHROID) 50 MCG tablet, Take 1 tablet (50 mcg total) by mouth daily., Disp: 90 tablet, Rfl: 0   metoCLOPramide (REGLAN) 10 MG tablet, Take 1 tablet (10 mg total) by mouth every 6 (six) hours., Disp: 30 tablet, Rfl: 0   prochlorperazine (COMPAZINE) 10 MG tablet, Take 5 mg by mouth every 6 (six) hours as needed., Disp: , Rfl:    promethazine (PHENERGAN) 12.5 MG tablet, Take 1 tablet (12.5 mg total) by mouth every 6 (six) hours as needed for nausea or  vomiting., Disp: 30 tablet, Rfl: 0   tiZANidine (ZANAFLEX) 4 MG tablet, Take 1 to 2 tablets by mouth every 8 hours as needed for muscle spasms., Disp: 90 tablet, Rfl: PRN Social History   Socioeconomic History   Marital status: Single    Spouse name: Not on file   Number of children: Not on file   Years of education: Not on file   Highest education level: Some college, no degree  Occupational History   Not on file  Tobacco Use   Smoking status: Former    Packs/day: 0.50    Years: 8.00    Additional pack years: 0.00    Total pack years: 4.00    Types: Cigarettes    Quit date: 12/26/2017    Years since quitting: 4.8   Smokeless tobacco: Never  Vaping Use   Vaping Use: Never used  Substance and Sexual Activity   Alcohol use: Never    Comment: maybe 1 drink per month   Drug use: Never   Sexual activity: Not Currently    Partners: Male    Birth control/protection: I.U.D.  Other Topics Concern   Not on file  Social History Narrative   Not on file   Social Determinants of Health   Financial Resource Strain: Medium Risk (11/12/2022)   Overall Financial Resource Strain (CARDIA)    Difficulty of Paying Living Expenses: Somewhat hard  Food Insecurity: No Food  Insecurity (11/12/2022)   Hunger Vital Sign    Worried About Running Out of Food in the Last Year: Never true    Ran Out of Food in the Last Year: Never true  Transportation Needs: No Transportation Needs (11/12/2022)   PRAPARE - Administrator, Civil Service (Medical): No    Lack of Transportation (Non-Medical): No  Physical Activity: Unknown (11/12/2022)   Exercise Vital Sign    Days of Exercise per Week: 0 days    Minutes of Exercise per Session: Not on file  Stress: No Stress Concern Present (11/12/2022)   [employer] of Occupational Health - Occupational Stress Questionnaire    Feeling of Stress : Not at all  Social Connections: Unknown (11/12/2022)   Social Connection and Isolation Panel [NHANES]     Frequency of Communication with Friends and Family: More than three times a week    Frequency of Social Gatherings with Friends and Family: Never    Attends Religious Services: Not on Marketing executive or Organizations: No    Attends Engineer, structural: Not on file    Marital Status: Living with partner  Intimate Partner Violence: Not on file   Family History  Problem Relation Age of Onset   Breast cancer Mother        dx 71-44; BRCA2+; stage III w/ chemo and radiation   Cancer Mother    Asthma Daughter    Lupus Paternal Grandmother        d. 16   Skin cancer Maternal Grandmother        "skin discoloration on arms" - not spotty   Mental illness Maternal Aunt     Objective: Office vital signs reviewed. BP 104/62   Pulse 87   Temp 98.7 F (37.1 C)   Ht 4\' 11"  (1.499 m)   Wt 149 lb (67.6 kg)   LMP 11/07/2022   SpO2 95%   BMI 30.09 kg/m   Physical Examination:  General: Awake, alert, well nourished, No acute distress HEENT: no goiter/ exophthalmos  Cardio: regular rate and rhythm, S1S2 heard, no murmurs appreciated Pulm: clear to auscultation bilaterally, no wheezes, rhonchi or rales; normal work of breathing on room air Psych: Mood stable, speech normal, affect appropriate     11/12/2022    3:30 PM 01/03/2022    1:59 PM 10/18/2021    4:28 PM  Depression screen PHQ 2/9  Decreased Interest 0 0 0  Down, Depressed, Hopeless 0 0 0  PHQ - 2 Score 0 0 0  Altered sleeping 0 0 0  Tired, decreased energy 0 0 0  Change in appetite 0 0 0  Feeling bad or failure about yourself  0 0 0  Trouble concentrating 0 0 0  Moving slowly or fidgety/restless 0 0 0  Suicidal thoughts 0 0 0  PHQ-9 Score 0 0 0  Difficult doing work/chores Not difficult at all  Not difficult at all      11/12/2022    3:33 PM 11/12/2022    3:30 PM 01/03/2022    1:59 PM 10/18/2021    4:28 PM  GAD 7 : Generalized Anxiety Score  Nervous, Anxious, on Edge 3 0 0 0  Control/stop worrying  0 0 0 0  Worry too much - different things 3 0 0 0  Trouble relaxing 0 0 0 0  Restless 3 0 0 0  Easily annoyed or irritable 0 0 0 0  Afraid - awful might  happen 3 0 0 0  Total GAD 7 Score 12 0 0 0  Anxiety Difficulty Very difficult Not difficult at all  Not difficult at all    Assessment/ Plan: 32 y.o. female   Depression, major, single episode, mild (HCC) - Plan: desvenlafaxine (PRISTIQ) 100 MG 24 hr tablet  Abnormal uterine bleeding (AUB) - Plan: TSH, T4, Free, Thyroid peroxidase antibody, Thyroglobulin antibody, CBC  Hypothyroidism, unspecified type - Plan: TSH, T4, Free, Thyroid peroxidase antibody, Thyroglobulin antibody  History of bilateral mastectomy  Questionable abnormal thyroid level causing abnormal uterine bleeding.  Not sure if now that she has had double mastectomy if she is a candidate for hormonal therapy that has estrogen in it.  Currently has a copper IUD in place.  We discussed that she has ongoing abnormal uterine bleeding that is not explained by metabolic abnormality, low threshold to have her see OB/GYN for transvaginal ultrasound and further recommendations.  Not sure if she would be a candidate for ablation?  Pristiq increased 100 mg daily.  Check for autoimmune thyroid antibodies  No orders of the defined types were placed in this encounter.  No orders of the defined types were placed in this encounter.    Raliegh Ip, DO [facility] [phone]

## 2022-11-13 LAB — CBC
Hematocrit: 36.7 % (ref 34.0–46.6)
Hemoglobin: 12.2 g/dL (ref 11.1–15.9)
MCH: 30.7 pg (ref 26.6–33.0)
MCHC: 33.2 g/dL (ref 31.5–35.7)
MCV: 92 fL (ref 79–97)
Platelets: 295 10*3/uL (ref 150–450)
RBC: 3.98 x10E6/uL (ref 3.77–5.28)
RDW: 12.4 % (ref 11.7–15.4)
WBC: 7.2 10*3/uL (ref 3.4–10.8)

## 2022-11-13 LAB — THYROID PEROXIDASE ANTIBODY: Thyroperoxidase Ab SerPl-aCnc: 14 IU/mL (ref 0–34)

## 2022-11-13 LAB — THYROGLOBULIN ANTIBODY: Thyroglobulin Antibody: <1 IU/mL (ref 0.0–0.9)

## 2022-11-13 LAB — T4, FREE: Free T4: 1.08 ng/dL (ref 0.82–1.77)

## 2022-11-13 LAB — TSH: TSH: 2.31 u[IU]/mL (ref 0.450–4.500)

## 2022-11-14 ENCOUNTER — Other Ambulatory Visit: Payer: Self-pay | Admitting: Family Medicine

## 2022-11-14 DIAGNOSIS — J3489 Other specified disorders of nose and nasal sinuses: Secondary | ICD-10-CM

## 2022-11-14 DIAGNOSIS — E039 Hypothyroidism, unspecified: Secondary | ICD-10-CM

## 2022-11-14 DIAGNOSIS — N939 Abnormal uterine and vaginal bleeding, unspecified: Secondary | ICD-10-CM

## 2022-12-10 ENCOUNTER — Encounter: Payer: Self-pay | Admitting: Adult Health

## 2022-12-10 ENCOUNTER — Ambulatory Visit (INDEPENDENT_AMBULATORY_CARE_PROVIDER_SITE_OTHER): Payer: 59 | Admitting: Adult Health

## 2022-12-10 VITALS — BP 100/66 | HR 82 | Ht 59.0 in | Wt 148.5 lb

## 2022-12-10 DIAGNOSIS — Z319 Encounter for procreative management, unspecified: Secondary | ICD-10-CM | POA: Diagnosis not present

## 2022-12-10 DIAGNOSIS — Z30432 Encounter for removal of intrauterine contraceptive device: Secondary | ICD-10-CM | POA: Diagnosis not present

## 2022-12-10 DIAGNOSIS — Z3202 Encounter for pregnancy test, result negative: Secondary | ICD-10-CM | POA: Diagnosis not present

## 2022-12-10 DIAGNOSIS — Z113 Encounter for screening for infections with a predominantly sexual mode of transmission: Secondary | ICD-10-CM

## 2022-12-10 DIAGNOSIS — N926 Irregular menstruation, unspecified: Secondary | ICD-10-CM

## 2022-12-10 LAB — POCT URINE PREGNANCY: Preg Test, Ur: NEGATIVE

## 2022-12-10 NOTE — Progress Notes (Signed)
Subjective:     Patient ID: Gina Alvarez, female   DOB: 09/24/1990, 32 y.o.   MRN: [id number]  HPI Gina Alvarez is a 32 year old white female with SO, [id number], in complaining of irregular bleeding with ParaGard will have a period then stop, bleed for a day and stop then have another period, it is light. She does want to get pregnant.  Last pap was negative  malignancy 10/18/21.  PCP is Dr Nadine Counts   Review of Systems +irregular bleeding with ParaGard IUD Reviewed past medical,surgical, social and family history. Reviewed medications and allergies.     Objective:   Physical Exam BP 100/66 (BP Location: Left Arm, Patient Position: Sitting, Cuff Size: Normal)   Pulse 82   Ht 4\' 11"  (1.499 m)   Wt 148 lb 8 oz (67.4 kg)   LMP 12/03/2022   BMI 29.99 kg/m  UPT is negative. Skin warm and dry.Pelvic: external genitalia is normal in appearance no lesions, vagina: mucous white discharge, no odor,urethra has no lesions or masses noted, cervix: bulbous,everted at os, IUD strings at os, and pt gave verbal permission to remove IUD, strings grasped with forceps and asked to cough, IUD easily removed, uterus: normal size, shape and contour, non tender, no masses felt, adnexa: no masses or tenderness noted. Bladder is non tender and no masses felt.     AA is 0 Fall risk is low    12/10/2022    9:03 AM 11/12/2022    3:30 PM 01/03/2022    1:59 PM  Depression screen PHQ 2/9  Decreased Interest 0 0 0  Down, Depressed, Hopeless 0 0 0  PHQ - 2 Score 0 0 0  Altered sleeping 0 0 0  Tired, decreased energy 0 0 0  Change in appetite 2 0 0  Feeling bad or failure about yourself  0 0 0  Trouble concentrating 0 0 0  Moving slowly or fidgety/restless 0 0 0  Suicidal thoughts 0 0 0  PHQ-9 Score 2 0 0  Difficult doing work/chores  Not difficult at all        12/10/2022    9:03 AM 11/12/2022    3:33 PM 11/12/2022    3:30 PM 01/03/2022    1:59 PM  GAD 7 : Generalized Anxiety Score  Nervous, Anxious, on  Edge 0 3 0 0  Control/stop worrying 0 0 0 0  Worry too much - different things 0 3 0 0  Trouble relaxing 0 0 0 0  Restless 0 3 0 0  Easily annoyed or irritable 0 0 0 0  Afraid - awful might happen 0 3 0 0  Total GAD 7 Score 0 12 0 0  Anxiety Difficulty  Very difficult Not difficult at all       Upstream - 12/10/22 0919       Pregnancy Intention Screening   Does the patient want to become pregnant in the next year? Yes    Does the patient's partner want to become pregnant in the next year? Yes    Would the patient like to discuss contraceptive options today? No      Contraception Wrap Up   Current Method IUD or IUS    End Method Pregnant/Seeking Pregnancy            Examination chaperoned by Malachy Mood LPN  Assessment:     1. Pregnancy examination or test, negative result - POCT urine pregnancy  2. Irregular bleeding Irregular bleeding with IUD  3. Patient  desires pregnancy Start taking PNV with folic acid, OTC is fine Discussed timing of sex Can take 6-18 months of active trying or can happen first unprotected sex  4. Encounter for IUD removal IUD easily removed   5. Screening examination for STD (sexually transmitted disease) Urine sent for GC/CHL  - GC/Chlamydia Probe Amp     Plan:     Follow up prn

## 2022-12-11 ENCOUNTER — Other Ambulatory Visit: Payer: Self-pay | Admitting: Family Medicine

## 2022-12-13 LAB — GC/CHLAMYDIA PROBE AMP
Chlamydia trachomatis, NAA: NEGATIVE
Neisseria Gonorrhoeae by PCR: NEGATIVE

## 2022-12-15 ENCOUNTER — Telehealth: Payer: Self-pay | Admitting: Plastic Surgery

## 2022-12-15 NOTE — Telephone Encounter (Signed)
Pt called to cancel her appt scheduled for 12/16/22 @ 9am due to a work conflict.  She said that she would call another time to reschedule.

## 2022-12-16 ENCOUNTER — Ambulatory Visit: Payer: 59 | Admitting: Plastic Surgery

## 2022-12-19 ENCOUNTER — Other Ambulatory Visit: Payer: Self-pay | Admitting: Adult Health

## 2022-12-19 MED ORDER — PRENATAL PLUS 27-1 MG PO TABS: 1.0000 | ORAL_TABLET | Freq: Every day | ORAL | 12 refills | Status: DC

## 2022-12-19 NOTE — Progress Notes (Signed)
RX PNV

## 2023-01-22 ENCOUNTER — Encounter: Payer: Self-pay | Admitting: Family

## 2023-01-22 ENCOUNTER — Other Ambulatory Visit: Payer: 59

## 2023-01-22 ENCOUNTER — Telehealth (INDEPENDENT_AMBULATORY_CARE_PROVIDER_SITE_OTHER): Payer: 59 | Admitting: Family

## 2023-01-22 DIAGNOSIS — N912 Amenorrhea, unspecified: Secondary | ICD-10-CM

## 2023-01-22 NOTE — Addendum Note (Signed)
Addended by: Jannifer Rodney A on: 01/22/2023 04:12 PM   Modules accepted: Orders

## 2023-01-22 NOTE — Progress Notes (Signed)
Virtual Visit Consent   MECHELE Alvarez, you are scheduled for a virtual visit with a [facility] provider today. Just as with appointments in the office, your consent must be obtained to participate. Your consent will be active for this visit and any virtual visit you may have with one of our providers in the next 365 days. If you have a MyChart account, a copy of this consent can be sent to you electronically.  As this is a virtual visit, video technology does not allow for your provider to perform a traditional examination. This may limit your provider's ability to fully assess your condition. If your provider identifies any concerns that need to be evaluated in person or the need to arrange testing (such as labs, EKG, etc.), we will make arrangements to do so. Although advances in technology are sophisticated, we cannot ensure that it will always work on either your end or our end. If the connection with a video visit is poor, the visit may have to be switched to a telephone visit. With either a video or telephone visit, we are not always able to ensure that we have a secure connection.  By engaging in this virtual visit, you consent to the provision of healthcare and authorize for your insurance to be billed (if applicable) for the services provided during this visit. Depending on your insurance coverage, you may receive a charge related to this service.  I need to obtain your verbal consent now. Are you willing to proceed with your visit today? Gina Alvarez has provided verbal consent on 01/22/2023 for a virtual visit (video or telephone). Jannifer Rodney, FNP  Date: 01/22/2023 4:03 PM  Virtual Visit via Video Note   I, Jannifer Rodney, connected with  Gina Alvarez  ([id number], 09-27-1990) on 01/22/23 at  5:45 PM EDT by a video-enabled telemedicine application and verified that I am speaking with the correct person using two identifiers.  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 The patient expressed understanding and agreed to proceed.    History of Present Illness: Gina Alvarez is a 32 y.o. who identifies as a female who was assigned female at birth, and is being seen today for positive pregnancy test at home. She reports her last menstrual cycle was 11/24/22.   HPI: HPI  Problems:  Patient Active Problem List   Diagnosis Date Noted   Encounter for IUD removal 12/10/2022   Patient desires pregnancy 12/10/2022   Irregular bleeding 12/10/2022   Pregnancy examination or test, negative result 12/10/2022   Screening examination for STD (sexually transmitted disease) 12/10/2022   Acquired absence of breast 06/10/2022   Breast cancer (HCC) 01/09/2022   BRCA positive    Calculus of gallbladder without cholecystitis without obstruction 08/30/2020   FHx: BRCA gene positive 10/21/2016   Urinary retention 06/14/2012    Allergies: No Known Allergies Medications:  Current Outpatient Medications:    prenatal vitamin w/FE, FA (PRENATAL 1 + 1) 27-1 MG TABS tablet, Take 1 tablet by mouth daily at 12 noon., Disp: 30 tablet, Rfl: 12   azelastine (ASTELIN) 0.1 % nasal spray, Place 1 spray into both nostrils 2 (two) times daily., Disp: 30 mL, Rfl: 12   desvenlafaxine (PRISTIQ) 100 MG 24 hr tablet, Take 1 tablet (100 mg total) by mouth daily., Disp: 90 tablet, Rfl: 3  levocetirizine (XYZAL) 5 MG tablet, TAKE ONE TABLET BY MOUTH EVERY DAY, Disp: 90 tablet, Rfl: 1   levothyroxine (SYNTHROID) 50 MCG tablet, Take 1 tablet (50 mcg total) by mouth daily., Disp: 90 tablet, Rfl: 3   metoCLOPramide (REGLAN) 10 MG tablet, Take 1 tablet (10 mg total) by mouth every 6 (six) hours. (Patient taking differently: Take 10 mg by mouth.), Disp: 30 tablet, Rfl: 0   PARAGARD INTRAUTERINE COPPER IU, by Intrauterine  route., Disp: , Rfl:    promethazine (PHENERGAN) 12.5 MG tablet, Take 1 tablet (12.5 mg total) by mouth every 6 (six) hours as needed for nausea or vomiting., Disp: 30 tablet, Rfl: 0   tiZANidine (ZANAFLEX) 4 MG tablet, Take 1 to 2 tablets by mouth every 8 hours as needed for muscle spasms., Disp: 90 tablet, Rfl: PRN  Observations/Objective: Patient is well-developed, well-nourished in no acute distress.  Resting comfortably   Head is normocephalic, atraumatic.  No labored breathing.  Speech is clear and coherent with logical content.  Patient is alert and oriented at baseline.    Assessment and Plan: 1. Amenorrhea - Pregnancy, urine  Pt will come and get pregnancy test tomorrow morning Start prenatal  Avoid any OTC meds Has GYN already established  Follow up as needed   Follow Up Instructions: I discussed the assessment and treatment plan with the patient. The patient was provided an opportunity to ask questions and all were answered. The patient agreed with the plan and demonstrated an understanding of the instructions.  A copy of instructions were sent to the patient via MyChart unless otherwise noted below.    The patient was advised to call back or seek an in-person evaluation if the symptoms worsen or if the condition fails to improve as anticipated.  Time:  I spent 6 minutes with the patient via telehealth technology discussing the above problems/concerns.    Jannifer Rodney, FNP

## 2023-01-23 ENCOUNTER — Telehealth: Payer: Self-pay | Admitting: Family Medicine

## 2023-01-23 ENCOUNTER — Other Ambulatory Visit: Payer: 59

## 2023-01-23 DIAGNOSIS — N912 Amenorrhea, unspecified: Secondary | ICD-10-CM

## 2023-01-23 LAB — PREGNANCY, URINE: Preg Test, Ur: POSITIVE — AB

## 2023-01-23 NOTE — Telephone Encounter (Signed)
Called no answer left VM

## 2023-01-23 NOTE — Telephone Encounter (Signed)
Patient aware it was +

## 2023-01-27 ENCOUNTER — Other Ambulatory Visit: Payer: Self-pay | Admitting: Adult Health

## 2023-01-27 MED ORDER — PROMETHAZINE HCL 12.5 MG PO TABS: 12.5000 mg | ORAL_TABLET | Freq: Four times a day (QID) | ORAL | 2 refills | Status: DC | PRN

## 2023-01-27 NOTE — Progress Notes (Signed)
Rx phenergan 

## 2023-02-06 ENCOUNTER — Other Ambulatory Visit: Payer: Self-pay | Admitting: Adult Health

## 2023-02-06 MED ORDER — ONDANSETRON HCL 4 MG PO TABS: 4.0000 mg | ORAL_TABLET | Freq: Three times a day (TID) | ORAL | 1 refills | Status: DC | PRN

## 2023-02-23 ENCOUNTER — Other Ambulatory Visit: Payer: Self-pay | Admitting: Obstetrics & Gynecology

## 2023-02-23 DIAGNOSIS — O3680X0 Pregnancy with inconclusive fetal viability, not applicable or unspecified: Secondary | ICD-10-CM

## 2023-02-24 ENCOUNTER — Other Ambulatory Visit: Payer: 59

## 2023-03-02 ENCOUNTER — Ambulatory Visit: Payer: 59

## 2023-03-02 DIAGNOSIS — Z3481 Encounter for supervision of other normal pregnancy, first trimester: Secondary | ICD-10-CM

## 2023-03-02 DIAGNOSIS — Z3A10 10 weeks gestation of pregnancy: Secondary | ICD-10-CM | POA: Diagnosis not present

## 2023-03-02 DIAGNOSIS — O3680X0 Pregnancy with inconclusive fetal viability, not applicable or unspecified: Secondary | ICD-10-CM

## 2023-03-02 NOTE — Progress Notes (Signed)
[place] 10+4 wks,single IUP,FHR 164 bpm,CRL 40.17 mm,normal ovaries

## 2023-03-11 ENCOUNTER — Encounter: Payer: Self-pay | Admitting: Women's Health

## 2023-03-11 DIAGNOSIS — Z3490 Encounter for supervision of normal pregnancy, unspecified, unspecified trimester: Secondary | ICD-10-CM | POA: Insufficient documentation

## 2023-03-11 HISTORY — DX: Encounter for supervision of normal pregnancy, unspecified, unspecified trimester: Z34.90

## 2023-03-16 ENCOUNTER — Other Ambulatory Visit: Payer: Self-pay | Admitting: Obstetrics & Gynecology

## 2023-03-16 DIAGNOSIS — Z3682 Encounter for antenatal screening for nuchal translucency: Secondary | ICD-10-CM

## 2023-03-17 ENCOUNTER — Other Ambulatory Visit: Payer: 59

## 2023-03-17 ENCOUNTER — Encounter: Payer: 59 | Admitting: *Deleted

## 2023-03-17 ENCOUNTER — Ambulatory Visit (INDEPENDENT_AMBULATORY_CARE_PROVIDER_SITE_OTHER): Payer: Medicaid Other | Admitting: Women's Health

## 2023-03-17 ENCOUNTER — Encounter: Payer: Self-pay | Admitting: Women's Health

## 2023-03-17 VITALS — BP 100/64 | HR 74 | Wt 146.0 lb

## 2023-03-17 DIAGNOSIS — Z1509 Genetic susceptibility to other malignant neoplasm: Secondary | ICD-10-CM | POA: Diagnosis not present

## 2023-03-17 DIAGNOSIS — Z3682 Encounter for antenatal screening for nuchal translucency: Secondary | ICD-10-CM | POA: Diagnosis not present

## 2023-03-17 DIAGNOSIS — Z1332 Encounter for screening for maternal depression: Secondary | ICD-10-CM | POA: Diagnosis not present

## 2023-03-17 DIAGNOSIS — Z3481 Encounter for supervision of other normal pregnancy, first trimester: Secondary | ICD-10-CM

## 2023-03-17 DIAGNOSIS — Z3480 Encounter for supervision of other normal pregnancy, unspecified trimester: Secondary | ICD-10-CM

## 2023-03-17 DIAGNOSIS — Z3A12 12 weeks gestation of pregnancy: Secondary | ICD-10-CM

## 2023-03-17 DIAGNOSIS — E039 Hypothyroidism, unspecified: Secondary | ICD-10-CM | POA: Diagnosis not present

## 2023-03-17 DIAGNOSIS — F418 Other specified anxiety disorders: Secondary | ICD-10-CM

## 2023-03-17 DIAGNOSIS — Z1501 Genetic susceptibility to malignant neoplasm of breast: Secondary | ICD-10-CM | POA: Diagnosis not present

## 2023-03-17 DIAGNOSIS — Z31430 Encounter of female for testing for genetic disease carrier status for procreative management: Secondary | ICD-10-CM | POA: Diagnosis not present

## 2023-03-17 MED ORDER — SERTRALINE HCL 25 MG PO TABS: 25.0000 mg | ORAL_TABLET | Freq: Every day | ORAL | 3 refills | Status: DC

## 2023-03-17 MED ORDER — DOXYLAMINE-PYRIDOXINE 10-10 MG PO TBEC: DELAYED_RELEASE_TABLET | ORAL | 6 refills | Status: DC

## 2023-03-17 NOTE — Patient Instructions (Signed)
Gina Alvarez, thank you for choosing our office today! We appreciate the opportunity to meet your healthcare needs. You may receive a short survey by mail, e-mail, or through [employer]. If you are happy with your care we would appreciate if you could take just a few minutes to complete the survey questions. We read all of your comments and take your feedback very seriously. Thank you again for choosing our office.  Center for [employer] [facility] Team at [facility] 2  [facility] ([address] [place], [place] [address]) Entrance C, located off of [address] Free 24/7 valet parking   Nausea & Vomiting Have saltine crackers or pretzels by your bed and eat a few bites before you raise your head out of bed in the morning Eat small frequent meals throughout the day instead of large meals Drink plenty of fluids throughout the day to stay hydrated, just don't drink a lot of fluids with your meals.  This can make your stomach fill up faster making you feel sick Do not brush your teeth right after you eat Products with real ginger are good for nausea, like ginger ale and ginger hard candy Make sure it says made with real ginger! Sucking on sour candy like lemon heads is also good for nausea If your prenatal vitamins make you nauseated, take them at night so you will sleep through the nausea Sea Bands If you feel like you need medicine for the nausea & vomiting please let us know If you are unable to keep any fluids or food down please let us know   Constipation Drink plenty of fluid, preferably water, throughout the day Eat foods high in fiber such as fruits, vegetables, and grains Exercise, such as walking, is a good way to keep your bowels regular Drink warm fluids, especially warm prune juice, or decaf coffee Eat a 1/2 cup of real oatmeal (not instant), 1/2 cup applesauce, and 1/2-1 cup warm prune juice every day If needed, you may take Colace (docusate sodium) stool softener  once or twice a day to help keep the stool soft.  If you still are having problems with constipation, you may take Miralax once daily as needed to help keep your bowels regular.   Home Blood Pressure Monitoring for Patients   Your provider has recommended that you check your blood pressure (BP) at least once a week at home. If you do not have a blood pressure cuff at home, one will be provided for you. Contact your provider if you have not received your monitor within 1 week.   Helpful Tips for Accurate Home Blood Pressure Checks  Don't smoke, exercise, or drink caffeine 30 minutes before checking your BP Use the restroom before checking your BP (a full bladder can raise your pressure) Relax in a comfortable upright chair Feet on the ground Left arm resting comfortably on a flat surface at the level of your heart Legs uncrossed Back supported Sit quietly and don't talk Place the cuff on your bare arm Adjust snuggly, so that only two fingertips can fit between your skin and the top of the cuff Check 2 readings separated by at least one minute Keep a log of your BP readings For a visual, please reference this diagram: http://ccnc.care/bpdiagram  Provider Name: [facility]     Phone: [phone]  Zone 1: ALL CLEAR  Continue to monitor your symptoms:  BP reading is less than 140 (top number) or less than 90 (bottom  number)  No right upper stomach pain No headaches or seeing spots No feeling nauseated or throwing up No swelling in face and hands  Zone 2: CAUTION Call your doctor's office for any of the following:  BP reading is greater than 140 (top number) or greater than 90 (bottom number)  Stomach pain under your ribs in the middle or right side Headaches or seeing spots Feeling nauseated or throwing up Swelling in face and hands  Zone 3: EMERGENCY  Seek immediate medical care if you have any of the following:  BP reading is greater than160 (top number) or greater than  110 (bottom number) Severe headaches not improving with Tylenol Serious difficulty catching your breath Any worsening symptoms from Zone 2    First Trimester of Pregnancy The first trimester of pregnancy is from week 1 until the end of week 12 (months 1 through 3). A week after a sperm fertilizes an egg, the egg will implant on the wall of the uterus. This embryo will begin to develop into a baby. Genes from you and your partner are forming the baby. The female genes determine whether the baby is a boy or a girl. At 6-8 weeks, the eyes and face are formed, and the heartbeat can be seen on ultrasound. At the end of 12 weeks, all the baby's organs are formed.  Now that you are pregnant, you will want to do everything you can to have a healthy baby. Two of the most important things are to get good prenatal care and to follow your health care provider's instructions. Prenatal care is all the medical care you receive before the baby's birth. This care will help prevent, find, and treat any problems during the pregnancy and childbirth. BODY CHANGES Your body goes through many changes during pregnancy. The changes vary from woman to woman.  You may gain or lose a couple of pounds at first. You may feel sick to your stomach (nauseous) and throw up (vomit). If the vomiting is uncontrollable, call your health care provider. You may tire easily. You may develop headaches that can be relieved by medicines approved by your health care provider. You may urinate more often. Painful urination may mean you have a bladder infection. You may develop heartburn as a result of your pregnancy. You may develop constipation because certain hormones are causing the muscles that push waste through your intestines to slow down. You may develop hemorrhoids or swollen, bulging veins (varicose veins). Your breasts may begin to grow larger and become tender. Your nipples may stick out more, and the tissue that surrounds them  (areola) may become darker. Your gums may bleed and may be sensitive to brushing and flossing. Dark spots or blotches (chloasma, mask of pregnancy) may develop on your face. This will likely fade after the baby is born. Your menstrual periods will stop. You may have a loss of appetite. You may develop cravings for certain kinds of food. You may have changes in your emotions from day to day, such as being excited to be pregnant or being concerned that something may go wrong with the pregnancy and baby. You may have more vivid and strange dreams. You may have changes in your hair. These can include thickening of your hair, rapid growth, and changes in texture. Some women also have hair loss during or after pregnancy, or hair that feels dry or thin. Your hair will most likely return to normal after your baby is born. WHAT TO EXPECT AT YOUR PRENATAL  VISITS During a routine prenatal visit: You will be weighed to make sure you and the baby are growing normally. Your blood pressure will be taken. Your abdomen will be measured to track your baby's growth. The fetal heartbeat will be listened to starting around week 10 or 12 of your pregnancy. Test results from any previous visits will be discussed. Your health care provider may ask you: How you are feeling. If you are feeling the baby move. If you have had any abnormal symptoms, such as leaking fluid, bleeding, severe headaches, or abdominal cramping. If you have any questions. Other tests that may be performed during your first trimester include: Blood tests to find your blood type and to check for the presence of any previous infections. They will also be used to check for low iron levels (anemia) and Rh antibodies. Later in the pregnancy, blood tests for diabetes will be done along with other tests if problems develop. Urine tests to check for infections, diabetes, or protein in the urine. An ultrasound to confirm the proper growth and development  of the baby. An amniocentesis to check for possible genetic problems. Fetal screens for spina bifida and Down syndrome. You may need other tests to make sure you and the baby are doing well. HOME CARE INSTRUCTIONS  Medicines Follow your health care provider's instructions regarding medicine use. Specific medicines may be either safe or unsafe to take during pregnancy. Take your prenatal vitamins as directed. If you develop constipation, try taking a stool softener if your health care provider approves. Diet Eat regular, well-balanced meals. Choose a variety of foods, such as meat or vegetable-based protein, fish, milk and low-fat dairy products, vegetables, fruits, and whole grain breads and cereals. Your health care provider will help you determine the amount of weight gain that is right for you. Avoid raw meat and uncooked cheese. These carry germs that can cause birth defects in the baby. Eating four or five small meals rather than three large meals a day may help relieve nausea and vomiting. If you start to feel nauseous, eating a few soda crackers can be helpful. Drinking liquids between meals instead of during meals also seems to help nausea and vomiting. If you develop constipation, eat more high-fiber foods, such as fresh vegetables or fruit and whole grains. Drink enough fluids to keep your urine clear or pale yellow. Activity and Exercise Exercise only as directed by your health care provider. Exercising will help you: Control your weight. Stay in shape. Be prepared for labor and delivery. Experiencing pain or cramping in the lower abdomen or low back is a good sign that you should stop exercising. Check with your health care provider before continuing normal exercises. Try to avoid standing for long periods of time. Move your legs often if you must stand in one place for a long time. Avoid heavy lifting. Wear low-heeled shoes, and practice good posture. You may continue to have sex  unless your health care provider directs you otherwise. Relief of Pain or Discomfort Wear a good support bra for breast tenderness.   Take warm sitz baths to soothe any pain or discomfort caused by hemorrhoids. Use hemorrhoid cream if your health care provider approves.   Rest with your legs elevated if you have leg cramps or low back pain. If you develop varicose veins in your legs, wear support hose. Elevate your feet for 15 minutes, 3-4 times a day. Limit salt in your diet. Prenatal Care Schedule your prenatal visits by the  twelfth week of pregnancy. They are usually scheduled monthly at first, then more often in the last 2 months before delivery. Write down your questions. Take them to your prenatal visits. Keep all your prenatal visits as directed by your health care provider. Safety Wear your seat belt at all times when driving. Make a list of emergency phone numbers, including numbers for family, friends, the hospital, and police and fire departments. General Tips Ask your health care provider for a referral to a local prenatal education class. Begin classes no later than at the beginning of month 6 of your pregnancy. Ask for help if you have counseling or nutritional needs during pregnancy. Your health care provider can offer advice or refer you to specialists for help with various needs. Do not use hot tubs, steam rooms, or saunas. Do not douche or use tampons or scented sanitary pads. Do not cross your legs for long periods of time. Avoid cat litter boxes and soil used by cats. These carry germs that can cause birth defects in the baby and possibly loss of the fetus by miscarriage or stillbirth. Avoid all smoking, herbs, alcohol, and medicines not prescribed by your health care provider. Chemicals in these affect the formation and growth of the baby. Schedule a dentist appointment. At home, brush your teeth with a soft toothbrush and be gentle when you floss. SEEK MEDICAL CARE IF:   You have dizziness. You have mild pelvic cramps, pelvic pressure, or nagging pain in the abdominal area. You have persistent nausea, vomiting, or diarrhea. You have a bad smelling vaginal discharge. You have pain with urination. You notice increased swelling in your face, hands, legs, or ankles. SEEK IMMEDIATE MEDICAL CARE IF:  You have a fever. You are leaking fluid from your vagina. You have spotting or bleeding from your vagina. You have severe abdominal cramping or pain. You have rapid weight gain or loss. You vomit blood or material that looks like coffee grounds. You are exposed to [place] measles and have never had them. You are exposed to fifth disease or chickenpox. You develop a severe headache. You have shortness of breath. You have any kind of trauma, such as from a fall or a car accident. Document Released: 05/06/2001 Document Revised: 09/26/2013 Document Reviewed: 03/22/2013 [facility] Patient Information 2015 [place], [place]. This information is not intended to replace advice given to you by your health care provider. Make sure you discuss any questions you have with your health care provider.

## 2023-03-17 NOTE — Progress Notes (Signed)
[place] 12+5 wks,measurements c/w dates,anterior placenta,FHR 160 bpm,normal ovaries,CRL 69.36 mm,NT 2.1 mm,NB present

## 2023-03-17 NOTE — Progress Notes (Signed)
INITIAL OBSTETRICAL VISIT Patient name: Gina Alvarez MRN [id number]  Date of birth: April 04, 1991 Chief Complaint:   Initial Prenatal Visit (constipation)  History of Present Illness:   Gina Alvarez is a 32 y.o. [phone] Caucasian female at [redacted]w[redacted]d by LMP c/w u/s at 10 weeks with an Estimated Date of Delivery: 09/24/23 being seen today for her initial obstetrical visit.   Patient's last menstrual period was 12/18/2022. Her obstetrical history is significant for  SAB x 1, term uncomplicated SVB x 2 . Mom dx w/ breast cancer in 14s, is BRCA+, pt also tested BRCA+ and had prophylactic bilateral mastectomy w/ reconstruction and was told needs hysterectomy by 32yo-wants as soon as possible after baby Hypothyroidism- on synthroid prior to pregnancy, stopped, wasn't sure if ok to take Dep/anx- was on pristiq, stopped and went through w/drawal sx, feels she needs to be on something but doesn't want baby to have w/drawal sx, did well on zoloft in past, would like to try again. Is ok w/ IBH referral.    Today she reports nausea and constipation, taking zofran daily.  Last pap 10/18/21. Results were: NILM w/ HRHPV not done     03/17/2023   10:08 AM 12/10/2022    9:03 AM 11/12/2022    3:30 PM 01/03/2022    1:59 PM 10/18/2021    4:28 PM  Depression screen PHQ 2/9  Decreased Interest 2 0 0 0 0  Down, Depressed, Hopeless 2 0 0 0 0  PHQ - 2 Score 4 0 0 0 0  Altered sleeping 0 0 0 0 0  Tired, decreased energy 2 0 0 0 0  Change in appetite 0 2 0 0 0  Feeling bad or failure about yourself  1 0 0 0 0  Trouble concentrating 0 0 0 0 0  Moving slowly or fidgety/restless 0 0 0 0 0  Suicidal thoughts 0 0 0 0 0  PHQ-9 Score 7 2 0 0 0  Difficult doing work/chores   Not difficult at all  Not difficult at all        03/17/2023   10:08 AM 12/10/2022    9:03 AM 11/12/2022    3:33 PM 11/12/2022    3:30 PM  GAD 7 : Generalized Anxiety Score  Nervous, Anxious, on Edge 2 0 3 0  Control/stop worrying 1 0 0  0  Worry too much - different things 1 0 3 0  Trouble relaxing 1 0 0 0  Restless 0 0 3 0  Easily annoyed or irritable 3 0 0 0  Afraid - awful might happen 0 0 3 0  Total GAD 7 Score 8 0 12 0  Anxiety Difficulty   Very difficult Not difficult at all     Review of Systems:   Pertinent items are noted in HPI Denies cramping/contractions, leakage of fluid, vaginal bleeding, abnormal vaginal discharge w/ itching/odor/irritation, headaches, visual changes, shortness of breath, chest pain, abdominal pain, severe nausea/vomiting, or problems with urination or bowel movements unless otherwise stated above.  Pertinent History Reviewed:  Reviewed past medical,surgical, social, obstetrical and family history.  Reviewed problem list, medications and allergies. OB History  Gravida Para Term Preterm AB Living  4 2 2  0 1 2  SAB IAB Ectopic Multiple Live Births  1 0 0 0 2    # Outcome Date GA Lbr Len/2nd Weight Sex Type Anes PTL Lv  4 Current           3 Term  05/15/17 [redacted]w[redacted]d 05:20 / 02:35 6 lb 11.6 oz (3.05 kg) F Vag-Spont EPI N LIV     Birth Comments: caput  2 SAB 05/2015          1 Term 06/12/12 [redacted]w[redacted]d 38:15 / 01:55 7 lb 0.4 oz (3.185 kg) F Vag-Vacuum EPI N LIV   Physical Assessment:   Vitals:   03/17/23 0945  BP: 100/64  Pulse: 74  Weight: 146 lb (66.2 kg)  Body mass index is 29.49 kg/m.       Physical Examination:  General appearance - well appearing, and in no distress  Mental status - alert, oriented to person, place, and time  Psych:  She has a normal mood and affect  Skin - warm and dry, normal color, no suspicious lesions noted  Chest - effort normal, all lung fields clear to auscultation bilaterally  Heart - normal rate and regular rhythm  Abdomen - soft, nontender  Extremities:  No swelling or varicosities noted  Thin prep pap is not done   Chaperone: N/A    TODAY'S NT [place] 12+5 wks,measurements c/w dates,anterior placenta,FHR 160 bpm,normal ovaries,CRL 69.36 mm,NT 2.1  mm,NB present   No results found for this or any previous visit (from the past 24 hour(s)).  Assessment & Plan:  1) Low-Risk Pregnancy [id number] at [redacted]w[redacted]d with an Estimated Date of Delivery: 09/24/23   2) Initial OB visit  3) BRCA+ s/p bilateral mastectomy w/ reconstruction> needs hysterectomy by 32yo, wants asap pp  4) Hypothyroidism> was on 50mg  synthroid, stopped w/ +PT, TSH today then will resume dosing  5) Dep/anx> on pristiq prior, had w/drawal sx, wants to try zoloft again, rx 25mg , understands it can take few weeks to notice improvement. IBH referral ordered  6) Nausea> rx diclegis, stop zofran (constipated)  7) Constipation> gave printed prevention/relief measures   Meds:  Meds ordered this encounter  Medications   Doxylamine-Pyridoxine (DICLEGIS) 10-10 MG TBEC    Sig: 2 tabs q hs, if sx persist add 1 tab q am on day 3, if sx persist add 1 tab q afternoon on day 4    Dispense:  100 tablet    Refill:  6   sertraline (ZOLOFT) 25 MG tablet    Sig: Take 1 tablet (25 mg total) by mouth daily.    Dispense:  30 tablet    Refill:  3    Initial labs obtained Continue prenatal vitamins Reviewed n/v relief measures and warning s/s to report Reviewed recommended weight gain based on pre-gravid BMI Encouraged well-balanced diet Genetic & carrier screening discussed: requests Panorama, NT/IT, and Horizon  Ultrasound discussed; fetal survey: requested CCNC completed> form faxed if has or is planning to apply for medicaid The nature of [employer] for [employer] with multiple MDs and other Advanced Practice Providers was explained to patient; also emphasized that fellows, residents, and students are part of our team. Does not have home bp cuff. Office bp cuff given: yes. Rx sent: no. Check bp weekly, let us know if consistently >140/90.   Follow-up: Return in about 4 weeks (around 04/14/2023) for LROB, 2nd IT, CNM, in person; then 7wks from now anatomy u/s and LROB w/  CNM.   Orders Placed This Encounter  Procedures   Urine Culture   GC/Chlamydia Probe Amp   Integrated 1   PANORAMA PRENATAL TEST   CBC/D/Plt+RPR+Rh+ABO+RubIgG...   TSH   T4, free   Amb ref to Integrated Behavioral Health    Cheral Marker CNM,  WHNP-BC 03/17/2023 10:50 AM

## 2023-03-18 ENCOUNTER — Encounter: Payer: Self-pay | Admitting: Women's Health

## 2023-03-18 LAB — TSH: TSH: 1.32 u[IU]/mL (ref 0.450–4.500)

## 2023-03-18 LAB — T4, FREE: Free T4: 0.95 ng/dL (ref 0.82–1.77)

## 2023-03-18 NOTE — Addendum Note (Signed)
Addended by: Moss Mc on: 03/18/2023 12:34 PM   Modules accepted: Orders

## 2023-03-19 LAB — INTEGRATED 1
Crown Rump Length: 69.4 mm
Gest. Age on Collection Date: 13 wk
PAPP-A Value: 1886.8 ng/mL
Race: 1
Sonographer ID#: 309760
Sonographer ID#: 32.8 a
Weight: 146 [lb_av]
Weight: 2.1 mm

## 2023-03-19 LAB — CBC/D/PLT+RPR+RH+ABO+RUBIGG...
Antibody Screen: NEGATIVE
Basophils Absolute: 0 10*3/uL (ref 0.0–0.2)
Basos: 0 %
EOS (ABSOLUTE): 0 10*3/uL (ref 0.0–0.4)
Eos: 0 %
HCV Ab: NONREACTIVE
HIV Screen 4th Generation wRfx: NONREACTIVE
Hematocrit: 37.6 % (ref 34.0–46.6)
Hemoglobin: 12.7 g/dL (ref 11.1–15.9)
Hepatitis B Surface Ag: NEGATIVE
Immature Grans (Abs): 0 10*3/uL (ref 0.0–0.1)
Immature Granulocytes: 0 %
Lymphocytes Absolute: 1.8 10*3/uL (ref 0.7–3.1)
Lymphs: 22 %
MCH: 32.7 pg (ref 26.6–33.0)
MCHC: 33.8 g/dL (ref 31.5–35.7)
MCV: 97 fL (ref 79–97)
Monocytes Absolute: 0.3 10*3/uL (ref 0.1–0.9)
Monocytes: 4 %
Neutrophils Absolute: 6 10*3/uL (ref 1.4–7.0)
Neutrophils: 74 %
Platelets: 298 10*3/uL (ref 150–450)
RBC: 3.88 x10E6/uL (ref 3.77–5.28)
RDW: 12.7 % (ref 11.7–15.4)
RPR Ser Ql: NONREACTIVE
Rh Factor: POSITIVE
Rubella Antibodies, IGG: 1.13 {index} (ref >0.99)
WBC: 8.1 10*3/uL (ref 3.4–10.8)

## 2023-03-19 LAB — GC/CHLAMYDIA PROBE AMP
Chlamydia trachomatis, NAA: NEGATIVE
Neisseria Gonorrhoeae by PCR: NEGATIVE

## 2023-03-19 LAB — URINE CULTURE

## 2023-03-19 LAB — HCV INTERPRETATION

## 2023-03-24 LAB — PANORAMA PRENATAL TEST FULL PANEL:PANORAMA TEST PLUS 5 ADDITIONAL MICRODELETIONS: FETAL FRACTION: 15.5

## 2023-03-25 LAB — HORIZON CUSTOM: REPORT SUMMARY: NEGATIVE

## 2023-04-06 NOTE — BH Specialist Note (Signed)
Integrated Behavioral Health via Telemedicine Visit  04/20/2023 EMREY BERO [id number]  Number of Integrated Behavioral Health Clinician visits: 1- Initial Visit  Session Start time: 1415   Session End time: 1441  Total time in minutes: 26   Referring Provider: Shawna Clamp, CNM Patient/Family location: Home [facility] Provider location: [facility]  All persons participating in visit: Patient Gina Alvarez and [facility] Geanna Divirgilio   Types of Service: Individual psychotherapy and Video visit  I connected with Haze Rushing and/or Dorathy Daft  n/a  via  Telephone or Video Enabled Telemedicine Application  (Video is Caregility application) and verified that I am speaking with the correct person using two identifiers. Discussed confidentiality: Yes   I discussed the limitations of telemedicine and the availability of in person appointments.  Discussed there is a possibility of technology failure and discussed alternative modes of communication if that failure occurs.  I discussed that engaging in this telemedicine visit, they consent to the provision of behavioral healthcare and the services will be billed under their insurance.  Patient and/or legal guardian expressed understanding and consented to Telemedicine visit: Yes   Presenting Concerns: Patient and/or family reports the following symptoms/concerns: Increased irritability and managing a more difficult pregnancy than previous, attributed to ongoing nausea; taking Zoloft to manage symptoms of depression and anxiety; good support at home; supportive workplace.  Duration of problem: Current pregnancy; Severity of problem: moderate  Patient and/or Family's Strengths/Protective Factors: Social connections, Social and Emotional competence, Concrete supports in place (healthy food, safe environments, etc.), Sense of purpose, and Physical Health (exercise, healthy diet,  medication compliance, etc.)  Goals Addressed: Patient will:  Maintain reduced symptoms of: anxiety and depression   Increase knowledge and/or ability of: healthy habits   Demonstrate ability to: Increase healthy adjustment to current life circumstances  Progress towards Goals: Ongoing  Interventions: Interventions utilized:  Psychoeducation and/or Health Education, Link to [employer], and Supportive Reflection Standardized Assessments completed: Not Needed  Patient and/or Family Response: Patient agrees with treatment plan.   Assessment: Patient currently experiencing Adjustment disorder with mixed anxious and depressed mood.   Patient may benefit from psychoeducation and brief therapeutic interventions regarding maintaining reduced symptoms of depression and anxiety.  Plan: Follow up with behavioral health clinician on : Call Jeraldean Wechter at [phone], as needed. Behavioral recommendations:  -Continue prioritizing healthy self-care (regular meals, adequate rest; allowing practical help from supportive friends and family) as needed -Continue taking Zoloft and prenatal vitamin daily as prescribed; discuss any increase in Zoloft at upcoming medical appointment -Read through information on After Visit Summary; use as needed  Referral(s): Integrated [employer] (In Clinic)  I discussed the assessment and treatment plan with the patient and/or parent/guardian. They were provided an opportunity to ask questions and all were answered. They agreed with the plan and demonstrated an understanding of the instructions.   They were advised to call back or seek an in-person evaluation if the symptoms worsen or if the condition fails to improve as anticipated.  Rae Lips, LCSW     03/17/2023   10:08 AM 12/10/2022    9:03 AM 11/12/2022    3:30 PM 01/03/2022    1:59 PM 10/18/2021    4:28 PM  Depression screen PHQ 2/9  Decreased Interest 2 0 0 0 0  Down,  Depressed, Hopeless 2 0 0 0 0  PHQ - 2 Score 4 0 0 0 0  Altered sleeping 0  0 0 0 0  Tired, decreased energy 2 0 0 0 0  Change in appetite 0 2 0 0 0  Feeling bad or failure about yourself  1 0 0 0 0  Trouble concentrating 0 0 0 0 0  Moving slowly or fidgety/restless 0 0 0 0 0  Suicidal thoughts 0 0 0 0 0  PHQ-9 Score 7 2 0 0 0  Difficult doing work/chores   Not difficult at all  Not difficult at all      03/17/2023   10:08 AM 12/10/2022    9:03 AM 11/12/2022    3:33 PM 11/12/2022    3:30 PM  GAD 7 : Generalized Anxiety Score  Nervous, Anxious, on Edge 2 0 3 0  Control/stop worrying 1 0 0 0  Worry too much - different things 1 0 3 0  Trouble relaxing 1 0 0 0  Restless 0 0 3 0  Easily annoyed or irritable 3 0 0 0  Afraid - awful might happen 0 0 3 0  Total GAD 7 Score 8 0 12 0  Anxiety Difficulty   Very difficult Not difficult at all

## 2023-04-14 ENCOUNTER — Encounter: Payer: Self-pay | Admitting: Women's Health

## 2023-04-14 ENCOUNTER — Ambulatory Visit (INDEPENDENT_AMBULATORY_CARE_PROVIDER_SITE_OTHER): Payer: Medicaid Other | Admitting: Women's Health

## 2023-04-14 VITALS — BP 98/66 | HR 92 | Wt 146.0 lb

## 2023-04-14 DIAGNOSIS — Z3A16 16 weeks gestation of pregnancy: Secondary | ICD-10-CM

## 2023-04-14 DIAGNOSIS — Z1379 Encounter for other screening for genetic and chromosomal anomalies: Secondary | ICD-10-CM

## 2023-04-14 DIAGNOSIS — Z23 Encounter for immunization: Secondary | ICD-10-CM

## 2023-04-14 DIAGNOSIS — Z363 Encounter for antenatal screening for malformations: Secondary | ICD-10-CM

## 2023-04-14 DIAGNOSIS — Z3482 Encounter for supervision of other normal pregnancy, second trimester: Secondary | ICD-10-CM

## 2023-04-14 DIAGNOSIS — Z3480 Encounter for supervision of other normal pregnancy, unspecified trimester: Secondary | ICD-10-CM

## 2023-04-14 NOTE — Patient Instructions (Addendum)
Gina Alvarez, thank you for choosing our office today! We appreciate the opportunity to meet your healthcare needs. You may receive a short survey by mail, e-mail, or through [employer]. If you are happy with your care we would appreciate if you could take just a few minutes to complete the survey questions. We read all of your comments and take your feedback very seriously. Thank you again for choosing our office.  Center for [employer] Healthcare Team at [facility] at [facility] ([address] [place], [place] [address]) Entrance C, located off of [address] [employer] valet parking   For colds Humidifier and saline nasal spray for nasal congestion Regular robitussin, cough drops for cough Warm salt water gargles for sore throat Mucinex with lots of water to help you cough up the mucous in your chest if needed Drink plenty of fluids and stay hydrated! Wash your hands frequently. Call if you are not improving by 7-10 days.    Go to [email or web address] to register for FREE online childbirth classes  Call the office [phone]) or go to [facility] if: You begin to severe cramping Your water breaks.  Sometimes it is a big gush of fluid, sometimes it is just a trickle that keeps getting your panties wet or running down your legs You have vaginal bleeding.  It is normal to have a small amount of spotting if your cervix was checked.   [facility] Pediatricians/Family Doctors [facility]): [address] Colette Ribas, [phone]           [facility]: [address], [phone]                [facility]): [address], [phone] (call to ask if accepting patients) [facility] Department: [address], [place], [phone]    [facility] Pediatricians/Family Doctors [facility]): [phone] [address], [phone] [facility]: [address] [place],  [phone] [facility]: [address], [phone]  [facility] Doctors  [facility]): [phone] [facility]: [address], [phone]   [facility] Doctors [facility]: [address], [phone]  [facility] Doctors  [facility]: [phone], [phone]  Home Blood Pressure Monitoring for Patients   Your provider has recommended that you check your blood pressure (BP) at least once a week at home. If you do not have a blood pressure cuff at home, one will be provided for you. Contact your provider if you have not received your monitor within 1 week.   Helpful Tips for Accurate Home Blood Pressure Checks  Don't smoke, exercise, or drink caffeine 30 minutes before checking your BP Use the restroom before checking your BP (a full bladder can raise your pressure) Relax in a comfortable upright chair Feet on the ground Left arm resting comfortably on a flat surface at the level of your heart Legs uncrossed Back supported Sit quietly and don't talk Place the cuff on your bare arm Adjust snuggly, so that only two fingertips can fit between your skin and the top of the cuff Check 2 readings separated by at least one minute Keep a log of your BP readings For a visual, please reference this diagram: http://ccnc.care/bpdiagram  Provider Name: [facility]     Phone: [phone]  Zone 1: ALL CLEAR  Continue to monitor your  symptoms:  BP reading is less than 140 (top number) or less than 90 (bottom number)  No right upper stomach pain No headaches or seeing spots No feeling nauseated or throwing up No swelling in face and hands  Zone 2: CAUTION Call your doctor's office for any of the following:  BP reading is greater than 140 (top number) or greater than 90 (bottom number)  Stomach pain under your ribs in the middle or right side Headaches or  seeing spots Feeling nauseated or throwing up Swelling in face and hands  Zone 3: EMERGENCY  Seek immediate medical care if you have any of the following:  BP reading is greater than160 (top number) or greater than 110 (bottom number) Severe headaches not improving with Tylenol Serious difficulty catching your breath Any worsening symptoms from Zone 2     Second Trimester of Pregnancy The second trimester is from week 14 through week 27 (months 4 through 6). The second trimester is often a time when you feel your best. Your body has adjusted to being pregnant, and you begin to feel better physically. Usually, morning sickness has lessened or quit completely, you may have more energy, and you may have an increase in appetite. The second trimester is also a time when the fetus is growing rapidly. At the end of the sixth month, the fetus is about 9 inches long and weighs about 1 pounds. You will likely begin to feel the baby move (quickening) between 16 and 20 weeks of pregnancy. Body changes during your second trimester Your body continues to go through many changes during your second trimester. The changes vary from woman to woman. Your weight will continue to increase. You will notice your lower abdomen bulging out. You may begin to get stretch [place] on your hips, abdomen, and breasts. You may develop headaches that can be relieved by medicines. The medicines should be approved by your health care provider. You may urinate more often because the fetus is pressing on your bladder. You may develop or continue to have heartburn as a result of your pregnancy. You may develop constipation because certain hormones are causing the muscles that push waste through your intestines to slow down. You may develop hemorrhoids or swollen, bulging veins (varicose veins). You may have back pain. This is caused by: Weight gain. Pregnancy hormones that are relaxing the joints in your pelvis. A shift in  weight and the muscles that support your balance. Your breasts will continue to grow and they will continue to become tender. Your gums may bleed and may be sensitive to brushing and flossing. Dark spots or blotches (chloasma, mask of pregnancy) may develop on your face. This will likely fade after the baby is born. A dark line from your belly button to the pubic area (linea nigra) may appear. This will likely fade after the baby is born. You may have changes in your hair. These can include thickening of your hair, rapid growth, and changes in texture. Some women also have hair loss during or after pregnancy, or hair that feels dry or thin. Your hair will most likely return to normal after your baby is born.  What to expect at prenatal visits During a routine prenatal visit: You will be weighed to make sure you and the fetus are growing normally. Your blood pressure will be taken. Your abdomen will be measured to track your baby's growth. The fetal heartbeat will be listened to. Any test results from the previous visit will be  discussed.  Your health care provider may ask you: How you are feeling. If you are feeling the baby move. If you have had any abnormal symptoms, such as leaking fluid, bleeding, severe headaches, or abdominal cramping. If you are using any tobacco products, including cigarettes, chewing tobacco, and electronic cigarettes. If you have any questions.  Other tests that may be performed during your second trimester include: Blood tests that check for: Low iron levels (anemia). High blood sugar that affects pregnant women (gestational diabetes) between 23 and 28 weeks. Rh antibodies. This is to check for a protein on red blood cells (Rh factor). Urine tests to check for infections, diabetes, or protein in the urine. An ultrasound to confirm the proper growth and development of the baby. An amniocentesis to check for possible genetic problems. Fetal screens for spina  bifida and Down syndrome. HIV (human immunodeficiency virus) testing. Routine prenatal testing includes screening for HIV, unless you choose not to have this test.  Follow these instructions at home: Medicines Follow your health care provider's instructions regarding medicine use. Specific medicines may be either safe or unsafe to take during pregnancy. Take a prenatal vitamin that contains at least 600 micrograms (mcg) of folic acid. If you develop constipation, try taking a stool softener if your health care provider approves. Eating and drinking Eat a balanced diet that includes fresh fruits and vegetables, whole grains, good sources of protein such as meat, eggs, or tofu, and low-fat dairy. Your health care provider will help you determine the amount of weight gain that is right for you. Avoid raw meat and uncooked cheese. These carry germs that can cause birth defects in the baby. If you have low calcium intake from food, talk to your health care provider about whether you should take a daily calcium supplement. Limit foods that are high in fat and processed sugars, such as fried and sweet foods. To prevent constipation: Drink enough fluid to keep your urine clear or pale yellow. Eat foods that are high in fiber, such as fresh fruits and vegetables, whole grains, and beans. Activity Exercise only as directed by your health care provider. Most women can continue their usual exercise routine during pregnancy. Try to exercise for 30 minutes at least 5 days a week. Stop exercising if you experience uterine contractions. Avoid heavy lifting, wear low heel shoes, and practice good posture. A sexual relationship may be continued unless your health care provider directs you otherwise. Relieving pain and discomfort Wear a good support bra to prevent discomfort from breast tenderness. Take warm sitz baths to soothe any pain or discomfort caused by hemorrhoids. Use hemorrhoid cream if your health  care provider approves. Rest with your legs elevated if you have leg cramps or low back pain. If you develop varicose veins, wear support hose. Elevate your feet for 15 minutes, 3-4 times a day. Limit salt in your diet. Prenatal Care Write down your questions. Take them to your prenatal visits. Keep all your prenatal visits as told by your health care provider. This is important. Safety Wear your seat belt at all times when driving. Make a list of emergency phone numbers, including numbers for family, friends, the hospital, and police and fire departments. General instructions Ask your health care provider for a referral to a local prenatal education class. Begin classes no later than the beginning of month 6 of your pregnancy. Ask for help if you have counseling or nutritional needs during pregnancy. Your health care provider can offer advice  or refer you to specialists for help with various needs. Do not use hot tubs, steam rooms, or saunas. Do not douche or use tampons or scented sanitary pads. Do not cross your legs for long periods of time. Avoid cat litter boxes and soil used by cats. These carry germs that can cause birth defects in the baby and possibly loss of the fetus by miscarriage or stillbirth. Avoid all smoking, herbs, alcohol, and unprescribed drugs. Chemicals in these products can affect the formation and growth of the baby. Do not use any products that contain nicotine or tobacco, such as cigarettes and e-cigarettes. If you need help quitting, ask your health care provider. Visit your dentist if you have not gone yet during your pregnancy. Use a soft toothbrush to brush your teeth and be gentle when you floss. Contact a health care provider if: You have dizziness. You have mild pelvic cramps, pelvic pressure, or nagging pain in the abdominal area. You have persistent nausea, vomiting, or diarrhea. You have a bad smelling vaginal discharge. You have pain when you  urinate. Get help right away if: You have a fever. You are leaking fluid from your vagina. You have spotting or bleeding from your vagina. You have severe abdominal cramping or pain. You have rapid weight gain or weight loss. You have shortness of breath with chest pain. You notice sudden or extreme swelling of your face, hands, ankles, feet, or legs. You have not felt your baby move in over an hour. You have severe headaches that do not go away when you take medicine. You have vision changes. Summary The second trimester is from week 14 through week 27 (months 4 through 6). It is also a time when the fetus is growing rapidly. Your body goes through many changes during pregnancy. The changes vary from woman to woman. Avoid all smoking, herbs, alcohol, and unprescribed drugs. These chemicals affect the formation and growth your baby. Do not use any tobacco products, such as cigarettes, chewing tobacco, and e-cigarettes. If you need help quitting, ask your health care provider. Contact your health care provider if you have any questions. Keep all prenatal visits as told by your health care provider. This is important. This information is not intended to replace advice given to you by your health care provider. Make sure you discuss any questions you have with your health care provider. Document Released: 05/06/2001 Document Revised: 10/18/2015 Document Reviewed: 07/13/2012 Elsevier Interactive Patient Education  2017 [employer].

## 2023-04-14 NOTE — Progress Notes (Signed)
LOW-RISK PREGNANCY VISIT Patient name: Gina Alvarez MRN [id number]  Date of birth: [birth year] Chief Complaint:   Routine Prenatal Visit (2nd IT today!!!)  History of Present Illness:   Gina Alvarez is a 32 y.o. [phone] female at [redacted]w[redacted]d with an Estimated Date of Delivery: 09/24/23 being seen today for ongoing management of a low-risk pregnancy.   Today she reports  still has n/v, diclegis and zofran help, is keeping food down, declines phenergan . Contractions: Not present. Vag. Bleeding: None.   . denies leaking of fluid.     03/17/2023   10:08 AM 12/10/2022    9:03 AM 11/12/2022    3:30 PM 01/03/2022    1:59 PM 10/18/2021    4:28 PM  Depression screen PHQ 2/9  Decreased Interest 2 0 0 0 0  Down, Depressed, Hopeless 2 0 0 0 0  PHQ - 2 Score 4 0 0 0 0  Altered sleeping 0 0 0 0 0  Tired, decreased energy 2 0 0 0 0  Change in appetite 0 2 0 0 0  Feeling bad or failure about yourself  1 0 0 0 0  Trouble concentrating 0 0 0 0 0  Moving slowly or fidgety/restless 0 0 0 0 0  Suicidal thoughts 0 0 0 0 0  PHQ-9 Score 7 2 0 0 0  Difficult doing work/chores   Not difficult at all  Not difficult at all        03/17/2023   10:08 AM 12/10/2022    9:03 AM 11/12/2022    3:33 PM 11/12/2022    3:30 PM  GAD 7 : Generalized Anxiety Score  Nervous, Anxious, on Edge 2 0 3 0  Control/stop worrying 1 0 0 0  Worry too much - different things 1 0 3 0  Trouble relaxing 1 0 0 0  Restless 0 0 3 0  Easily annoyed or irritable 3 0 0 0  Afraid - awful might happen 0 0 3 0  Total GAD 7 Score 8 0 12 0  Anxiety Difficulty   Very difficult Not difficult at all      Review of Systems:   Pertinent items are noted in HPI Denies abnormal vaginal discharge w/ itching/odor/irritation, headaches, visual changes, shortness of breath, chest pain, abdominal pain, severe nausea/vomiting, or problems with urination or bowel movements unless otherwise stated above. Pertinent History Reviewed:  Reviewed past  medical,surgical, social, obstetrical and family history.  Reviewed problem list, medications and allergies. Physical Assessment:   Vitals:   04/14/23 1543  BP: 98/66  Pulse: 92  Weight: 146 lb (66.2 kg)  Body mass index is 29.49 kg/m.        Physical Examination:   General appearance: Well appearing, and in no distress  Mental status: Alert, oriented to person, place, and time  Skin: Warm & dry  Cardiovascular: Normal heart rate noted  Respiratory: Normal respiratory effort, no distress  Abdomen: Soft, gravid, nontender  Pelvic: Cervical exam deferred         Extremities: Edema: None  Fetal Status: Fetal Heart Rate (bpm): 155        Chaperone: N/A   No results found for this or any previous visit (from the past 24 hour(s)).  Assessment & Plan:  1) Low-risk pregnancy [id number] at [redacted]w[redacted]d with an Estimated Date of Delivery: 09/24/23   2) Hypothyroid, no meds, last TSH wnl  3) BRCA+ s/p bilateral ppx mastectomy/implants- needs hysterectomy pp   Meds: No orders  of the defined types were placed in this encounter.  Labs/procedures today: flu shot and 2nd IT  Plan:  Continue routine obstetrical care  Next visit: prefers will be in person for u/s     Reviewed: Preterm labor symptoms and general obstetric precautions including but not limited to vaginal bleeding, contractions, leaking of fluid and fetal movement were reviewed in detail with the patient.  All questions were answered. Does have home bp cuff. Office bp cuff given: not applicable. Check bp weekly, let us know if consistently >140 and/or >90.  Follow-up: Return for As scheduled.  Future Appointments  Date Time Provider Department Center  04/20/2023  2:15 PM [facility] CL[facility]IC[facility]  05/05/2023  9:15 AM [facility] [place] [facility] None  05/05/2023 10:10 AM Cheral Marker, CNM [facility]  09/18/2023  8:15 AM [place], Alena Bills, DO [facility] None    Orders Placed This Encounter  Procedures    US OB Comp + 14 Wk   Flu vaccine trivalent PF, 6mos and older(Flulaval,Afluria,Fluarix,Fluzone)   INTEGRATED 2   Cheral Marker CNM, [facility] 04/14/2023 4:08 PM

## 2023-04-16 LAB — INTEGRATED 2
AFP MoM: 1.16
Alpha-Fetoprotein: 41.8 ng/mL
Crown Rump Length: 69.4 mm
DIA MoM: 1.37
DIA Value: 223.9 pg/mL
Estriol, Unconjugated: 1.23 ng/mL
Gest. Age on Collection Date: 13 wk
Gestational Age: 17 wk
Maternal Age at EDD: 32.8 a
Nuchal Translucency (NT): 2.1 mm
Nuchal Translucency MoM: 1.44
Number of Fetuses: 1
PAPP-A MoM: 1.54
PAPP-A Value: 1886.8 ng/mL
Sonographer ID#: 309760
Test Results:: NEGATIVE
Weight: 146 [lb_av]
Weight: 146 [lb_av]
hCG MoM: 1.12
hCG Value: 33.6 [IU]/mL
uE3 MoM: 1.17

## 2023-04-20 ENCOUNTER — Ambulatory Visit: Payer: Medicaid Other | Admitting: Clinical

## 2023-04-20 DIAGNOSIS — F4323 Adjustment disorder with mixed anxiety and depressed mood: Secondary | ICD-10-CM | POA: Diagnosis not present

## 2023-04-20 NOTE — Patient Instructions (Signed)
Center for [facility] at [facility] [address] [place], [place] [address] [phone] (main office) [phone] [facility] office)  [email or web address]       BRAINSTORMING  Develop a Plan Goals: Provide a way to start conversation about your new life with a baby Assist parents in recognizing and using resources within their reach Help pave the way before birth for an easier period of transition afterwards.  Make a list of the following information to keep in a central location: Full name of Mom and Partner: _____________________________________________ Baby's full name and Date of Birth: ___________________________________________ Home Address: ___________________________________________________________ ________________________________________________________________________ Home Phone: ____________________________________________________________ Parents' cell numbers: _____________________________________________________ ________________________________________________________________________ Name and contact info for OB: ______________________________________________ Name and contact info for Pediatrician:________________________________________ Contact info for Lactation Consultants: ________________________________________  REST and SLEEP *You each need at least 4-5 hours of uninterrupted sleep every day. Write specific names and contact information.* How are you going to rest in the postpartum period? While partner's home? When partner returns to work? When you both return to work? Where will your baby sleep? Who is available to help during the day? Evening? Night? Who could move in for a period to help support you? What are some ideas to help you get enough  sleep? __________________________________________________________________________________________________________________________________________________________________________________________________________________________________________ NUTRITIOUS FOOD AND DRINK *Plan for meals before your baby is born so you can have healthy food to eat during the immediate postpartum period.* Who will look after breakfast? Lunch? Dinner? List names and contact information. Brainstorm quick, healthy ideas for each meal. What can you do before baby is born to prepare meals for the postpartum period? How can others help you with meals? Which grocery stores provide online shopping and delivery? Which restaurants offer take-out or delivery options? ______________________________________________________________________________________________________________________________________________________________________________________________________________________________________________________________________________________________________________________________________________________________________________________________________  CARE FOR MOM *It's important that mom is cared for and pampered in the postpartum period. Remember, the most important ways new mothers need care are: sleep, nutrition, gentle exercise, and time off.* Who can come take care of mom during this period? Make a list of people with their contact information. List some activities that make you feel cared for, rested, and energized? Who can make sure you have opportunities to do these things? Does mom have a space of her very own within your home that's just for her? Make a "[facility]" where she can be comfortable, rest, and renew herself  daily. ______________________________________________________________________________________________________________________________________________________________________________________________________________________________________________________________________________________________________________________________________________________________________________________________________    CARE FOR AND FEEDING BABY *Knowledgeable and encouraging people will offer the best support with regard to feeding your baby.* Educate yourself and choose the best feeding option for your baby. Make a list of people who will guide, support, and be a resource for you as your care for and feed your baby. (Friends that have breastfed or are currently breastfeeding, lactation consultants, breastfeeding support groups, etc.) Consider a postpartum doula. (These websites can give you information: dona.org & https://shea.org/) Seek out local breastfeeding resources like the breastfeeding support group at [employer] or [employer]. ______________________________________________________________________________________________________________________________________________________________________________________________________________________________________________________________________________________________________________________________________________________________________________________________________  Judson Roch AND ERRANDS Who can help with a thorough cleaning before baby is born? Make a list of people who will help with housekeeping and chores, like laundry, light cleaning, dishes, bathrooms, etc. Who can run some errands for you? What can you do to make sure you are stocked with basic supplies before baby is born? Who is going to do the  shopping? ______________________________________________________________________________________________________________________________________________________________________________________________________________________________________________________________________________________________________________________________________________________________________________________________________     Family Adjustment *Nurture yourselves.it helps parents be more loving and allows for better bonding with their child.* What sorts of things do  you and partner enjoy doing together? Which activities help you to connect and strengthen your relationship? Make a list of those things. Make a list of people whom you trust to care for your baby so you can have some time together as a couple. What types of things help partner feel connected to Mom? Make a list. What needs will partner have in order to bond with baby? Other children? Who will care for them when you go into labor and while you are in the hospital? Think about what the needs of your older children might be. Who can help you meet those needs? In what ways are you helping them prepare for bringing baby home? List some specific strategies you have for family adjustment. _______________________________________________________________________________________________________________________________________________________________________________________________________________________________________________________________________________________________________________________________________________  SUPPORT *Someone who can empathize with experiences normalizes your problems and makes them more bearable.* Make a list of other friends, neighbors, and/or co-workers you know with infants (and small children, if applicable) with whom you can connect. Make a list of local or online support groups, mom groups, etc. in which you can be  involved. ______________________________________________________________________________________________________________________________________________________________________________________________________________________________________________________________________________________________________________________________________________________________________________________________________  Childcare Plans Investigate and plan for childcare if mom is returning to work. Talk about mom's concerns about her transition back to work. Talk about partner's concerns regarding this transition.  Mental Health *Your mental health is one of the highest priorities for a pregnant or postpartum mom.* 1 in 5 women experience anxiety and/or depression from the time of conception through the first year after birth. Postpartum Mood Disorders are the #1 complication of pregnancy and childbirth and the suffering experienced by these mothers is not necessary! These illnesses are temporary and respond well to treatment, which often includes self-care, social support, talk therapy, and medication when needed. Women experiencing anxiety and depression often say things like: "I'm supposed to be happy.why do I feel so sad?", "Why can't I snap out of it?", "I'm having thoughts that scare me." There is no need to be embarrassed if you are feeling these symptoms: Overwhelmed, anxious, angry, sad, guilty, irritable, hopeless, exhausted but can't sleep You are NOT alone. You are NOT to blame. With help, you WILL be well. Where can I find help? Medical professionals such as your OB, midwife, gynecologist, family practitioner, primary care provider, pediatrician, or mental health providers; [facility] support groups: Feelings After Birth, Breastfeeding Support Group, Baby and Me Group, and Fit 4 Two exercise classes. You have permission to ask for help. It will confirm your feelings, validate your experiences,  share/learn coping strategies, and gain support and encouragement as you heal. You are important! BRAINSTORM Make a list of local resources, including resources for mom and for partner. Identify support groups. Identify people to call late at night - include names and contact info. Talk with partner about perinatal mood and anxiety disorders. Talk with your OB, midwife, and doula about baby blues and about perinatal mood and anxiety disorders. Talk with your pediatrician about perinatal mood and anxiety disorders.   Support & Sanity Savers   What do you really need?  Basics In preparing for a new baby, many expectant parents spend hours shopping for baby clothes, decorating the nursery, and deciding which car seat to buy. Yet most don't think much about what the reality of parenting a newborn will be like, and what they need to make it through that. So, here is the advice of experienced parents. We know you'll read this, and think "they're exaggerating, I don't really need that." Just trust [place] on these, OK?  Plan for all of this, and if it turns out you don't need it, come back and teach [place] how you did it!  Must-Haves (Once baby's survival needs are met, make sure you attend to your own survival needs!) Sleep An average newborn sleeps 16-18 hours per day, over 6-7 sleep periods, rarely more than three hours at a time. It is normal and healthy for a newborn to wake throughout the night... but really hard on parents!! Naps. Prioritize sleep above any responsibilities like: cleaning house, visiting friends, running errands, etc.  Sleep whenever baby sleeps. If you can't nap, at least have restful times when baby eats. The more rest you get, the more patient you will be, the more emotionally stable, and better at solving problems.  Food You may not have realized it would be difficult to eat when you have a newborn. Yet, when we talk to countless new parents, they say things like "it may be 2:00 pm  when I realize I haven't had breakfast yet." Or "every time we sit down to dinner, baby needs to eat, and my food gets cold, so I don't bother to eat it." Finger food. Before your baby is born, stock up with one months' worth of food that: 1) you can eat with one hand while holding a baby, 2) doesn't need to be prepped, 3) is good hot or cold, 4) doesn't spoil when left out for a few hours, and 5) you like to eat. Think about: nuts, dried fruit, Clif bars, pretzels, jerky, gogurt, baby carrots, apples, bananas, crackers, cheez-n-crackers, string cheese, hot pockets or frozen burritos to microwave, garden burgers and breakfast pastries to put in the toaster, yogurt drinks, etc. Restaurant Menus. Make lists of your favorite restaurants & menu items. When family/friends want to help, you can give specific information without much thought. They can either bring you the food or send gift cards for just the right meals. Freezer Meals.  Take some time to make a few meals to put in the freezer ahead of time.  Easy to freeze meals can be anything such as soup, lasagna, chicken pie, or spaghetti sauce. Set up a Meal Schedule.  Ask friends and family to sign up to bring you meals during the first few weeks of being home. (It can be passed around at baby showers!) You have no idea how helpful this will be until you are in the throes of parenting.  [employer].it is a great website to check out. Emotional Support Know who to call when you're stressed out. Parenting a newborn is very challenging work. There are times when it totally overwhelms your normal coping abilities. EVERY NEW PARENT NEEDS TO HAVE A PLAN FOR WHO TO CALL WHEN THEY JUST CAN'T COPE ANY MORE. (And it has to be someone other than the baby's other parent!) Before your baby is born, come up with at least one person you can call for support - write their phone number down and post it on the refrigerator. Anxiety & Sadness. Baby blues are normal after  pregnancy; however, there are more severe types of anxiety & sadness which can occur and should not be ignored.  They are always treatable, but you have to take the first step by reaching out for help. [facility] offers a "Mom Talk" group which meets every Tuesday from 10 am - 11 am.  This group is for new moms who need support and connection after their babies are born.  Call [phone].  Really, Really  Helpful (Plan for them! Make sure these happen often!!) Physical Support with Taking Care of Yourselves Asking friends and family. Before your baby is born, set up a schedule of people who can come and visit and help out (or ask a friend to schedule for you). Any time someone says "let me know what I can do to help," sign them up for a day. When they get there, their job is not to take care of the baby (that's your job and your joy). Their job is to take care of you!  Postpartum doulas. If you don't have anyone you can call on for support, look into postpartum doulas:  professionals at helping parents with caring for baby, caring for themselves, getting breastfeeding started, and helping with household tasks. www.padanc.org is a helpful website for learning about doulas in our area. Peer Support / Parent Groups Why: One of the greatest ideas for new parents is to be around other new parents. Parent groups give you a chance to share and listen to others who are going through the same season of life, get a sense of what is normal infant development by watching several babies learn and grow, share your stories of triumph and struggles with empathetic ears, and forgive your own mistakes when you realize all parents are learning by trial and error. Where to find: There are many places you can meet other new parents throughout our community.  [facility] offers the following classes for new moms and their little ones:  Baby and Me (Birth to Crawling) and Breastfeeding Support Group. Go to  [email or web address] or call [phone] for more information. Time for your Relationship It's easy to get so caught up in meeting baby's immediate needs that it's hard to find time to connect with your partner, and meet the needs of your relationship. It's also easy to forget what "quality time with your partner" actually looks like. If you take your baby on a date, you'd be amazed how much of your couple time is spent feeding the baby, diapering the baby, admiring the baby, and talking about the baby. Dating: Try to take time for just the two of you. Babysitter tip: Sometimes when moms are breastfeeding a newborn, they find it hard to figure out how to schedule outings around baby's unpredictable feeding schedules. Have the babysitter come for a three hour period. When she comes over, if baby has just eaten, you can leave right away, and come back in two hours. If baby hasn't fed recently, you start the date at home. Once baby gets hungry and gets a good feeding in, you can head out for the rest of your date time. Date Nights at Home: If you can't get out, at least set aside one evening a week to prioritize your relationship: whenever baby dozes off or doesn't have any immediate needs, spend a little time focusing on each other. Potential conflicts: The main relationship conflicts that come up for new parents are: issues related to sexuality, financial stresses, a feeling of an unfair division of household tasks, and conflicts in parenting styles. The more you can work on these issues before baby arrives, the better!  Fun and Frills (Don't forget these. and don't feel guilty for indulging in them!) Everyone has something in life that is a fun little treat that they do just for themselves. It may be: reading the morning paper, or going for a daily jog, or having coffee with a friend once a week, or going to a  movie on Friday nights, or fine chocolates, or bubble baths, or curling up with a good  book. Unless you do fun things for yourself every now and then, it's hard to have the energy for fun with your baby. Whatever your "special" treats are, make sure you find a way to continue to indulge in them after your baby is born. These special moments can recharge you, and allow you to return to baby with a new joy   PERINATAL MOOD DISORDERS: MATERNAL MENTAL HEALTH FROM CONCEPTION THROUGH THE POSTPARTUM PERIOD   _________________________________________Emergency and Crisis Resources If you are an imminent risk to self or others, are experiencing intense personal distress, and/or have noticed significant changes in activities of daily living, call:  911 [facility]: [phone]  [address], [place], [place], [address] Mobile Crisis: [phone] National Suicide Hotline: 988 Or visit the following crisis centers: Local Emergency Departments [facility]: [address] [place]  [phone]. Hours: 8:30AM-5PM. Insurance Accepted: Medicaid, Medicare, and Uninsured.  [facility]:  [address] [place]  Mon-Friday 8am-3pm, [phone]                                                                                  ___________ Non-Crisis Resources To identify specific providers that are covered by your insurance, contact your insurance company or local agencies:  [place]: [phone] [facility]: [phone] [place]: [phone] Postpartum Support International- Warm-line: (320) 695-5475                                                      __Outpatient Therapy and Medication Management   Providers:  [facility]: [phone] Hours: 9AM-5PM  Insurance Accepted: Pilar Jarvis, Chase Caller, [place], Medicare  [facility] of Care): [phone] Hours: 8AM-5:30PM  nsurance Accepted: All insurances EXCEPT AARP, [place],  [place], and [employer] of the [place]: [phone] Hours: 8AM-8PM Insurance Accepted: Ulla Gallo, Crista Luria, [place], Medicare, [phone] [facility]: [phone] Hours: 8:30AM-7PM Insurance Accepted: Ulla Gallo, Medicaid, Medicare, Tricare, [employer] [facility]:  [phone]   Hours:9AM-5PM Insurance Accepted:  Monia Pouch, Ezequiel [place], [employer], [place], [employer] Care  [facility]: [phone] Hours: 9AM-5:30PM Insurance Accepted: Pilar Jarvis, Teodoro Spray, and Medicaid, Medicare, [facility]:  [phone] Hours: 9am-5pm Insurance Accepted: BCBS; they do not accept Medicaid/Medicare [facility]: [phone] Hours: 9am-9pm Insurance Accepted: All major insurance including Medicaid and Medicare [facility]: [phone] Hours: 9AM- 5PM Insurance Accepted: All insurances EXCEPT Medicaid and Medicare. [facility]: [phone]   ____________  Parenting Support Groups [facility]: [phone] [facility]:  [phone] Family Support Network: (support for children in the NICU and/or with special needs), [phone]   ___________                                                                 Mental Health Support Groups Mental Health Association: [phone]    _____________                                                                                  Online Resources Postpartum Support International: SeekAlumni.co.za  800-944-4PPD Supporting Moms:  www.momssupportingmoms.net

## 2023-05-02 ENCOUNTER — Inpatient Hospital Stay (HOSPITAL_COMMUNITY)
Admission: AD | Admit: 2023-05-02 | Discharge: 2023-05-02 | Disposition: A | Discharge status: Home / Self Care | Payer: Medicaid Other | Attending: Obstetrics and Gynecology | Admitting: Obstetrics and Gynecology

## 2023-05-02 DIAGNOSIS — O26891 Other specified pregnancy related conditions, first trimester: Secondary | ICD-10-CM | POA: Diagnosis present

## 2023-05-02 DIAGNOSIS — O26892 Other specified pregnancy related conditions, second trimester: Secondary | ICD-10-CM | POA: Diagnosis not present

## 2023-05-02 DIAGNOSIS — W19XXXA Unspecified fall, initial encounter: Secondary | ICD-10-CM

## 2023-05-02 DIAGNOSIS — W1830XA Fall on same level, unspecified, initial encounter: Secondary | ICD-10-CM | POA: Insufficient documentation

## 2023-05-02 DIAGNOSIS — Z3A19 19 weeks gestation of pregnancy: Secondary | ICD-10-CM | POA: Insufficient documentation

## 2023-05-02 MED ORDER — CYCLOBENZAPRINE HCL 10 MG PO TABS: 10.0000 mg | ORAL_TABLET | Freq: Three times a day (TID) | ORAL | 0 refills | Status: DC | PRN

## 2023-05-02 NOTE — MAU Provider Note (Signed)
Event Date/Time   First Provider Initiated Contact with Patient 05/02/23 1253      S Ms. Gina Alvarez is a 32 y.o. [id number] pregnant female at [redacted]w[redacted]d who presents to MAU today with complaint of fall on her her L flank. Pt states that she fell today getting her dog back in the house onto her L flank around 0830 this morning.  Denies any abdominal trauma or pain. 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  Denies VB, LOF.  +FM (quickening given gestational age).    [facility] Prenatal records reviewed.  Pertinent items noted in HPI and remainder of comprehensive ROS otherwise negative.   O BP (!) 105/58 (BP Location: Right Arm)   Pulse 86   Temp (!) 97.5 F (36.4 C) (Oral)   Resp 14   Ht 4\' 11"  (1.499 m)   Wt 67.2 kg   LMP 12/18/2022   SpO2 100%   BMI 29.91 kg/m  Physical Exam Vitals and nursing note reviewed.  Constitutional:      General: She is not in acute distress.    Appearance: She is well-developed. She is not ill-appearing.  HENT:     Head: Normocephalic and atraumatic.  Eyes:     Extraocular Movements: Extraocular movements intact.  Cardiovascular:     Rate and Rhythm: Normal rate.  Pulmonary:     Effort: Pulmonary effort is normal. No respiratory distress.  Abdominal:     General: There is no distension. There are no signs of injury.     Palpations: Abdomen is soft.     Tenderness: There is no abdominal tenderness.     Comments: gravid  Musculoskeletal:       Arms:     Comments: No TTP along ribs or area of L flank described by patient below, no bruising noted either, no deformity appreciated   Skin:    General: Skin is warm and dry.     Comments: No bruising   Neurological:     Mental Status: She is alert and oriented to person, place, and time.     Motor: No weakness.  Psychiatric:        Mood and Affect: Mood normal.        Behavior: Behavior  normal.      MDM: low MAU Course:  Pt reported at previable gestational age with ground level fall onto posterior L flank w/o abdominal trauma, denying VB/LOF, and endorsing +FM. FHT confirmed in triage by nursing. Informed about risk of placental abruption and given strict return precautions.  Pt offered muscle relaxer while in MAU and she elected to just have prescription sent to pharmacy.  Pt understanding of plan and agreeable, no other questions or concerns at this time.   A&P #19 weeks  #Fall   Discharge from MAU in stable condition with strict/usual precautions Follow up at [facility] as scheduled for ongoing prenatal care  Allergies as of 05/02/2023   No Known Allergies      Medication List     STOP taking these medications    tiZANidine 4 MG tablet Commonly known as: ZANAFLEX       TAKE these medications    cyclobenzaprine 10 MG tablet Commonly known as: FLEXERIL Take 1 tablet (10 mg total) by mouth 3 (three) times daily as needed (headache).   Doxylamine-Pyridoxine 10-10 MG Tbec Commonly known as: Diclegis 2 tabs  q hs, if sx persist add 1 tab q am on day 3, if sx persist add 1 tab q afternoon on day 4   levocetirizine 5 MG tablet Commonly known as: XYZAL TAKE ONE TABLET BY MOUTH EVERY DAY   levothyroxine 50 MCG tablet Commonly known as: SYNTHROID Take 1 tablet (50 mcg total) by mouth daily.   ondansetron 4 MG tablet Commonly known as: ZOFRAN Take 1 tablet (4 mg total) by mouth every 8 (eight) hours as needed for nausea or vomiting.   PRENATAL ADULT GUMMY/DHA/FA PO Take by mouth daily.   sertraline 25 MG tablet Commonly known as: Zoloft Take 1 tablet (25 mg total) by mouth daily.        Hessie Dibble, MD 05/02/2023 1:21 PM

## 2023-05-02 NOTE — MAU Note (Signed)
..  Gina Alvarez is a 32 y.o. at [redacted]w[redacted]d here in MAU reporting: this morning around 0800 this morning she was trying to catch her dog because it got off of the leash and then she fell down on her left side and hit the left side of her abdomen. She took some tylenol and that has helped the pain a little bit, but she is still hurting. Initially, she had some lower abdominal cramping, but that has since went away. Denies VB or LOF.   Pain score: 5 Vitals:   05/02/23 1241  BP: (!) 105/58  Pulse: 86  Resp: 14  Temp: (!) 97.5 F (36.4 C)  SpO2: 100%     FHT:152

## 2023-05-05 ENCOUNTER — Ambulatory Visit (INDEPENDENT_AMBULATORY_CARE_PROVIDER_SITE_OTHER): Payer: Medicaid Other | Admitting: Women's Health

## 2023-05-05 ENCOUNTER — Ambulatory Visit: Payer: Medicaid Other

## 2023-05-05 ENCOUNTER — Encounter: Payer: Self-pay | Admitting: Women's Health

## 2023-05-05 VITALS — BP 98/63 | HR 84 | Wt 148.4 lb

## 2023-05-05 DIAGNOSIS — Z3482 Encounter for supervision of other normal pregnancy, second trimester: Secondary | ICD-10-CM

## 2023-05-05 DIAGNOSIS — Z3A19 19 weeks gestation of pregnancy: Secondary | ICD-10-CM

## 2023-05-05 DIAGNOSIS — F418 Other specified anxiety disorders: Secondary | ICD-10-CM

## 2023-05-05 DIAGNOSIS — Z363 Encounter for antenatal screening for malformations: Secondary | ICD-10-CM | POA: Diagnosis not present

## 2023-05-05 DIAGNOSIS — Z3480 Encounter for supervision of other normal pregnancy, unspecified trimester: Secondary | ICD-10-CM

## 2023-05-05 MED ORDER — SERTRALINE HCL 50 MG PO TABS: 50.0000 mg | ORAL_TABLET | Freq: Every day | ORAL | 3 refills | Status: DC

## 2023-05-05 NOTE — Progress Notes (Signed)
LOW-RISK PREGNANCY VISIT Patient name: Gina Alvarez MRN [id number]  Date of birth: 02-01-1991 Chief Complaint:   Routine Prenatal Visit and Pregnancy Ultrasound  History of Present Illness:   Gina Alvarez is a 32 y.o. [id number] female at [redacted]w[redacted]d with an Estimated Date of Delivery: 09/24/23 being seen today for ongoing management of a low-risk pregnancy.   Today she reports  wants to increase zoloft (on 25mg ), doing much better, but still getting irritated . Contractions: Not present.  .  Movement: Present. denies leaking of fluid.     05/05/2023   10:17 AM 03/17/2023   10:08 AM 12/10/2022    9:03 AM 11/12/2022    3:30 PM 01/03/2022    1:59 PM  Depression screen PHQ 2/9  Decreased Interest 2 2 0 0 0  Down, Depressed, Hopeless 1 2 0 0 0  PHQ - 2 Score 3 4 0 0 0  Altered sleeping 0 0 0 0 0  Tired, decreased energy 2 2 0 0 0  Change in appetite 0 0 2 0 0  Feeling bad or failure about yourself  1 1 0 0 0  Trouble concentrating 0 0 0 0 0  Moving slowly or fidgety/restless 0 0 0 0 0  Suicidal thoughts 0 0 0 0 0  PHQ-9 Score 6 7 2  0 0  Difficult doing work/chores    Not difficult at all         05/05/2023   10:19 AM 03/17/2023   10:08 AM 12/10/2022    9:03 AM 11/12/2022    3:33 PM  GAD 7 : Generalized Anxiety Score  Nervous, Anxious, on Edge 0 2 0 3  Control/stop worrying 2 1 0 0  Worry too much - different things 2 1 0 3  Trouble relaxing 0 1 0 0  Restless 0 0 0 3  Easily annoyed or irritable 3 3 0 0  Afraid - awful might happen 0 0 0 3  Total GAD 7 Score 7 8 0 12  Anxiety Difficulty    Very difficult      Review of Systems:   Pertinent items are noted in HPI Denies abnormal vaginal discharge w/ itching/odor/irritation, headaches, visual changes, shortness of breath, chest pain, abdominal pain, severe nausea/vomiting, or problems with urination or bowel movements unless otherwise stated above. Pertinent History Reviewed:  Reviewed past medical,surgical, social,  obstetrical and family history.  Reviewed problem list, medications and allergies. Physical Assessment:   Vitals:   05/05/23 1003  BP: 98/63  Pulse: 84  Weight: 148 lb 6.4 oz (67.3 kg)  Body mass index is 29.97 kg/m.        Physical Examination:   General appearance: Well appearing, and in no distress  Mental status: Alert, oriented to person, place, and time  Skin: Warm & dry  Cardiovascular: Normal heart rate noted  Respiratory: Normal respiratory effort, no distress  Abdomen: Soft, gravid, nontender  Pelvic: Cervical exam deferred         Extremities: Edema: None  Fetal Status:     Movement: Present  [place] 19+5 wks,cephalic,cx 3.8 cm,anterior placenta gr 0,normal ovaries,SVP of fluid 4.7 cm,FHR 159 bpm,EFW 310 g 46%,anatomy complete,no obvious abnormalities   Chaperone: N/A   No results found for this or any previous visit (from the past 24 hour(s)).  Assessment & Plan:  1) Low-risk pregnancy [id number] at [redacted]w[redacted]d with an Estimated Date of Delivery: 09/24/23   2) Dep/anx, increase zoloft to 50mg , has had appt w/ [facility]  3) Hypothyroid, no meds, last TSH wnl   4) BRCA+ s/p bilateral ppx mastectomy/implants- needs hysterectomy pp-discuss w/ MD next visit   Meds:  Meds ordered this encounter  Medications   sertraline (ZOLOFT) 50 MG tablet    Sig: Take 1 tablet (50 mg total) by mouth daily.    Dispense:  90 tablet    Refill:  3   Labs/procedures today: U/S  Plan:  Continue routine obstetrical care  Next visit: prefers in person    Reviewed: Preterm labor symptoms and general obstetric precautions including but not limited to vaginal bleeding, contractions, leaking of fluid and fetal movement were reviewed in detail with the patient.  All questions were answered. Does have home bp cuff. Office bp cuff given: not applicable. Check bp weekly, let us know if consistently >140 and/or >90.  Follow-up: Return in about 4 weeks (around 06/02/2023) for LROB, MD, in person.  Future  Appointments  Date Time Provider Department Center  06/04/2023  2:50 PM Jacklyn Shell, CNM [facility]  09/18/2023  8:15 AM Dillingham, Alena Bills, DO [facility] None    No orders of the defined types were placed in this encounter.  Cheral Marker CNM, [facility] 05/05/2023 10:46 AM

## 2023-05-05 NOTE — Progress Notes (Signed)
[place] 19+5 wks,cephalic,cx 3.8 cm,anterior placenta gr 0,normal ovaries,SVP of fluid 4.7 cm,FHR 159 bpm,EFW 310 g 46%,anatomy complete,no obvious abnormalities

## 2023-05-05 NOTE — Patient Instructions (Signed)
Gina Alvarez, thank you for choosing our office today! We appreciate the opportunity to meet your healthcare needs. You may receive a short survey by mail, e-mail, or through [employer]. If you are happy with your care we would appreciate if you could take just a few minutes to complete the survey questions. We read all of your comments and take your feedback very seriously. Thank you again for choosing our office.  Center for [employer] Team at [facility] ([address] [place], [place] [address]) Entrance C, located off of [address] Free 24/7 valet parking  Go to [email or web address] to register for FREE online childbirth classes  Call the office [phone]) or go to [facility] if: You begin to severe cramping Your water breaks.  Sometimes it is a big gush of fluid, sometimes it is just a trickle that keeps getting your panties wet or running down your legs You have vaginal bleeding.  It is normal to have a small amount of spotting if your cervix was checked.   [facility] Pediatricians/Family Doctors [facility]): [address] Colette Ribas, [phone]           [facility]: [address], [phone]                [facility]): [address], [phone] (call to ask if accepting patients) [facility] Department: [address], [place], [phone]    [facility] Pediatricians/Family Doctors [facility]): [phone], [phone] [facility]: [address] [place], [phone] [facility]: [address], [phone]  [facility] Doctors  [facility]): [phone] [facility]: [address] [phone]   [facility] Doctors [facility]: [address] [phone]  [facility] Doctors  [facility]: [phone], [phone]  Home Blood Pressure Monitoring for Patients   Your provider has recommended that you check your blood pressure (BP) at least once a week at home. If you do not have a blood pressure cuff at home, one will be provided for you. Contact your provider if you have not received your monitor within 1 week.   Helpful Tips for Accurate Home Blood Pressure Checks  Don't smoke, exercise, or drink caffeine 30 minutes before checking your BP Use the restroom before checking your BP (a full bladder can raise your pressure) Relax in a comfortable upright chair Feet on the ground Left arm resting comfortably on a flat surface at the level of your heart Legs uncrossed Back supported Sit quietly and don't talk Place the cuff on your bare arm Adjust snuggly, so that only two fingertips can fit between your skin and the top of the cuff Check 2 readings separated by at least one minute Keep a log of your BP readings For a visual, please reference this diagram: http://ccnc.care/bpdiagram  Provider Name: [facility]     Phone: [phone]  Zone 1: ALL CLEAR  Continue to monitor your symptoms:  BP reading is less than 140 (top number) or less than 90 (bottom number)  No right upper stomach pain No headaches or seeing spots No feeling nauseated or throwing up No swelling in face and hands  Zone 2: CAUTION Call your doctor's office for any of the following:  BP reading is greater than 140 (top number) or greater than  90 (bottom number)  Stomach pain under your ribs in the middle or right side Headaches or seeing spots Feeling nauseated or throwing up Swelling in face and hands  Zone 3: EMERGENCY  Seek immediate medical care if you have any of the following:  BP reading is greater than160 (top number) or greater than 110 (bottom number) Severe headaches not improving with Tylenol Serious difficulty catching your breath Any worsening symptoms from  Zone 2     Second Trimester of Pregnancy The second trimester is from week 14 through week 27 (months 4 through 6). The second trimester is often a time when you feel your best. Your body has adjusted to being pregnant, and you begin to feel better physically. Usually, morning sickness has lessened or quit completely, you may have more energy, and you may have an increase in appetite. The second trimester is also a time when the fetus is growing rapidly. At the end of the sixth month, the fetus is about 9 inches long and weighs about 1 pounds. You will likely begin to feel the baby move (quickening) between 16 and 20 weeks of pregnancy. Body changes during your second trimester Your body continues to go through many changes during your second trimester. The changes vary from woman to woman. Your weight will continue to increase. You will notice your lower abdomen bulging out. You may begin to get stretch marks on your hips, abdomen, and breasts. You may develop headaches that can be relieved by medicines. The medicines should be approved by your health care provider. You may urinate more often because the fetus is pressing on your bladder. You may develop or continue to have heartburn as a result of your pregnancy. You may develop constipation because certain hormones are causing the muscles that push waste through your intestines to slow down. You may develop hemorrhoids or swollen, bulging veins (varicose veins). You may have back pain. This is caused by: Weight gain. Pregnancy hormones that are relaxing the joints in your pelvis. A shift in weight and the muscles that support your balance. Your breasts will continue to grow and they will continue to become tender. Your gums may bleed and may be sensitive to brushing and flossing. Dark spots or blotches (chloasma, mask of pregnancy) may develop on your face. This will likely fade after the baby is born. A dark line from your belly button to  the pubic area (linea nigra) may appear. This will likely fade after the baby is born. You may have changes in your hair. These can include thickening of your hair, rapid growth, and changes in texture. Some women also have hair loss during or after pregnancy, or hair that feels dry or thin. Your hair will most likely return to normal after your baby is born.  What to expect at prenatal visits During a routine prenatal visit: You will be weighed to make sure you and the fetus are growing normally. Your blood pressure will be taken. Your abdomen will be measured to track your baby's growth. The fetal heartbeat will be listened to. Any test results from the previous visit will be discussed.  Your health care provider may ask you: How you are feeling. If you are feeling the baby move. If you have had any abnormal symptoms, such as leaking fluid, bleeding, severe headaches, or abdominal cramping. If you are using any tobacco products, including cigarettes, chewing tobacco, and electronic cigarettes. If you have any questions.  Other tests that may be performed during  your second trimester include: Blood tests that check for: Low iron levels (anemia). High blood sugar that affects pregnant women (gestational diabetes) between 24 and 28 weeks. Rh antibodies. This is to check for a protein on red blood cells (Rh factor). Urine tests to check for infections, diabetes, or protein in the urine. An ultrasound to confirm the proper growth and development of the baby. An amniocentesis to check for possible genetic problems. Fetal screens for spina bifida and Down syndrome. HIV (human immunodeficiency virus) testing. Routine prenatal testing includes screening for HIV, unless you choose not to have this test.  Follow these instructions at home: Medicines Follow your health care provider's instructions regarding medicine use. Specific medicines may be either safe or unsafe to take during  pregnancy. Take a prenatal vitamin that contains at least 600 micrograms (mcg) of folic acid. If you develop constipation, try taking a stool softener if your health care provider approves. Eating and drinking Eat a balanced diet that includes fresh fruits and vegetables, whole grains, good sources of protein such as meat, eggs, or tofu, and low-fat dairy. Your health care provider will help you determine the amount of weight gain that is right for you. Avoid raw meat and uncooked cheese. These carry germs that can cause birth defects in the baby. If you have low calcium intake from food, talk to your health care provider about whether you should take a daily calcium supplement. Limit foods that are high in fat and processed sugars, such as fried and sweet foods. To prevent constipation: Drink enough fluid to keep your urine clear or pale yellow. Eat foods that are high in fiber, such as fresh fruits and vegetables, whole grains, and beans. Activity Exercise only as directed by your health care provider. Most women can continue their usual exercise routine during pregnancy. Try to exercise for 30 minutes at least 5 days a week. Stop exercising if you experience uterine contractions. Avoid heavy lifting, wear low heel shoes, and practice good posture. A sexual relationship may be continued unless your health care provider directs you otherwise. Relieving pain and discomfort Wear a good support bra to prevent discomfort from breast tenderness. Take warm sitz baths to soothe any pain or discomfort caused by hemorrhoids. Use hemorrhoid cream if your health care provider approves. Rest with your legs elevated if you have leg cramps or low back pain. If you develop varicose veins, wear support hose. Elevate your feet for 15 minutes, 3-4 times a day. Limit salt in your diet. Prenatal Care Write down your questions. Take them to your prenatal visits. Keep all your prenatal visits as told by your health  care provider. This is important. Safety Wear your seat belt at all times when driving. Make a list of emergency phone numbers, including numbers for family, friends, the hospital, and police and fire departments. General instructions Ask your health care provider for a referral to a local prenatal education class. Begin classes no later than the beginning of month 6 of your pregnancy. Ask for help if you have counseling or nutritional needs during pregnancy. Your health care provider can offer advice or refer you to specialists for help with various needs. Do not use hot tubs, steam rooms, or saunas. Do not douche or use tampons or scented sanitary pads. Do not cross your legs for long periods of time. Avoid cat litter boxes and soil used by cats. These carry germs that can cause birth defects in the baby and possibly loss of the  fetus by miscarriage or stillbirth. Avoid all smoking, herbs, alcohol, and unprescribed drugs. Chemicals in these products can affect the formation and growth of the baby. Do not use any products that contain nicotine or tobacco, such as cigarettes and e-cigarettes. If you need help quitting, ask your health care provider. Visit your dentist if you have not gone yet during your pregnancy. Use a soft toothbrush to brush your teeth and be gentle when you floss. Contact a health care provider if: You have dizziness. You have mild pelvic cramps, pelvic pressure, or nagging pain in the abdominal area. You have persistent nausea, vomiting, or diarrhea. You have a bad smelling vaginal discharge. You have pain when you urinate. Get help right away if: You have a fever. You are leaking fluid from your vagina. You have spotting or bleeding from your vagina. You have severe abdominal cramping or pain. You have rapid weight gain or weight loss. You have shortness of breath with chest pain. You notice sudden or extreme swelling of your face, hands, ankles, feet, or legs. You  have not felt your baby move in over an hour. You have severe headaches that do not go away when you take medicine. You have vision changes. Summary The second trimester is from week 14 through week 27 (months 4 through 6). It is also a time when the fetus is growing rapidly. Your body goes through many changes during pregnancy. The changes vary from woman to woman. Avoid all smoking, herbs, alcohol, and unprescribed drugs. These chemicals affect the formation and growth your baby. Do not use any tobacco products, such as cigarettes, chewing tobacco, and e-cigarettes. If you need help quitting, ask your health care provider. Contact your health care provider if you have any questions. Keep all prenatal visits as told by your health care provider. This is important. This information is not intended to replace advice given to you by your health care provider. Make sure you discuss any questions you have with your health care provider. Document Released: 05/06/2001 Document Revised: 10/18/2015 Document Reviewed: 07/13/2012 Elsevier Interactive Patient Education  2017 [employer].

## 2023-05-06 DIAGNOSIS — M62830 Muscle spasm of back: Secondary | ICD-10-CM | POA: Diagnosis not present

## 2023-05-06 DIAGNOSIS — M9903 Segmental and somatic dysfunction of lumbar region: Secondary | ICD-10-CM | POA: Diagnosis not present

## 2023-05-06 DIAGNOSIS — M9902 Segmental and somatic dysfunction of thoracic region: Secondary | ICD-10-CM | POA: Diagnosis not present

## 2023-05-06 DIAGNOSIS — M9901 Segmental and somatic dysfunction of cervical region: Secondary | ICD-10-CM | POA: Diagnosis not present

## 2023-05-07 DIAGNOSIS — M9903 Segmental and somatic dysfunction of lumbar region: Secondary | ICD-10-CM | POA: Diagnosis not present

## 2023-05-07 DIAGNOSIS — M9902 Segmental and somatic dysfunction of thoracic region: Secondary | ICD-10-CM | POA: Diagnosis not present

## 2023-05-07 DIAGNOSIS — M62830 Muscle spasm of back: Secondary | ICD-10-CM | POA: Diagnosis not present

## 2023-05-07 DIAGNOSIS — M9901 Segmental and somatic dysfunction of cervical region: Secondary | ICD-10-CM | POA: Diagnosis not present

## 2023-05-11 DIAGNOSIS — M62830 Muscle spasm of back: Secondary | ICD-10-CM | POA: Diagnosis not present

## 2023-05-11 DIAGNOSIS — M9901 Segmental and somatic dysfunction of cervical region: Secondary | ICD-10-CM | POA: Diagnosis not present

## 2023-05-11 DIAGNOSIS — M9902 Segmental and somatic dysfunction of thoracic region: Secondary | ICD-10-CM | POA: Diagnosis not present

## 2023-05-11 DIAGNOSIS — M9903 Segmental and somatic dysfunction of lumbar region: Secondary | ICD-10-CM | POA: Diagnosis not present

## 2023-05-27 NOTE — L&D Delivery Note (Signed)
 Obstetrical Delivery Note   Date of Delivery:   09/16/2023 Primary OB:   [facility] Gestational Age/EDD: [redacted]w[redacted]d Reason for Admission: Active labor Antepartum complications: Hypothyroidism. Anxiety/depression. BRCA+  Delivered By:   Raynell Caller, Marieta Shorten MD  Delivery Type:   spontaneous vaginal delivery  Delivery Details:   I was called to see patient for delivery and she was +3 and she easily delivered a baby without issue with the next few contractions. Anesthesia:    epidural Intrapartum complications: None GBS:    Negative Laceration:    none Episiotomy:    none Rectal exam:   deferred Placenta:    Delivered and expressed via active management. Intact: yes. To pathology: no.  Delayed Cord Clamping: yes Estimated Blood Loss:   Baby:    Liveborn female, APGARs 8/9, weight 3510gm  Tyler Gallant. MD Attending Center for [employer] [facility])

## 2023-06-04 ENCOUNTER — Encounter: Payer: Self-pay | Admitting: Obstetrics & Gynecology

## 2023-06-04 ENCOUNTER — Ambulatory Visit (INDEPENDENT_AMBULATORY_CARE_PROVIDER_SITE_OTHER): Payer: Medicaid Other | Admitting: Obstetrics & Gynecology

## 2023-06-04 ENCOUNTER — Encounter: Payer: Medicaid Other | Admitting: Advanced Practice Midwife

## 2023-06-04 VITALS — BP 104/61 | HR 79 | Wt 150.0 lb

## 2023-06-04 DIAGNOSIS — Z3A24 24 weeks gestation of pregnancy: Secondary | ICD-10-CM

## 2023-06-04 DIAGNOSIS — Z3480 Encounter for supervision of other normal pregnancy, unspecified trimester: Secondary | ICD-10-CM

## 2023-06-04 DIAGNOSIS — Z3482 Encounter for supervision of other normal pregnancy, second trimester: Secondary | ICD-10-CM

## 2023-06-04 MED ORDER — ONDANSETRON 8 MG PO TBDP: 8.0000 mg | ORAL_TABLET | Freq: Three times a day (TID) | ORAL | 1 refills | Status: DC | PRN

## 2023-06-04 MED ORDER — PANTOPRAZOLE SODIUM 40 MG PO TBEC: 40.0000 mg | DELAYED_RELEASE_TABLET | Freq: Every day | ORAL | 4 refills | Status: DC

## 2023-06-04 NOTE — Progress Notes (Signed)
 LOW-RISK PREGNANCY VISIT Patient name: Gina Alvarez MRN [id number]  Date of birth: [birth year] Chief Complaint:   Routine Prenatal Visit  History of Present Illness:   Gina Alvarez is a 33 y.o. [id number] female at [redacted]w[redacted]d with an Estimated Date of Delivery: 09/24/23 being seen today for ongoing management of a low-risk pregnancy.     05/05/2023   10:17 AM 03/17/2023   10:08 AM 12/10/2022    9:03 AM 11/12/2022    3:30 PM 01/03/2022    1:59 PM  Depression screen PHQ 2/9  Decreased Interest 2 2 0 0 0  Down, Depressed, Hopeless 1 2 0 0 0  PHQ - 2 Score 3 4 0 0 0  Altered sleeping 0 0 0 0 0  Tired, decreased energy 2 2 0 0 0  Change in appetite 0 0 2 0 0  Feeling bad or failure about yourself  1 1 0 0 0  Trouble concentrating 0 0 0 0 0  Moving slowly or fidgety/restless 0 0 0 0 0  Suicidal thoughts 0 0 0 0 0  PHQ-9 Score 6 7 2  0 0  Difficult doing work/chores    Not difficult at all     Today she reports no complaints. Contractions: Not present. Vag. Bleeding: None.  Movement: Present. denies leaking of fluid. Review of Systems:   Pertinent items are noted in HPI Denies abnormal vaginal discharge w/ itching/odor/irritation, headaches, visual changes, shortness of breath, chest pain, abdominal pain, severe nausea/vomiting, or problems with urination or bowel movements unless otherwise stated above. Pertinent History Reviewed:  Reviewed past medical,surgical, social, obstetrical and family history.  Reviewed problem list, medications and allergies. Physical Assessment:   Vitals:   06/04/23 1510  BP: 104/61  Pulse: 79  Weight: 150 lb (68 kg)  Body mass index is 30.3 kg/m.        Physical Examination:   General appearance: Well appearing, and in no distress  Mental status: Alert, oriented to person, place, and time  Skin: Warm & dry  Cardiovascular: Normal heart rate noted  Respiratory: Normal respiratory effort, no distress  Abdomen: Soft, gravid, nontender  Pelvic:  Cervical exam deferred         Extremities:    Fetal Status:     Movement: Present    Chaperone: n/a    No results found for this or any previous visit (from the past 24 hours).  Assessment & Plan:  1) Low-risk pregnancy [id number] at [redacted]w[redacted]d with an Estimated Date of Delivery: 09/24/23   2) BRCA + breast cancer, pp RA TLH + BSO   Meds:  Meds ordered this encounter  Medications   pantoprazole  (PROTONIX ) 40 MG tablet    Sig: Take 1 tablet (40 mg total) by mouth daily.    Dispense:  30 tablet    Refill:  4   ondansetron  (ZOFRAN -ODT) 8 MG disintegrating tablet    Sig: Take 1 tablet (8 mg total) by mouth every 8 (eight) hours as needed for nausea or vomiting.    Dispense:  30 tablet    Refill:  1   Labs/procedures today:   Plan:  Continue routine obstetrical care  Next visit: prefers in person    Reviewed: Preterm labor symptoms and general obstetric precautions including but not limited to vaginal bleeding, contractions, leaking of fluid and fetal movement were reviewed in detail with the patient.  All questions were answered. Has home bp cuff. Rx faxed to . Check bp weekly, let us  know  if >140/90.   Follow-up: Return in about 4 weeks (around 07/02/2023) for LROB.  No orders of the defined types were placed in this encounter.   Vonn VEAR Inch, MD 06/04/2023 3:31 PM

## 2023-07-02 ENCOUNTER — Encounter: Payer: Self-pay | Admitting: Women's Health

## 2023-07-02 ENCOUNTER — Ambulatory Visit: Payer: Medicaid Other | Admitting: Women's Health

## 2023-07-02 ENCOUNTER — Other Ambulatory Visit: Payer: Medicaid Other

## 2023-07-02 VITALS — BP 105/68 | HR 85 | Wt 158.0 lb

## 2023-07-02 DIAGNOSIS — E039 Hypothyroidism, unspecified: Secondary | ICD-10-CM

## 2023-07-02 DIAGNOSIS — Z3A27 27 weeks gestation of pregnancy: Secondary | ICD-10-CM | POA: Diagnosis not present

## 2023-07-02 DIAGNOSIS — Z3480 Encounter for supervision of other normal pregnancy, unspecified trimester: Secondary | ICD-10-CM

## 2023-07-02 DIAGNOSIS — Z3A28 28 weeks gestation of pregnancy: Secondary | ICD-10-CM

## 2023-07-02 DIAGNOSIS — Z23 Encounter for immunization: Secondary | ICD-10-CM

## 2023-07-02 DIAGNOSIS — Z131 Encounter for screening for diabetes mellitus: Secondary | ICD-10-CM | POA: Diagnosis not present

## 2023-07-02 DIAGNOSIS — Z3483 Encounter for supervision of other normal pregnancy, third trimester: Secondary | ICD-10-CM | POA: Diagnosis not present

## 2023-07-02 NOTE — Progress Notes (Signed)
 LOW-RISK PREGNANCY VISIT Patient name: Gina Alvarez MRN [id number]  Date of birth: Oct 31, 1990 Chief Complaint:   Routine Prenatal Visit (Tdap today)  History of Present Illness:   Gina Alvarez is a 33 y.o. [id number] female at [redacted]w[redacted]d with an Estimated Date of Delivery: 09/24/23 being seen today for ongoing management of a low-risk pregnancy.   Today she reports no complaints. Contractions: Not present.  .  Movement: Present. denies leaking of fluid.     07/02/2023    9:57 AM 05/05/2023   10:17 AM 03/17/2023   10:08 AM 12/10/2022    9:03 AM 11/12/2022    3:30 PM  Depression screen PHQ 2/9  Decreased Interest 1 2 2  0 0  Down, Depressed, Hopeless  1 2 0 0  PHQ - 2 Score 1 3 4  0 0  Altered sleeping  0 0 0 0  Tired, decreased energy 1 2 2  0 0  Change in appetite 0 0 0 2 0  Feeling bad or failure about yourself  1 1 1  0 0  Trouble concentrating  0 0 0 0  Moving slowly or fidgety/restless 0 0 0 0 0  Suicidal thoughts 0 0 0 0 0  PHQ-9 Score  6 7 2  0  Difficult doing work/chores     Not difficult at all        05/05/2023   10:19 AM 03/17/2023   10:08 AM 12/10/2022    9:03 AM 11/12/2022    3:33 PM  GAD 7 : Generalized Anxiety Score  Nervous, Anxious, on Edge 0 2 0 3  Control/stop worrying 2 1 0 0  Worry too much - different things 2 1 0 3  Trouble relaxing 0 1 0 0  Restless 0 0 0 3  Easily annoyed or irritable 3 3 0 0  Afraid - awful might happen 0 0 0 3  Total GAD 7 Score 7 8 0 12  Anxiety Difficulty    Very difficult      Review of Systems:   Pertinent items are noted in HPI Denies abnormal vaginal discharge w/ itching/odor/irritation, headaches, visual changes, shortness of breath, chest pain, abdominal pain, severe nausea/vomiting, or problems with urination or bowel movements unless otherwise stated above. Pertinent History Reviewed:  Reviewed past medical,surgical, social, obstetrical and family history.  Reviewed problem list, medications and allergies. Physical  Assessment:   Vitals:   07/02/23 0943  BP: 105/68  Pulse: 85  Weight: 158 lb (71.7 kg)  Body mass index is 31.91 kg/m.        Physical Examination:   General appearance: Well appearing, and in no distress  Mental status: Alert, oriented to person, place, and time  Skin: Warm & dry  Cardiovascular: Normal heart rate noted  Respiratory: Normal respiratory effort, no distress  Abdomen: Soft, gravid, nontender  Pelvic: Cervical exam deferred         Extremities: Edema: None  Fetal Status: Fetal Heart Rate (bpm): 152 Fundal Height: 28 cm Movement: Present    Chaperone: N/A   No results found for this or any previous visit (from the past 24 hours).  Assessment & Plan:  1) Low-risk pregnancy [id number] at [redacted]w[redacted]d with an Estimated Date of Delivery: 09/24/23   2) Hypothyroidism, no meds, check TSH today  4) BRCA+ s/p bilateral ppx mastectomy/implants- pp RA TLH+BSO   Meds: No orders of the defined types were placed in this encounter.  Labs/procedures today: Tdap and PN2  Plan:  Continue routine obstetrical  care  Next visit: prefers in person    Reviewed: Preterm labor symptoms and general obstetric precautions including but not limited to vaginal bleeding, contractions, leaking of fluid and fetal movement were reviewed in detail with the patient.  All questions were answered. Does have home bp cuff. Office bp cuff given: not applicable. Check bp weekly, let us  know if consistently >140 and/or >90.  Follow-up: Return in about 2 weeks (around 07/16/2023) for LROB, CNM, in person.  Future Appointments  Date Time Provider Department Center  09/18/2023  8:15 AM Dillingham, Estefana RAMAN, DO [facility] None    Orders Placed This Encounter  Procedures   Tdap vaccine greater than or equal to 7yo IM   TSH   Gina Alvarez CNM, [facility] 07/02/2023 10:19 AM

## 2023-07-02 NOTE — Patient Instructions (Signed)
[facility], thank you for choosing our office today! We appreciate the opportunity to meet your healthcare needs. You may receive a short survey by mail, e-mail, or through [employer]. If you are happy with your care we would appreciate if you could take just a few minutes to complete the survey questions. We read all of your comments and take your feedback very seriously. Thank you again for choosing our office.  Center for [employer] Team at [facility]  [facility] at [facility] ([address] [place], [place] [address]) Entrance C, located off of [address] Free 24/7 valet parking   CLASSES: Go to [email or web address] to register for classes (childbirth, breastfeeding, waterbirth, infant CPR, daddy bootcamp, etc.)  Call the office [phone]) or go to [facility] if: You begin to have strong, frequent contractions Your water breaks.  Sometimes it is a big gush of fluid, sometimes it is just a trickle that keeps getting your panties wet or running down your legs You have vaginal bleeding.  It is normal to have a small amount of spotting if your cervix was checked.  You don't feel your baby moving like normal.  If you don't, get you something to eat and drink and lay down and focus on feeling your baby move.   If your baby is still not moving like normal, you should call the office or go to [facility].  Call the office [phone]) or go to [facility] hospital for these signs of pre-eclampsia: Severe headache that does not go away with Tylenol Visual changes- seeing spots, double, blurred vision Pain under your right breast or upper abdomen that does not go away with Tums or heartburn medicine Nausea and/or vomiting Severe swelling in your hands, feet, and face   Tdap Vaccine It is recommended that you get the Tdap vaccine during the third trimester of EACH pregnancy to help protect your baby from getting pertussis (whooping cough) 27-36 weeks is the BEST time to do  this so that you can pass the protection on to your baby. During pregnancy is better than after pregnancy, but if you are unable to get it during pregnancy it will be offered at the hospital.  You can get this vaccine with [place], at the health department, your family doctor, or some local pharmacies Everyone who will be around your baby should also be up-to-date on their vaccines before the baby comes. Adults (who are not pregnant) only need 1 dose of Tdap during adulthood.   [facility] Pediatricians/Family Doctors [facility]): [address] Colette Ribas, [phone]           [facility]: [address], [phone]                [facility]): [address], [phone] (call to ask if accepting patients) [facility] Department: [address], [place], [phone]    [facility] Pediatricians/Family Doctors [facility] [phone], [phone] [facility]: [address] [place], [phone] [facility]: [address], [phone]  [facility] Doctors  [facility]): [phone] [facility]: [address], [phone]   [facility]: [address] [phone]  [facility] Family Doctors  [facility]: [phone], [phone]  Home Blood Pressure Monitoring for Patients   Your provider has recommended that you check your  blood pressure (BP) at least once a week at home. If you do not have a blood pressure cuff at home, one will be provided for you. Contact your provider if you have not received your monitor within 1 week.   Helpful Tips for Accurate Home Blood Pressure Checks  Don't smoke, exercise, or drink caffeine 30 minutes before checking your BP Use the restroom before checking your BP (a full bladder can raise your  pressure) Relax in a comfortable upright chair Feet on the ground Left arm resting comfortably on a flat surface at the level of your heart Legs uncrossed Back supported Sit quietly and don't talk Place the cuff on your bare arm Adjust snuggly, so that only two fingertips can fit between your skin and the top of the cuff Check 2 readings separated by at least one minute Keep a log of your BP readings For a visual, please reference this diagram: http://ccnc.care/bpdiagram  Provider Name: [facility]     Phone: [phone]  Zone 1: ALL CLEAR  Continue to monitor your symptoms:  BP reading is less than 140 (top number) or less than 90 (bottom number)  No right upper stomach pain No headaches or seeing spots No feeling nauseated or throwing up No swelling in face and hands  Zone 2: CAUTION Call your doctor's office for any of the following:  BP reading is greater than 140 (top number) or greater than 90 (bottom number)  Stomach pain under your ribs in the middle or right side Headaches or seeing spots Feeling nauseated or throwing up Swelling in face and hands  Zone 3: EMERGENCY  Seek immediate medical care if you have any of the following:  BP reading is greater than160 (top number) or greater than 110 (bottom number) Severe headaches not improving with Tylenol Serious difficulty catching your breath Any worsening symptoms from Zone 2   Third Trimester of Pregnancy The third trimester is from week 29 through week 42, months 7 through 9. The third trimester is a time when the fetus is growing rapidly. At the end of the ninth month, the fetus is about 20 inches in length and weighs 6-10 pounds.  BODY CHANGES Your body goes through many changes during pregnancy. The changes vary from woman to woman.  Your weight will continue to increase. You can expect to gain 25-35 pounds (11-16 kg) by the end of the pregnancy. You may begin to get stretch [place] on your hips, abdomen,  and breasts. You may urinate more often because the fetus is moving lower into your pelvis and pressing on your bladder. You may develop or continue to have heartburn as a result of your pregnancy. You may develop constipation because certain hormones are causing the muscles that push waste through your intestines to slow down. You may develop hemorrhoids or swollen, bulging veins (varicose veins). You may have pelvic pain because of the weight gain and pregnancy hormones relaxing your joints between the bones in your pelvis. Backaches may result from overexertion of the muscles supporting your posture. You may have changes in your hair. These can include thickening of your hair, rapid growth, and changes in texture. Some women also have hair loss during or after pregnancy, or hair that feels dry or thin. Your hair will most likely return to normal after your baby is born. Your breasts will continue to grow and be tender. A yellow discharge may leak from your breasts called colostrum. Your belly button may stick out. You may   feel short of breath because of your expanding uterus. You may notice the fetus "dropping," or moving lower in your abdomen. You may have a bloody mucus discharge. This usually occurs a few days to a week before labor begins. Your cervix becomes thin and soft (effaced) near your due date. WHAT TO EXPECT AT YOUR PRENATAL EXAMS  You will have prenatal exams every 2 weeks until week 36. Then, you will have weekly prenatal exams. During a routine prenatal visit: You will be weighed to make sure you and the fetus are growing normally. Your blood pressure is taken. Your abdomen will be measured to track your baby's growth. The fetal heartbeat will be listened to. Any test results from the previous visit will be discussed. You may have a cervical check near your due date to see if you have effaced. At around 36 weeks, your caregiver will check your cervix. At the same time, your  caregiver will also perform a test on the secretions of the vaginal tissue. This test is to determine if a type of bacteria, Group B streptococcus, is present. Your caregiver will explain this further. Your caregiver may ask you: What your birth plan is. How you are feeling. If you are feeling the baby move. If you have had any abnormal symptoms, such as leaking fluid, bleeding, severe headaches, or abdominal cramping. If you have any questions. Other tests or screenings that may be performed during your third trimester include: Blood tests that check for low iron levels (anemia). Fetal testing to check the health, activity level, and growth of the fetus. Testing is done if you have certain medical conditions or if there are problems during the pregnancy. FALSE LABOR You may feel small, irregular contractions that eventually go away. These are called Braxton Hicks contractions, or false labor. Contractions may last for hours, days, or even weeks before true labor sets in. If contractions come at regular intervals, intensify, or become painful, it is best to be seen by your caregiver.  SIGNS OF LABOR  Menstrual-like cramps. Contractions that are 5 minutes apart or less. Contractions that start on the top of the uterus and spread down to the lower abdomen and back. A sense of increased pelvic pressure or back pain. A watery or bloody mucus discharge that comes from the vagina. If you have any of these signs before the 37th week of pregnancy, call your caregiver right away. You need to go to the hospital to get checked immediately. HOME CARE INSTRUCTIONS  Avoid all smoking, herbs, alcohol, and unprescribed drugs. These chemicals affect the formation and growth of the baby. Follow your caregiver's instructions regarding medicine use. There are medicines that are either safe or unsafe to take during pregnancy. Exercise only as directed by your caregiver. Experiencing uterine cramps is a good sign to  stop exercising. Continue to eat regular, healthy meals. Wear a good support bra for breast tenderness. Do not use hot tubs, steam rooms, or saunas. Wear your seat belt at all times when driving. Avoid raw meat, uncooked cheese, cat litter boxes, and soil used by cats. These carry germs that can cause birth defects in the baby. Take your prenatal vitamins. Try taking a stool softener (if your caregiver approves) if you develop constipation. Eat more high-fiber foods, such as fresh vegetables or fruit and whole grains. Drink plenty of fluids to keep your urine clear or pale yellow. Take warm sitz baths to soothe any pain or discomfort caused by hemorrhoids. Use hemorrhoid cream if   your caregiver approves. If you develop varicose veins, wear support hose. Elevate your feet for 15 minutes, 3-4 times a day. Limit salt in your diet. Avoid heavy lifting, wear low heal shoes, and practice good posture. Rest a lot with your legs elevated if you have leg cramps or low back pain. Visit your dentist if you have not gone during your pregnancy. Use a soft toothbrush to brush your teeth and be gentle when you floss. A sexual relationship may be continued unless your caregiver directs you otherwise. Do not travel far distances unless it is absolutely necessary and only with the approval of your caregiver. Take prenatal classes to understand, practice, and ask questions about the labor and delivery. Make a trial run to the hospital. Pack your hospital bag. Prepare the baby's nursery. Continue to go to all your prenatal visits as directed by your caregiver. SEEK MEDICAL CARE IF: You are unsure if you are in labor or if your water has broken. You have dizziness. You have mild pelvic cramps, pelvic pressure, or nagging pain in your abdominal area. You have persistent nausea, vomiting, or diarrhea. You have a bad smelling vaginal discharge. You have pain with urination. SEEK IMMEDIATE MEDICAL CARE IF:  You  have a fever. You are leaking fluid from your vagina. You have spotting or bleeding from your vagina. You have severe abdominal cramping or pain. You have rapid weight loss or gain. You have shortness of breath with chest pain. You notice sudden or extreme swelling of your face, hands, ankles, feet, or legs. You have not felt your baby move in over an hour. You have severe headaches that do not go away with medicine. You have vision changes. Document Released: 05/06/2001 Document Revised: 05/17/2013 Document Reviewed: 07/13/2012 [facility] Patient Information 2015 [place], [place]. This information is not intended to replace advice given to you by your health care provider. Make sure you discuss any questions you have with your health care provider.

## 2023-07-03 LAB — TSH: TSH: 1.41 u[IU]/mL (ref 0.450–4.500)

## 2023-07-04 LAB — CBC
Hematocrit: 35.4 % (ref 34.0–46.6)
Hemoglobin: 12 g/dL (ref 11.1–15.9)
MCH: 32.5 pg (ref 26.6–33.0)
MCHC: 33.9 g/dL (ref 31.5–35.7)
MCV: 96 fL (ref 79–97)
Platelets: 270 10*3/uL (ref 150–450)
RBC: 3.69 x10E6/uL — ABNORMAL LOW (ref 3.77–5.28)
RDW: 11.9 % (ref 11.7–15.4)
WBC: 10.4 10*3/uL (ref 3.4–10.8)

## 2023-07-04 LAB — GLUCOSE TOLERANCE, 2 HOURS W/ 1HR
Glucose, 1 hour: 132 mg/dL (ref 70–179)
Glucose, 2 hour: 121 mg/dL (ref 70–152)
Glucose, Fasting: 85 mg/dL (ref 70–91)

## 2023-07-04 LAB — ANTIBODY SCREEN

## 2023-07-04 LAB — RPR: RPR Ser Ql: NONREACTIVE

## 2023-07-04 LAB — HIV ANTIBODY (ROUTINE TESTING W REFLEX): HIV Screen 4th Generation wRfx: NONREACTIVE

## 2023-07-06 ENCOUNTER — Encounter: Payer: Self-pay | Admitting: Women's Health

## 2023-07-16 ENCOUNTER — Encounter: Payer: Medicaid Other | Admitting: Women's Health

## 2023-07-22 ENCOUNTER — Ambulatory Visit: Payer: Medicaid Other | Admitting: Advanced Practice Midwife

## 2023-07-22 VITALS — BP 97/65 | HR 118 | Wt 159.0 lb

## 2023-07-22 DIAGNOSIS — Z3480 Encounter for supervision of other normal pregnancy, unspecified trimester: Secondary | ICD-10-CM | POA: Diagnosis not present

## 2023-07-22 DIAGNOSIS — Z3A30 30 weeks gestation of pregnancy: Secondary | ICD-10-CM

## 2023-07-22 DIAGNOSIS — E039 Hypothyroidism, unspecified: Secondary | ICD-10-CM

## 2023-07-22 NOTE — Progress Notes (Signed)
   LOW-RISK PREGNANCY VISIT Patient name: Gina Alvarez MRN [id number]  Date of birth: 1990/06/02 Chief Complaint:   Routine Prenatal Visit  History of Present Illness:   Gina Alvarez is a 33 y.o. [id number] female at [redacted]w[redacted]d with an Estimated Date of Delivery: 09/24/23 being seen today for ongoing management of a low-risk pregnancy.  Today she reports  constipation . Contractions: Not present. Vag. Bleeding: None.  Movement: Present. denies leaking of fluid. Review of Systems:   Pertinent items are noted in HPI Denies abnormal vaginal discharge w/ itching/odor/irritation, headaches, visual changes, shortness of breath, chest pain, abdominal pain, severe nausea/vomiting, or problems with urination or bowel movements unless otherwise stated above. Pertinent History Reviewed:  Reviewed past medical,surgical, social, obstetrical and family history.  Reviewed problem list, medications and allergies. Physical Assessment:   Vitals:   07/22/23 1335  BP: 97/65  Pulse: (!) 118  Weight: 159 lb (72.1 kg)  Body mass index is 32.11 kg/m.        Physical Examination:   General appearance: Well appearing, and in no distress  Mental status: Alert, oriented to person, place, and time  Skin: Warm & dry  Cardiovascular: Normal heart rate noted  Respiratory: Normal respiratory effort, no distress  Abdomen: Soft, gravid, nontender  Pelvic: Cervical exam deferred         Extremities:    Fetal Status: Fetal Heart Rate (bpm): 143 Fundal Height: 30 cm Movement: Present    No results found for this or any previous visit (from the past 24 hours).  Assessment & Plan:  1) Low-risk pregnancy [id number] at [redacted]w[redacted]d with an Estimated Date of Delivery: 09/24/23   2) Hypothyroidism, no meds; 27wk TSH 1.410  3) Constipation, tips given (stool softener, Miralax, liquids, fiber)   Meds: No orders of the defined types were placed in this encounter.  Labs/procedures today: none  Plan:  Continue routine obstetrical  care   Reviewed: Preterm labor symptoms and general obstetric precautions including but not limited to vaginal bleeding, contractions, leaking of fluid and fetal movement were reviewed in detail with the patient.  All questions were answered. Has home bp cuff. Check bp weekly, let us know if >140/90.   Follow-up: Return for As scheduled.  No orders of the defined types were placed in this encounter.  Arabella Merles CNM 07/22/2023 1:51 PM

## 2023-07-22 NOTE — Patient Instructions (Signed)
[facility], thank you for choosing our office today! We appreciate the opportunity to meet your healthcare needs. You may receive a short survey by mail, e-mail, or through [employer]. If you are happy with your care we would appreciate if you could take just a few minutes to complete the survey questions. We read all of your comments and take your feedback very seriously. Thank you again for choosing our office.  Center for [employer] Team at [facility]  [facility] at [facility] ([address] [place], [place] [address]) Entrance C, located off of [address] Free 24/7 valet parking   CLASSES: Go to [email or web address] to register for classes (childbirth, breastfeeding, waterbirth, infant CPR, daddy bootcamp, etc.)  Call the office [phone]) or go to [facility] if: You begin to have strong, frequent contractions Your water breaks.  Sometimes it is a big gush of fluid, sometimes it is just a trickle that keeps getting your panties wet or running down your legs You have vaginal bleeding.  It is normal to have a small amount of spotting if your cervix was checked.  You don't feel your baby moving like normal.  If you don't, get you something to eat and drink and lay down and focus on feeling your baby move.   If your baby is still not moving like normal, you should call the office or go to [facility].  Call the office [phone]) or go to [facility] hospital for these signs of pre-eclampsia: Severe headache that does not go away with Tylenol Visual changes- seeing spots, double, blurred vision Pain under your right breast or upper abdomen that does not go away with Tums or heartburn medicine Nausea and/or vomiting Severe swelling in your hands, feet, and face   Tdap Vaccine It is recommended that you get the Tdap vaccine during the third trimester of EACH pregnancy to help protect your baby from getting pertussis (whooping cough) 27-36 weeks is the BEST time to do  this so that you can pass the protection on to your baby. During pregnancy is better than after pregnancy, but if you are unable to get it during pregnancy it will be offered at the hospital.  You can get this vaccine with [place], at the health department, your family doctor, or some local pharmacies Everyone who will be around your baby should also be up-to-date on their vaccines before the baby comes. Adults (who are not pregnant) only need 1 dose of Tdap during adulthood.   [facility] Pediatricians/Family Doctors [facility]): [address] Colette Ribas, [phone]           [facility]: [address], [phone]                [facility]): [address], [phone] (call to ask if accepting patients) [facility] Department: [address], [place], [phone]    [facility] Pediatricians/Family Doctors [facility] [phone], [phone] [facility]: [address] [place], [phone] [facility]: [address], [phone]  [facility] Doctors  [facility]): [phone] [facility]: [address], [phone]   [facility]: [address] [phone]  [facility] Family Doctors  [facility]: [phone], [phone]  Home Blood Pressure Monitoring for Patients   Your provider has recommended that you check your  blood pressure (BP) at least once a week at home. If you do not have a blood pressure cuff at home, one will be provided for you. Contact your provider if you have not received your monitor within 1 week.   Helpful Tips for Accurate Home Blood Pressure Checks  Don't smoke, exercise, or drink caffeine 30 minutes before checking your BP Use the restroom before checking your BP (a full bladder can raise your  pressure) Relax in a comfortable upright chair Feet on the ground Left arm resting comfortably on a flat surface at the level of your heart Legs uncrossed Back supported Sit quietly and don't talk Place the cuff on your bare arm Adjust snuggly, so that only two fingertips can fit between your skin and the top of the cuff Check 2 readings separated by at least one minute Keep a log of your BP readings For a visual, please reference this diagram: http://ccnc.care/bpdiagram  Provider Name: [facility]     Phone: [phone]  Zone 1: ALL CLEAR  Continue to monitor your symptoms:  BP reading is less than 140 (top number) or less than 90 (bottom number)  No right upper stomach pain No headaches or seeing spots No feeling nauseated or throwing up No swelling in face and hands  Zone 2: CAUTION Call your doctor's office for any of the following:  BP reading is greater than 140 (top number) or greater than 90 (bottom number)  Stomach pain under your ribs in the middle or right side Headaches or seeing spots Feeling nauseated or throwing up Swelling in face and hands  Zone 3: EMERGENCY  Seek immediate medical care if you have any of the following:  BP reading is greater than160 (top number) or greater than 110 (bottom number) Severe headaches not improving with Tylenol Serious difficulty catching your breath Any worsening symptoms from Zone 2   Third Trimester of Pregnancy The third trimester is from week 29 through week 42, months 7 through 9. The third trimester is a time when the fetus is growing rapidly. At the end of the ninth month, the fetus is about 20 inches in length and weighs 6-10 pounds.  BODY CHANGES Your body goes through many changes during pregnancy. The changes vary from woman to woman.  Your weight will continue to increase. You can expect to gain 25-35 pounds (11-16 kg) by the end of the pregnancy. You may begin to get stretch [place] on your hips, abdomen,  and breasts. You may urinate more often because the fetus is moving lower into your pelvis and pressing on your bladder. You may develop or continue to have heartburn as a result of your pregnancy. You may develop constipation because certain hormones are causing the muscles that push waste through your intestines to slow down. You may develop hemorrhoids or swollen, bulging veins (varicose veins). You may have pelvic pain because of the weight gain and pregnancy hormones relaxing your joints between the bones in your pelvis. Backaches may result from overexertion of the muscles supporting your posture. You may have changes in your hair. These can include thickening of your hair, rapid growth, and changes in texture. Some women also have hair loss during or after pregnancy, or hair that feels dry or thin. Your hair will most likely return to normal after your baby is born. Your breasts will continue to grow and be tender. A yellow discharge may leak from your breasts called colostrum. Your belly button may stick out. You may   feel short of breath because of your expanding uterus. You may notice the fetus "dropping," or moving lower in your abdomen. You may have a bloody mucus discharge. This usually occurs a few days to a week before labor begins. Your cervix becomes thin and soft (effaced) near your due date. WHAT TO EXPECT AT YOUR PRENATAL EXAMS  You will have prenatal exams every 2 weeks until week 36. Then, you will have weekly prenatal exams. During a routine prenatal visit: You will be weighed to make sure you and the fetus are growing normally. Your blood pressure is taken. Your abdomen will be measured to track your baby's growth. The fetal heartbeat will be listened to. Any test results from the previous visit will be discussed. You may have a cervical check near your due date to see if you have effaced. At around 36 weeks, your caregiver will check your cervix. At the same time, your  caregiver will also perform a test on the secretions of the vaginal tissue. This test is to determine if a type of bacteria, Group B streptococcus, is present. Your caregiver will explain this further. Your caregiver may ask you: What your birth plan is. How you are feeling. If you are feeling the baby move. If you have had any abnormal symptoms, such as leaking fluid, bleeding, severe headaches, or abdominal cramping. If you have any questions. Other tests or screenings that may be performed during your third trimester include: Blood tests that check for low iron levels (anemia). Fetal testing to check the health, activity level, and growth of the fetus. Testing is done if you have certain medical conditions or if there are problems during the pregnancy. FALSE LABOR You may feel small, irregular contractions that eventually go away. These are called Braxton Hicks contractions, or false labor. Contractions may last for hours, days, or even weeks before true labor sets in. If contractions come at regular intervals, intensify, or become painful, it is best to be seen by your caregiver.  SIGNS OF LABOR  Menstrual-like cramps. Contractions that are 5 minutes apart or less. Contractions that start on the top of the uterus and spread down to the lower abdomen and back. A sense of increased pelvic pressure or back pain. A watery or bloody mucus discharge that comes from the vagina. If you have any of these signs before the 37th week of pregnancy, call your caregiver right away. You need to go to the hospital to get checked immediately. HOME CARE INSTRUCTIONS  Avoid all smoking, herbs, alcohol, and unprescribed drugs. These chemicals affect the formation and growth of the baby. Follow your caregiver's instructions regarding medicine use. There are medicines that are either safe or unsafe to take during pregnancy. Exercise only as directed by your caregiver. Experiencing uterine cramps is a good sign to  stop exercising. Continue to eat regular, healthy meals. Wear a good support bra for breast tenderness. Do not use hot tubs, steam rooms, or saunas. Wear your seat belt at all times when driving. Avoid raw meat, uncooked cheese, cat litter boxes, and soil used by cats. These carry germs that can cause birth defects in the baby. Take your prenatal vitamins. Try taking a stool softener (if your caregiver approves) if you develop constipation. Eat more high-fiber foods, such as fresh vegetables or fruit and whole grains. Drink plenty of fluids to keep your urine clear or pale yellow. Take warm sitz baths to soothe any pain or discomfort caused by hemorrhoids. Use hemorrhoid cream if   your caregiver approves. If you develop varicose veins, wear support hose. Elevate your feet for 15 minutes, 3-4 times a day. Limit salt in your diet. Avoid heavy lifting, wear low heal shoes, and practice good posture. Rest a lot with your legs elevated if you have leg cramps or low back pain. Visit your dentist if you have not gone during your pregnancy. Use a soft toothbrush to brush your teeth and be gentle when you floss. A sexual relationship may be continued unless your caregiver directs you otherwise. Do not travel far distances unless it is absolutely necessary and only with the approval of your caregiver. Take prenatal classes to understand, practice, and ask questions about the labor and delivery. Make a trial run to the hospital. Pack your hospital bag. Prepare the baby's nursery. Continue to go to all your prenatal visits as directed by your caregiver. SEEK MEDICAL CARE IF: You are unsure if you are in labor or if your water has broken. You have dizziness. You have mild pelvic cramps, pelvic pressure, or nagging pain in your abdominal area. You have persistent nausea, vomiting, or diarrhea. You have a bad smelling vaginal discharge. You have pain with urination. SEEK IMMEDIATE MEDICAL CARE IF:  You  have a fever. You are leaking fluid from your vagina. You have spotting or bleeding from your vagina. You have severe abdominal cramping or pain. You have rapid weight loss or gain. You have shortness of breath with chest pain. You notice sudden or extreme swelling of your face, hands, ankles, feet, or legs. You have not felt your baby move in over an hour. You have severe headaches that do not go away with medicine. You have vision changes. Document Released: 05/06/2001 Document Revised: 05/17/2013 Document Reviewed: 07/13/2012 [facility] Patient Information 2015 [place], [place]. This information is not intended to replace advice given to you by your health care provider. Make sure you discuss any questions you have with your health care provider.

## 2023-07-30 ENCOUNTER — Encounter: Payer: Self-pay | Admitting: Women's Health

## 2023-07-30 ENCOUNTER — Ambulatory Visit (INDEPENDENT_AMBULATORY_CARE_PROVIDER_SITE_OTHER): Payer: Medicaid Other | Admitting: Women's Health

## 2023-07-30 VITALS — BP 97/58 | HR 103 | Wt 158.0 lb

## 2023-07-30 DIAGNOSIS — Z3483 Encounter for supervision of other normal pregnancy, third trimester: Secondary | ICD-10-CM

## 2023-07-30 DIAGNOSIS — Z3A32 32 weeks gestation of pregnancy: Secondary | ICD-10-CM | POA: Diagnosis not present

## 2023-07-30 DIAGNOSIS — F418 Other specified anxiety disorders: Secondary | ICD-10-CM

## 2023-07-30 MED ORDER — SERTRALINE HCL 100 MG PO TABS: 100.0000 mg | ORAL_TABLET | Freq: Every day | ORAL | 3 refills | Status: DC

## 2023-07-30 NOTE — Progress Notes (Signed)
 LOW-RISK PREGNANCY VISIT Patient name: Gina Alvarez MRN [id number]  Date of birth: 1991/05/12 Chief Complaint:   Routine Prenatal Visit  History of Present Illness:   Gina Alvarez is a 33 y.o. [id number] female at [redacted]w[redacted]d with an Estimated Date of Delivery: 09/24/23 being seen today for ongoing management of a low-risk pregnancy.   Today she reports  irritable, moody, laid in bed all day yesterday. Denies SI/HI. Requests increase in zoloft. . Contractions: Not present. Vag. Bleeding: None.  Movement: Present. denies leaking of fluid.     07/02/2023    9:57 AM 05/05/2023   10:17 AM 03/17/2023   10:08 AM 12/10/2022    9:03 AM 11/12/2022    3:30 PM  Depression screen PHQ 2/9  Decreased Interest 1 2 2  0 0  Down, Depressed, Hopeless  1 2 0 0  PHQ - 2 Score 1 3 4  0 0  Altered sleeping  0 0 0 0  Tired, decreased energy 1 2 2  0 0  Change in appetite 0 0 0 2 0  Feeling bad or failure about yourself  1 1 1  0 0  Trouble concentrating  0 0 0 0  Moving slowly or fidgety/restless 0 0 0 0 0  Suicidal thoughts 0 0 0 0 0  PHQ-9 Score  6 7 2  0  Difficult doing work/chores     Not difficult at all        05/05/2023   10:19 AM 03/17/2023   10:08 AM 12/10/2022    9:03 AM 11/12/2022    3:33 PM  GAD 7 : Generalized Anxiety Score  Nervous, Anxious, on Edge 0 2 0 3  Control/stop worrying 2 1 0 0  Worry too much - different things 2 1 0 3  Trouble relaxing 0 1 0 0  Restless 0 0 0 3  Easily annoyed or irritable 3 3 0 0  Afraid - awful might happen 0 0 0 3  Total GAD 7 Score 7 8 0 12  Anxiety Difficulty    Very difficult      Review of Systems:   Pertinent items are noted in HPI Denies abnormal vaginal discharge w/ itching/odor/irritation, headaches, visual changes, shortness of breath, chest pain, abdominal pain, severe nausea/vomiting, or problems with urination or bowel movements unless otherwise stated above. Pertinent History Reviewed:  Reviewed past medical,surgical, social, obstetrical  and family history.  Reviewed problem list, medications and allergies. Physical Assessment:   Vitals:   07/30/23 0836  BP: (!) 97/58  Pulse: (!) 103  Weight: 158 lb (71.7 kg)  Body mass index is 31.91 kg/m.        Physical Examination:   General appearance: Well appearing, and in no distress  Mental status: Alert, oriented to person, place, and time  Skin: Warm & dry  Cardiovascular: Normal heart rate noted  Respiratory: Normal respiratory effort, no distress  Abdomen: Soft, gravid, nontender  Pelvic: Cervical exam deferred         Extremities: Edema: None  Fetal Status: Fetal Heart Rate (bpm): 144 Fundal Height: 32 cm Movement: Present    Chaperone: N/A No results found for this or any previous visit (from the past 24 hours).  Assessment & Plan:  1) Low-risk pregnancy [id number] at [redacted]w[redacted]d with an Estimated Date of Delivery: 09/24/23    2) Hypothyroidism, no meds, last TSH 1.410   3) BRCA+ s/p bilateral ppx mastectomy/implants- pp RA TLH+BSO  4) Dep/anx> increase zoloft to 100mg , has had appt w/  IBH   Meds:  Meds ordered this encounter  Medications   sertraline (ZOLOFT) 100 MG tablet    Sig: Take 1 tablet (100 mg total) by mouth daily.    Dispense:  90 tablet    Refill:  3   Labs/procedures today: none  Plan:  Continue routine obstetrical care  Next visit: prefers in person    Reviewed: Preterm labor symptoms and general obstetric precautions including but not limited to vaginal bleeding, contractions, leaking of fluid and fetal movement were reviewed in detail with the patient.  All questions were answered. Does have home bp cuff. Office bp cuff given: not applicable. Check bp weekly, let us know if consistently >140 and/or >90.  Follow-up: Return for As scheduled.  Future Appointments  Date Time Provider Department Center  08/13/2023  8:50 AM Cheral Marker, CNM [facility]  08/27/2023  8:50 AM Cheral Marker, CNM [facility]  09/03/2023  8:50 AM Cheral Marker, CNM [facility]  09/10/2023  8:50 AM Cheral Marker, CNM [facility]  09/17/2023  8:50 AM Cheral Marker, CNM [facility]  09/18/2023  8:15 AM Dillingham, Alena Bills, DO [facility] None  09/24/2023  8:50 AM Cheral Marker, CNM [facility]    No orders of the defined types were placed in this encounter.  Cheral Marker CNM, [facility] 07/30/2023 8:57 AM

## 2023-07-30 NOTE — Patient Instructions (Signed)
Gina Alvarez, thank you for choosing our office today! We appreciate the opportunity to meet your healthcare needs. You may receive a short survey by mail, e-mail, or through MyChart. If you are happy with your care we would appreciate if you could take just a few minutes to complete the survey questions. We read all of your comments and take your feedback very seriously. Thank you again for choosing our office.  [facility] [place] ([address] [place], [place] [address]) Entrance C, located off of [address] Free 24/7 valet parking   CLASSES: Go to [email or web address] to register for classes (childbirth, breastfeeding, waterbirth, infant CPR, daddy bootcamp, etc.)  Call the office ([phone]) or go to [facility] if: You begin to have strong, frequent contractions Your water breaks.  Sometimes it is a big gush of fluid, sometimes it is just a trickle that keeps getting your panties wet or running down your legs You have vaginal bleeding.  It is normal to have a small amount of spotting if your cervix was checked.  You don't feel your baby moving like normal.  If you don't, get you something to eat and drink and lay down and focus on feeling your baby move.   If your baby is still not moving like normal, you should call the office or go to [facility].  Call the office ([phone]) or go to [facility] for these signs of pre-eclampsia: Severe headache that does not go away with Tylenol Visual changes- seeing spots, double, blurred vision Pain under your right breast or upper abdomen that does not go away with Tums or heartburn medicine Nausea and/or vomiting Severe swelling in your hands, feet, and face   Tdap Vaccine It is recommended that you get the Tdap vaccine during the third trimester of EACH pregnancy to help protect your baby from getting pertussis (whooping cough) 27-36 weeks is the BEST time to do  this so that you can pass the protection on to your baby. During pregnancy is better than after pregnancy, but if you are unable to get it during pregnancy it will be offered at the hospital.  You can get this vaccine with us, at the health department, your family doctor, or some local pharmacies Everyone who will be around your baby should also be up-to-date on their vaccines before the baby comes. Adults (who are not pregnant) only need 1 dose of Tdap during adulthood.   [place] Pediatricians/Family Doctors [facility] ([facility]): [address], [phone]           [facility]: [address], [phone]                [facility] ([facility]): [address], [phone] (call to ask if accepting patients) [facility]: [address], [place], [phone]    [place] Pediatricians/Family Doctors [facility] ([facility]): [address], [phone] [facility]: [address], [phone] [facility]: [address], [phone]  [place] [facility]  [facility] ([facility]): [phone] [facility]: [address], [phone]   [place] Family Doctors [facility]: [address], [phone]  [place] [facility]  [facility]: [address], [phone]  Home Blood Pressure Monitoring for Patients   Your provider has recommended that you check your   blood pressure (BP) at least once a week at home. If you do not have a blood pressure cuff at home, one will be provided for you. Contact your provider if you have not received your monitor within 1 week.   Helpful Tips for Accurate Home Blood Pressure Checks  Don't smoke, exercise, or drink caffeine 30 minutes before checking your BP Use the restroom before checking your BP (a full bladder can raise your  pressure) Relax in a comfortable upright chair Feet on the ground Left arm resting comfortably on a flat surface at the level of your heart Legs uncrossed Back supported Sit quietly and don't talk Place the cuff on your bare arm Adjust snuggly, so that only two fingertips can fit between your skin and the top of the cuff Check 2 readings separated by at least one minute Keep a log of your BP readings For a visual, please reference this diagram: http://ccnc.care/bpdiagram  Provider Name: [facility]     Phone: [phone]  Zone 1: ALL CLEAR  Continue to monitor your symptoms:  BP reading is less than 140 (top number) or less than 90 (bottom number)  No right upper stomach pain No headaches or seeing spots No feeling nauseated or throwing up No swelling in face and hands  Zone 2: CAUTION Call your doctor's office for any of the following:  BP reading is greater than 140 (top number) or greater than 90 (bottom number)  Stomach pain under your ribs in the middle or right side Headaches or seeing spots Feeling nauseated or throwing up Swelling in face and hands  Zone 3: EMERGENCY  Seek immediate medical care if you have any of the following:  BP reading is greater than160 (top number) or greater than 110 (bottom number) Severe headaches not improving with Tylenol Serious difficulty catching your breath Any worsening symptoms from Zone 2  Preterm Labor and Birth Information  The normal length of a pregnancy is 39-41 weeks. Preterm labor is when labor starts before 37 completed weeks of pregnancy. What are the risk factors for preterm labor? Preterm labor is more likely to occur in women who: Have certain infections during pregnancy such as a bladder infection, sexually transmitted infection, or infection inside the uterus (chorioamnionitis). Have a shorter-than-normal cervix. Have gone into preterm labor before. Have had surgery on their cervix. Are younger than age 17  or older than age 35. Are African American. Are pregnant with twins or multiple babies (multiple gestation). Take street drugs or smoke while pregnant. Do not gain enough weight while pregnant. Became pregnant shortly after having been pregnant. What are the symptoms of preterm labor? Symptoms of preterm labor include: Cramps similar to those that can happen during a menstrual period. The cramps may happen with diarrhea. Pain in the abdomen or lower back. Regular uterine contractions that may feel like tightening of the abdomen. A feeling of increased pressure in the pelvis. Increased watery or bloody mucus discharge from the vagina. Water breaking (ruptured amniotic sac). Why is it important to recognize signs of preterm labor? It is important to recognize signs of preterm labor because babies who are born prematurely may not be fully developed. This can put them at an increased risk for: Long-term (chronic) heart and lung problems. Difficulty immediately after birth with regulating body systems, including blood sugar, body temperature, heart rate, and breathing rate. Bleeding in the brain. Cerebral palsy. Learning difficulties. Death. These risks are highest for babies who are born before 34 weeks   of pregnancy. How is preterm labor treated? Treatment depends on the length of your pregnancy, your condition, and the health of your baby. It may involve: Having a stitch (suture) placed in your cervix to prevent your cervix from opening too early (cerclage). Taking or being given medicines, such as: Hormone medicines. These may be given early in pregnancy to help support the pregnancy. Medicine to stop contractions. Medicines to help mature the baby's lungs. These may be prescribed if the risk of delivery is high. Medicines to prevent your baby from developing cerebral palsy. If the labor happens before 34 weeks of pregnancy, you may need to stay in the hospital. What should I do if I  think I am in preterm labor? If you think that you are going into preterm labor, call your health care provider right away. How can I prevent preterm labor in future pregnancies? To increase your chance of having a full-term pregnancy: Do not use any tobacco products, such as cigarettes, chewing tobacco, and e-cigarettes. If you need help quitting, ask your health care provider. Do not use street drugs or medicines that have not been prescribed to you during your pregnancy. Talk with your health care provider before taking any herbal supplements, even if you have been taking them regularly. Make sure you gain a healthy amount of weight during your pregnancy. Watch for infection. If you think that you might have an infection, get it checked right away. Make sure to tell your health care provider if you have gone into preterm labor before. This information is not intended to replace advice given to you by your health care provider. Make sure you discuss any questions you have with your health care provider. Document Revised: 09/03/2018 Document Reviewed: 10/03/2015 Elsevier Patient Education  2020 Elsevier Inc.   

## 2023-08-13 ENCOUNTER — Ambulatory Visit: Payer: Medicaid Other | Admitting: Women's Health

## 2023-08-13 ENCOUNTER — Encounter: Payer: Self-pay | Admitting: Women's Health

## 2023-08-13 VITALS — BP 105/71 | HR 90 | Wt 158.8 lb

## 2023-08-13 DIAGNOSIS — Z3A34 34 weeks gestation of pregnancy: Secondary | ICD-10-CM

## 2023-08-13 DIAGNOSIS — Z3483 Encounter for supervision of other normal pregnancy, third trimester: Secondary | ICD-10-CM | POA: Diagnosis not present

## 2023-08-13 NOTE — Patient Instructions (Signed)
Gina Alvarez, thank you for choosing our office today! We appreciate the opportunity to meet your healthcare needs. You may receive a short survey by mail, e-mail, or through MyChart. If you are happy with your care we would appreciate if you could take just a few minutes to complete the survey questions. We read all of your comments and take your feedback very seriously. Thank you again for choosing our office.  [facility] [place] ([address] [place], [place] [address]) Entrance C, located off of [address] Free 24/7 valet parking   CLASSES: Go to [email or web address] to register for classes (childbirth, breastfeeding, waterbirth, infant CPR, daddy bootcamp, etc.)  Call the office ([phone]) or go to [facility] if: You begin to have strong, frequent contractions Your water breaks.  Sometimes it is a big gush of fluid, sometimes it is just a trickle that keeps getting your panties wet or running down your legs You have vaginal bleeding.  It is normal to have a small amount of spotting if your cervix was checked.  You don't feel your baby moving like normal.  If you don't, get you something to eat and drink and lay down and focus on feeling your baby move.   If your baby is still not moving like normal, you should call the office or go to [facility].  Call the office ([phone]) or go to [facility] for these signs of pre-eclampsia: Severe headache that does not go away with Tylenol Visual changes- seeing spots, double, blurred vision Pain under your right breast or upper abdomen that does not go away with Tums or heartburn medicine Nausea and/or vomiting Severe swelling in your hands, feet, and face   Tdap Vaccine It is recommended that you get the Tdap vaccine during the third trimester of EACH pregnancy to help protect your baby from getting pertussis (whooping cough) 27-36 weeks is the BEST time to do  this so that you can pass the protection on to your baby. During pregnancy is better than after pregnancy, but if you are unable to get it during pregnancy it will be offered at the hospital.  You can get this vaccine with us, at the health department, your family doctor, or some local pharmacies Everyone who will be around your baby should also be up-to-date on their vaccines before the baby comes. Adults (who are not pregnant) only need 1 dose of Tdap during adulthood.   [place] Pediatricians/Family Doctors [facility] ([facility]): [address], [phone]           [facility]: [address], [phone]                [facility] ([facility]): [address], [phone] (call to ask if accepting patients) [facility]: [address], [place], [phone]    [place] Pediatricians/Family Doctors [facility] ([facility]): [address], [phone] [facility]: [address], [phone] [facility]: [address], [phone]  [place] [facility]  [facility] ([facility]): [phone] [facility]: [address], [phone]   [place] Family Doctors [facility]: [address], [phone]  [place] [facility]  [facility]: [address], [phone]  Home Blood Pressure Monitoring for Patients   Your provider has recommended that you check your   blood pressure (BP) at least once a week at home. If you do not have a blood pressure cuff at home, one will be provided for you. Contact your provider if you have not received your monitor within 1 week.   Helpful Tips for Accurate Home Blood Pressure Checks  Don't smoke, exercise, or drink caffeine 30 minutes before checking your BP Use the restroom before checking your BP (a full bladder can raise your  pressure) Relax in a comfortable upright chair Feet on the ground Left arm resting comfortably on a flat surface at the level of your heart Legs uncrossed Back supported Sit quietly and don't talk Place the cuff on your bare arm Adjust snuggly, so that only two fingertips can fit between your skin and the top of the cuff Check 2 readings separated by at least one minute Keep a log of your BP readings For a visual, please reference this diagram: http://ccnc.care/bpdiagram  Provider Name: [facility]     Phone: [phone]  Zone 1: ALL CLEAR  Continue to monitor your symptoms:  BP reading is less than 140 (top number) or less than 90 (bottom number)  No right upper stomach pain No headaches or seeing spots No feeling nauseated or throwing up No swelling in face and hands  Zone 2: CAUTION Call your doctor's office for any of the following:  BP reading is greater than 140 (top number) or greater than 90 (bottom number)  Stomach pain under your ribs in the middle or right side Headaches or seeing spots Feeling nauseated or throwing up Swelling in face and hands  Zone 3: EMERGENCY  Seek immediate medical care if you have any of the following:  BP reading is greater than160 (top number) or greater than 110 (bottom number) Severe headaches not improving with Tylenol Serious difficulty catching your breath Any worsening symptoms from Zone 2  Preterm Labor and Birth Information  The normal length of a pregnancy is 39-41 weeks. Preterm labor is when labor starts before 37 completed weeks of pregnancy. What are the risk factors for preterm labor? Preterm labor is more likely to occur in women who: Have certain infections during pregnancy such as a bladder infection, sexually transmitted infection, or infection inside the uterus (chorioamnionitis). Have a shorter-than-normal cervix. Have gone into preterm labor before. Have had surgery on their cervix. Are younger than age 17  or older than age 35. Are African American. Are pregnant with twins or multiple babies (multiple gestation). Take street drugs or smoke while pregnant. Do not gain enough weight while pregnant. Became pregnant shortly after having been pregnant. What are the symptoms of preterm labor? Symptoms of preterm labor include: Cramps similar to those that can happen during a menstrual period. The cramps may happen with diarrhea. Pain in the abdomen or lower back. Regular uterine contractions that may feel like tightening of the abdomen. A feeling of increased pressure in the pelvis. Increased watery or bloody mucus discharge from the vagina. Water breaking (ruptured amniotic sac). Why is it important to recognize signs of preterm labor? It is important to recognize signs of preterm labor because babies who are born prematurely may not be fully developed. This can put them at an increased risk for: Long-term (chronic) heart and lung problems. Difficulty immediately after birth with regulating body systems, including blood sugar, body temperature, heart rate, and breathing rate. Bleeding in the brain. Cerebral palsy. Learning difficulties. Death. These risks are highest for babies who are born before 34 weeks   of pregnancy. How is preterm labor treated? Treatment depends on the length of your pregnancy, your condition, and the health of your baby. It may involve: Having a stitch (suture) placed in your cervix to prevent your cervix from opening too early (cerclage). Taking or being given medicines, such as: Hormone medicines. These may be given early in pregnancy to help support the pregnancy. Medicine to stop contractions. Medicines to help mature the baby's lungs. These may be prescribed if the risk of delivery is high. Medicines to prevent your baby from developing cerebral palsy. If the labor happens before 34 weeks of pregnancy, you may need to stay in the hospital. What should I do if I  think I am in preterm labor? If you think that you are going into preterm labor, call your health care provider right away. How can I prevent preterm labor in future pregnancies? To increase your chance of having a full-term pregnancy: Do not use any tobacco products, such as cigarettes, chewing tobacco, and e-cigarettes. If you need help quitting, ask your health care provider. Do not use street drugs or medicines that have not been prescribed to you during your pregnancy. Talk with your health care provider before taking any herbal supplements, even if you have been taking them regularly. Make sure you gain a healthy amount of weight during your pregnancy. Watch for infection. If you think that you might have an infection, get it checked right away. Make sure to tell your health care provider if you have gone into preterm labor before. This information is not intended to replace advice given to you by your health care provider. Make sure you discuss any questions you have with your health care provider. Document Revised: 09/03/2018 Document Reviewed: 10/03/2015 Elsevier Patient Education  2020 Elsevier Inc.   
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
## 2023-08-17 ENCOUNTER — Other Ambulatory Visit: Payer: Self-pay | Admitting: Family Medicine

## 2023-08-17 ENCOUNTER — Other Ambulatory Visit: Payer: Self-pay | Admitting: Obstetrics & Gynecology

## 2023-08-17 DIAGNOSIS — Z9013 Acquired absence of bilateral breasts and nipples: Secondary | ICD-10-CM

## 2023-08-17 DIAGNOSIS — M62838 Other muscle spasm: Secondary | ICD-10-CM

## 2023-08-24 ENCOUNTER — Other Ambulatory Visit: Payer: Self-pay

## 2023-08-24 ENCOUNTER — Encounter (HOSPITAL_COMMUNITY): Payer: Self-pay | Admitting: Obstetrics and Gynecology

## 2023-08-24 ENCOUNTER — Inpatient Hospital Stay (HOSPITAL_COMMUNITY)
Admission: AD | Admit: 2023-08-24 | Discharge: 2023-08-24 | Disposition: A | Discharge status: Home / Self Care | Attending: Obstetrics and Gynecology | Admitting: Obstetrics and Gynecology

## 2023-08-24 DIAGNOSIS — Z3A35 35 weeks gestation of pregnancy: Secondary | ICD-10-CM | POA: Diagnosis not present

## 2023-08-24 DIAGNOSIS — O368130 Decreased fetal movements, third trimester, not applicable or unspecified: Secondary | ICD-10-CM | POA: Diagnosis not present

## 2023-08-24 LAB — URINALYSIS, ROUTINE W REFLEX MICROSCOPIC
Bacteria, UA: NONE SEEN
Bilirubin Urine: NEGATIVE
Glucose, UA: NEGATIVE mg/dL
Hgb urine dipstick: NEGATIVE
Ketones, ur: NEGATIVE mg/dL
Nitrite: NEGATIVE
Protein, ur: NEGATIVE mg/dL
Specific Gravity, Urine: 1.005 (ref 1.005–1.030)
pH: 6 (ref 5.0–8.0)

## 2023-08-24 LAB — POCT FERN TEST

## 2023-08-24 LAB — RUPTURE OF MEMBRANE (ROM)PLUS: Rom Plus: NEGATIVE

## 2023-08-24 NOTE — MAU Note (Signed)
 DFM and pressure

## 2023-08-24 NOTE — MAU Provider Note (Signed)
 History     CSN: [id number]  Arrival date and time: 08/24/23 1420   Event Date/Time   First Provider Initiated Contact with Patient    Chief Complaint  Patient presents with   Decreased Fetal Movement   Back Pain    HPI  Gina Alvarez is a 33 y.o. [id number] at [redacted]w[redacted]d who presents to the MAU for decreased fetal movement. Noticed decreased movement since 0645 this AM. No changes to her usual regimen. She typically wakes up around 0645, gets kids ready for school, goes to work at 0800 and none of this was different this AM. She denies contractions, VB. Reports some leaking of fluid, though is not regular and she feels it may be urine vs increased vaginal discharge. Reports since presentation to MAU (approx 1 hour), has felt 10 separate FM and FM now back to normal.  Past Medical History:  Diagnosis Date   Anxiety    Depression    Irregular menstrual bleeding 09/26/2013    Past Surgical History:  Procedure Laterality Date   BREAST RECONSTRUCTION WITH PLACEMENT OF TISSUE EXPANDER AND FLEX HD (ACELLULAR HYDRATED DERMIS) Bilateral 01/09/2022   Procedure: BILATERAL BREAST RECONSTRUCTION WITH PLACEMENT OF TISSUE EXPANDER AND FLEX HD (ACELLULAR HYDRATED DERMIS);  Surgeon: Peggye Form, DO;  Location: [facility];  Service: Plastics;  Laterality: Bilateral;   CHOLECYSTECTOMY N/A 09/14/2020   Procedure: LAPAROSCOPIC CHOLECYSTECTOMY;  Surgeon: Lucretia Roers, MD;  Location: [facility];  Service: General;  Laterality: N/A;   EXTRACORPOREAL SHOCK WAVE LITHOTRIPSY Right 06/07/2020   Procedure: EXTRACORPOREAL SHOCK WAVE LITHOTRIPSY (ESWL);  Surgeon: Rene Paci, MD;  Location: [facility];  Service: Urology;  Laterality: Right;   I & D EXTREMITY Right 02/14/2020   Procedure: IRRIGATION AND DEBRIDEMENT RIGHT RING FINGER WITH NAILBED REPAIR;  Surgeon: Betha Loa, MD;  Location: [facility];  Service: Orthopedics;  Laterality: Right;   NO PAST SURGERIES      PERCUTANEOUS PINNING Right 02/14/2020   Procedure: PERCUTANEOUS PINNING EXTREMITY;  Surgeon: Betha Loa, MD;  Location: [facility];  Service: Orthopedics;  Laterality: Right;   REMOVAL OF TISSUE EXPANDER AND PLACEMENT OF IMPLANT Bilateral 05/28/2022   Procedure: REMOVAL OF TISSUE EXPANDER AND PLACEMENT OF IMPLANT;  Surgeon: Peggye Form, DO;  Location: [facility];  Service: Plastics;  Laterality: Bilateral;   SIMPLE MASTECTOMY WITH AXILLARY SENTINEL NODE BIOPSY Bilateral 01/09/2022   Procedure: BILATERAL TOTAL MASTECTOMY;  Surgeon: Abigail Miyamoto, MD;  Location: [facility];  Service: General;  Laterality: Bilateral;  LMA    Family History  Problem Relation Age of Onset   Breast cancer Mother        dx 41-44; BRCA2+; stage III w/ chemo and radiation   Cancer Mother    Asthma Daughter    Lupus Paternal Grandmother        d. 24   Skin cancer Maternal Grandmother        "skin discoloration on arms" - not spotty   Mental illness Maternal Aunt     Social History   Tobacco Use   Smoking status: Former    Current packs/day: 0.00    Average packs/day: 0.5 packs/day for 8.0 years (4.0 ttl pk-yrs)    Types: Cigarettes    Start date: 12/26/2009    Quit date: 12/26/2017    Years since quitting: 5.6   Smokeless tobacco: Never  Vaping Use   Vaping status: Never Used  Substance Use Topics   Alcohol use:  Never    Comment: maybe 1 drink per month   Drug use: Never    Allergies: No Known Allergies  Medications Prior to Admission  Medication Sig Dispense Refill Last Dose/Taking   pantoprazole (PROTONIX) 40 MG tablet Take 1 tablet (40 mg total) by mouth daily. 30 tablet 4 08/23/2023   Prenatal MV & Min w/FA-DHA (PRENATAL ADULT GUMMY/DHA/FA PO) Take by mouth daily.   08/23/2023   sertraline (ZOLOFT) 100 MG tablet Take 1 tablet (100 mg total) by mouth daily. 90 tablet 3 08/23/2023   cyclobenzaprine (FLEXERIL) 10 MG tablet Take 1 tablet (10 mg total) by mouth 3  (three) times daily as needed (headache). (Patient not taking: Reported on 08/13/2023) 30 tablet 0    levocetirizine (XYZAL) 5 MG tablet TAKE ONE TABLET BY MOUTH EVERY DAY 90 tablet 1    levothyroxine (SYNTHROID) 50 MCG tablet Take 1 tablet (50 mcg total) by mouth daily. (Patient not taking: Reported on 03/17/2023) 90 tablet 3    ondansetron (ZOFRAN-ODT) 8 MG disintegrating tablet Take 1 tablet (8 mg total) by mouth every 8 (eight) hours as needed for nausea or vomiting. 30 tablet 1     ROS reviewed and pertinent positives and negatives as documented in HPI.  Physical Exam   Blood pressure 108/66, pulse 95, temperature 98 F (36.7 C), temperature source Oral, resp. rate 16, height 4\' 11"  (1.499 m), weight 73.8 kg, last menstrual period 12/18/2022.  Physical Exam Constitutional:      General: She is not in acute distress.    Appearance: Normal appearance. She is not ill-appearing.  HENT:     Head: Normocephalic and atraumatic.  Cardiovascular:     Rate and Rhythm: Normal rate.  Pulmonary:     Effort: Pulmonary effort is normal.     Breath sounds: Normal breath sounds.  Abdominal:     Palpations: Abdomen is soft.     Tenderness: There is no abdominal tenderness. There is no guarding.  Musculoskeletal:        General: Normal range of motion.  Skin:    General: Skin is warm and dry.     Findings: No rash.  Neurological:     General: No focal deficit present.     Mental Status: She is alert and oriented to person, place, and time.   EFM: 145/mod/+a/-d Toco: None  MAU Course  Procedures  MDM 32 y.o. [id number] at [redacted]w[redacted]d presenting for DFM, which has since resolved and now describes very active fetus. She has a reactive NST. She also c/o LOF and had a negative fern and a negative ROMplus. She was given information on kick counts and return precautions.   Assessment and Plan  Decreased fetal movements in third trimester, single or unspecified fetus NST reactive Reports active fetus  on my evaluation Return precautions given   Sundra Aland, MD OB Fellow, Faculty Practice [facility], Center for [facility]  08/24/23, 3:39 PM

## 2023-08-26 ENCOUNTER — Encounter: Payer: Self-pay | Admitting: Family Medicine

## 2023-08-27 ENCOUNTER — Ambulatory Visit: Payer: Medicaid Other | Admitting: Women's Health

## 2023-08-27 ENCOUNTER — Other Ambulatory Visit (HOSPITAL_COMMUNITY)
Admission: RE | Admit: 2023-08-27 | Discharge: 2023-08-27 | Disposition: A | Discharge status: Home / Self Care | Source: Ambulatory Visit | Attending: Women's Health | Admitting: Women's Health

## 2023-08-27 ENCOUNTER — Encounter: Payer: Self-pay | Admitting: Women's Health

## 2023-08-27 VITALS — BP 103/71 | HR 108

## 2023-08-27 DIAGNOSIS — Z3A36 36 weeks gestation of pregnancy: Secondary | ICD-10-CM | POA: Diagnosis not present

## 2023-08-27 DIAGNOSIS — Z3483 Encounter for supervision of other normal pregnancy, third trimester: Secondary | ICD-10-CM | POA: Diagnosis not present

## 2023-08-27 DIAGNOSIS — Z3480 Encounter for supervision of other normal pregnancy, unspecified trimester: Secondary | ICD-10-CM | POA: Insufficient documentation

## 2023-08-27 NOTE — Progress Notes (Signed)
 LOW-RISK PREGNANCY VISIT Patient name: Gina Alvarez MRN [id number]  Date of birth: 12/04/1990 Chief Complaint:   Routine Prenatal Visit  History of Present Illness:   Gina Alvarez is a 33 y.o. [id number] female at [redacted]w[redacted]d with an Estimated Date of Delivery: 09/24/23 being seen today for ongoing management of a low-risk pregnancy.   Today she reports  'head cold' x 1d. Congestion, nasal drainage, sore throat. No fever/chills. . Contractions: Irregular. Vag. Bleeding: None.  Movement: Present. denies leaking of fluid.     07/02/2023    9:57 AM 05/05/2023   10:17 AM 03/17/2023   10:08 AM 12/10/2022    9:03 AM 11/12/2022    3:30 PM  Depression screen PHQ 2/9  Decreased Interest 1 2 2  0 0  Down, Depressed, Hopeless  1 2 0 0  PHQ - 2 Score 1 3 4  0 0  Altered sleeping  0 0 0 0  Tired, decreased energy 1 2 2  0 0  Change in appetite 0 0 0 2 0  Feeling bad or failure about yourself  1 1 1  0 0  Trouble concentrating  0 0 0 0  Moving slowly or fidgety/restless 0 0 0 0 0  Suicidal thoughts 0 0 0 0 0  PHQ-9 Score  6 7 2  0  Difficult doing work/chores     Not difficult at all        05/05/2023   10:19 AM 03/17/2023   10:08 AM 12/10/2022    9:03 AM 11/12/2022    3:33 PM  GAD 7 : Generalized Anxiety Score  Nervous, Anxious, on Edge 0 2 0 3  Control/stop worrying 2 1 0 0  Worry too much - different things 2 1 0 3  Trouble relaxing 0 1 0 0  Restless 0 0 0 3  Easily annoyed or irritable 3 3 0 0  Afraid - awful might happen 0 0 0 3  Total GAD 7 Score 7 8 0 12  Anxiety Difficulty    Very difficult      Review of Systems:   Pertinent items are noted in HPI Denies abnormal vaginal discharge w/ itching/odor/irritation, headaches, visual changes, shortness of breath, chest pain, abdominal pain, severe nausea/vomiting, or problems with urination or bowel movements unless otherwise stated above. Pertinent History Reviewed:  Reviewed past medical,surgical, social, obstetrical and family  history.  Reviewed problem list, medications and allergies. Physical Assessment:   Vitals:   08/27/23 0859  BP: 103/71  Pulse: (!) 108  There is no height or weight on file to calculate BMI.        Physical Examination:   General appearance: Well appearing, and in no distress  Mental status: Alert, oriented to person, place, and time  Skin: Warm & dry  Cardiovascular: Normal heart rate noted  Respiratory: Normal respiratory effort, no distress  Abdomen: Soft, gravid, nontender  Pelvic: Cervical exam performed  Dilation: 3 Effacement (%): 50 Station: -3, thick clumpy d/c on exam fingers c/w yeast- pt states she has been itching  Extremities: Edema: None  Fetal Status: Fetal Heart Rate (bpm): 142 Fundal Height: 35 cm Movement: Present Presentation: Vertex  Chaperone: Latisha Cresenzo No results found for this or any previous visit (from the past 24 hours).  Assessment & Plan:  1) Low-risk pregnancy [id number] at [redacted]w[redacted]d with an Estimated Date of Delivery: 09/24/23   2) Allergies vs cold, gave printed info/relief measures  3) Vaginal candida> otc monistat 7  4) Hypothyroidism, no meds,  last TSH 1.410   5) BRCA+ s/p bilateral ppx mastectomy/implants- pp RA TLH+BSO   6) Dep/anx> on meds/IBH   Meds: No orders of the defined types were placed in this encounter.  Labs/procedures today: GBS, GC/CT, and SVE  Plan:  Continue routine obstetrical care  Next visit: prefers in person    Reviewed: Preterm labor symptoms and general obstetric precautions including but not limited to vaginal bleeding, contractions, leaking of fluid and fetal movement were reviewed in detail with the patient.  All questions were answered. Does have home bp cuff. Office bp cuff given: not applicable. Check bp weekly, let us know if consistently >140 and/or >90.  Follow-up: Return for As scheduled.  Future Appointments  Date Time Provider Department Center  09/03/2023  8:50 AM Cheral Marker, CNM [facility]  [facility]  09/10/2023  8:50 AM Cheral Marker, CNM [facility]  09/17/2023  8:50 AM Cheral Marker, CNM [facility]  09/24/2023  8:50 AM Cheral Marker, CNM [facility]  10/20/2023  9:45 AM Dillingham, Alena Bills, DO [facility] None    Orders Placed This Encounter  Procedures   Culture, beta strep (group b only)   Cheral Marker CNM, [facility] 08/27/2023 9:18 AM

## 2023-08-27 NOTE — Patient Instructions (Addendum)
 Gina Alvarez, thank you for choosing our office today! We appreciate the opportunity to meet your healthcare needs. You may receive a short survey by mail, e-mail, or through [employer]. If you are happy with your care we would appreciate if you could take just a few minutes to complete the survey questions. We read all of your comments and take your feedback very seriously. Thank you again for choosing our office.  Center for [employer] Team at [facility]  [facility] ([address] [place], [place] [address]) Entrance C, located off of [address] Free 24/7 valet parking   CLASSES: Go to [email or web address] to register for classes (childbirth, breastfeeding, waterbirth, infant CPR, daddy bootcamp, etc.)  For cold symptoms/allergies Claritin or Zyrtec Humidifier and saline nasal spray for nasal congestion Regular robitussin, cough drops for cough Warm salt water gargles for sore throat Mucinex with lots of water to help you cough up the mucous in your chest if needed Drink plenty of fluids and stay hydrated! Wash your hands frequently. Call if you are not improving by 7-10 days.     Call the office [phone]) or go to [facility] if: You begin to have strong, frequent contractions Your water breaks.  Sometimes it is a big gush of fluid, sometimes it is just a trickle that keeps getting your panties wet or running down your legs You have vaginal bleeding.  It is normal to have a small amount of spotting if your cervix was checked.  You don't feel your baby moving like normal.  If you don't, get you something to eat and drink and lay down and focus on feeling your baby move.   If your baby is still not moving like normal, you should call the office or go to [facility].  Call the office [phone]) or go to [facility] hospital for these signs of pre-eclampsia: Severe headache that does not go away with Tylenol Visual changes- seeing spots, double,  blurred vision Pain under your right breast or upper abdomen that does not go away with Tums or heartburn medicine Nausea and/or vomiting Severe swelling in your hands, feet, and face   Tdap Vaccine It is recommended that you get the Tdap vaccine during the third trimester of EACH pregnancy to help protect your baby from getting pertussis (whooping cough) 27-36 weeks is the BEST time to do this so that you can pass the protection on to your baby. During pregnancy is better than after pregnancy, but if you are unable to get it during pregnancy it will be offered at the hospital.  You can get this vaccine with [place], at the health department, your family doctor, or some local pharmacies Everyone who will be around your baby should also be up-to-date on their vaccines before the baby comes. Adults (who are not pregnant) only need 1 dose of Tdap during adulthood.   [facility] Pediatricians/Family Doctors  P[facility]): [address]. Colette Ribas, [phone]           [facility]: [address], [phone]                [facility] Medicine [facility]): [address], [phone] (call to ask if accepting patients) [facility] Department: [address], [place], [phone]    [facility] Pediatricians/Family Doctors [facility]): [phone] [address], [phone] [facility]: [address] [place], [phone] [facility]: [phone]  [address], [phone]  [facility] Doctors  [facility]): [phone] [facility]: [address], [phone]   [facility] Doctors [facility]: [address] [phone]  [facility] Doctors  [facility]: [phone], [phone]  Home Blood Pressure Monitoring for Patients   Your provider has recommended that you check your blood pressure  (BP) at least once a week at home. If you do not have a blood pressure cuff at home, one will be provided for you. Contact your provider if you have not received your monitor within 1 week.   Helpful Tips for Accurate Home Blood Pressure Checks  Don't smoke, exercise, or drink caffeine 30 minutes before checking your BP Use the restroom before checking your BP (a full bladder can raise your pressure) Relax in a comfortable upright chair Feet on the ground Left arm resting comfortably on a flat surface at the level of your heart Legs uncrossed Back supported Sit quietly and don't talk Place the cuff on your bare arm Adjust snuggly, so that only two fingertips can fit between your skin and the top of the cuff Check 2 readings separated by at least one minute Keep a log of your BP readings For a visual, please reference this diagram: http://ccnc.care/bpdiagram  Provider Name: [facility]     Phone: [phone]  Zone 1: ALL CLEAR  Continue to monitor your symptoms:  BP reading is less than 140 (top number) or less than 90 (bottom number)  No right upper stomach pain No headaches or seeing spots No feeling nauseated or throwing up No swelling in face and hands  Zone 2: CAUTION Call your doctor's office for any of the following:  BP reading is greater than 140 (top number) or greater than 90 (bottom number)  Stomach pain under your ribs in the middle or right side Headaches or seeing spots Feeling nauseated or throwing up Swelling in face and hands  Zone 3: EMERGENCY  Seek immediate medical care if you have any of the following:  BP reading is greater than160 (top number) or greater than 110 (bottom number) Severe headaches not improving with Tylenol Serious difficulty catching your breath Any worsening symptoms from Zone 2  Preterm Labor and Birth Information  The normal length of a pregnancy is 39-41 weeks. Preterm labor is when labor starts before 37 completed weeks of  pregnancy. What are the risk factors for preterm labor? Preterm labor is more likely to occur in women who: Have certain infections during pregnancy such as a bladder infection, sexually transmitted infection, or infection inside the uterus (chorioamnionitis). Have a shorter-than-normal cervix. Have gone into preterm labor before. Have had surgery on their cervix. Are younger than age 14 or older than age 76. Are African American. Are pregnant with twins or multiple babies (multiple gestation). Take street drugs or smoke while pregnant. Do not gain enough weight while pregnant. Became pregnant shortly after having been pregnant. What are the symptoms of preterm labor? Symptoms of preterm labor include: Cramps similar to those that can happen during a menstrual period. The cramps may happen with diarrhea. Pain in the abdomen or lower back. Regular uterine contractions that may feel like tightening of the abdomen. A feeling of increased pressure in the pelvis. Increased watery or bloody mucus discharge from the vagina. Water breaking (ruptured amniotic sac). Why is it important to recognize signs of preterm labor? It is important to recognize signs  of preterm labor because babies who are born prematurely may not be fully developed. This can put them at an increased risk for: Long-term (chronic) heart and lung problems. Difficulty immediately after birth with regulating body systems, including blood sugar, body temperature, heart rate, and breathing rate. Bleeding in the brain. Cerebral palsy. Learning difficulties. Death. These risks are highest for babies who are born before 34 weeks of pregnancy. How is preterm labor treated? Treatment depends on the length of your pregnancy, your condition, and the health of your baby. It may involve: Having a stitch (suture) placed in your cervix to prevent your cervix from opening too early (cerclage). Taking or being given medicines, such  as: Hormone medicines. These may be given early in pregnancy to help support the pregnancy. Medicine to stop contractions. Medicines to help mature the baby's lungs. These may be prescribed if the risk of delivery is high. Medicines to prevent your baby from developing cerebral palsy. If the labor happens before 34 weeks of pregnancy, you may need to stay in the hospital. What should I do if I think I am in preterm labor? If you think that you are going into preterm labor, call your health care provider right away. How can I prevent preterm labor in future pregnancies? To increase your chance of having a full-term pregnancy: Do not use any tobacco products, such as cigarettes, chewing tobacco, and e-cigarettes. If you need help quitting, ask your health care provider. Do not use street drugs or medicines that have not been prescribed to you during your pregnancy. Talk with your health care provider before taking any herbal supplements, even if you have been taking them regularly. Make sure you gain a healthy amount of weight during your pregnancy. Watch for infection. If you think that you might have an infection, get it checked right away. Make sure to tell your health care provider if you have gone into preterm labor before. This information is not intended to replace advice given to you by your health care provider. Make sure you discuss any questions you have with your health care provider. Document Revised: 09/03/2018 Document Reviewed: 10/03/2015 Elsevier Patient Education  2020 [employer].

## 2023-08-28 LAB — CERVICOVAGINAL ANCILLARY ONLY
Chlamydia: NEGATIVE
Comment: NEGATIVE
Comment: NORMAL
Neisseria Gonorrhea: NEGATIVE

## 2023-08-31 LAB — CULTURE, BETA STREP (GROUP B ONLY): Strep Gp B Culture: NEGATIVE

## 2023-09-03 ENCOUNTER — Ambulatory Visit (INDEPENDENT_AMBULATORY_CARE_PROVIDER_SITE_OTHER): Payer: Medicaid Other | Admitting: Women's Health

## 2023-09-03 ENCOUNTER — Encounter: Payer: Self-pay | Admitting: Women's Health

## 2023-09-03 VITALS — BP 101/69 | HR 96 | Wt 166.4 lb

## 2023-09-03 DIAGNOSIS — Z3483 Encounter for supervision of other normal pregnancy, third trimester: Secondary | ICD-10-CM | POA: Diagnosis not present

## 2023-09-03 DIAGNOSIS — Z3A37 37 weeks gestation of pregnancy: Secondary | ICD-10-CM

## 2023-09-03 DIAGNOSIS — Z3480 Encounter for supervision of other normal pregnancy, unspecified trimester: Secondary | ICD-10-CM

## 2023-09-03 NOTE — Progress Notes (Signed)
 LOW-RISK PREGNANCY VISIT Patient name: Gina Alvarez MRN [id number]  Date of birth: Nov 01, 1990 Chief Complaint:   Routine Prenatal Visit (Cervical check)  History of Present Illness:   Gina Alvarez is a 33 y.o. [phone] female at [redacted]w[redacted]d with an Estimated Date of Delivery: 09/24/23 being seen today for ongoing management of a low-risk pregnancy.   Today she reports occasional contractions. Contractions: Irregular.  .  Movement: Present. denies leaking of fluid.     07/02/2023    9:57 AM 05/05/2023   10:17 AM 03/17/2023   10:08 AM 12/10/2022    9:03 AM 11/12/2022    3:30 PM  Depression screen PHQ 2/9  Decreased Interest 1 2 2  0 0  Down, Depressed, Hopeless  1 2 0 0  PHQ - 2 Score 1 3 4  0 0  Altered sleeping  0 0 0 0  Tired, decreased energy 1 2 2  0 0  Change in appetite 0 0 0 2 0  Feeling bad or failure about yourself  1 1 1  0 0  Trouble concentrating  0 0 0 0  Moving slowly or fidgety/restless 0 0 0 0 0  Suicidal thoughts 0 0 0 0 0  PHQ-9 Score  6 7 2  0  Difficult doing work/chores     Not difficult at all        05/05/2023   10:19 AM 03/17/2023   10:08 AM 12/10/2022    9:03 AM 11/12/2022    3:33 PM  GAD 7 : Generalized Anxiety Score  Nervous, Anxious, on Edge 0 2 0 3  Control/stop worrying 2 1 0 0  Worry too much - different things 2 1 0 3  Trouble relaxing 0 1 0 0  Restless 0 0 0 3  Easily annoyed or irritable 3 3 0 0  Afraid - awful might happen 0 0 0 3  Total GAD 7 Score 7 8 0 12  Anxiety Difficulty    Very difficult      Review of Systems:   Pertinent items are noted in HPI Denies abnormal vaginal discharge w/ itching/odor/irritation, headaches, visual changes, shortness of breath, chest pain, abdominal pain, severe nausea/vomiting, or problems with urination or bowel movements unless otherwise stated above. Pertinent History Reviewed:  Reviewed past medical,surgical, social, obstetrical and family history.  Reviewed problem list, medications and  allergies. Physical Assessment:   Vitals:   09/03/23 0849  BP: 101/69  Pulse: 96  Weight: 166 lb 6.4 oz (75.5 kg)  Body mass index is 33.61 kg/m.        Physical Examination:   General appearance: Well appearing, and in no distress  Mental status: Alert, oriented to person, place, and time  Skin: Warm & dry  Cardiovascular: Normal heart rate noted  Respiratory: Normal respiratory effort, no distress  Abdomen: Soft, gravid, nontender  Pelvic: Cervical exam performed  Dilation: 3.5 Effacement (%): 50 Station: -3  Extremities: Edema: None  Fetal Status: Fetal Heart Rate (bpm): 148 Fundal Height: 36 cm Movement: Present Presentation: Vertex  Chaperone: Peggy Dones No results found for this or any previous visit (from the past 24 hours).  Assessment & Plan:  1) Low-risk pregnancy [id number] at [redacted]w[redacted]d with an Estimated Date of Delivery: 09/24/23   2) Hypothyroidism, no meds, last TSH 1.410   3) BRCA+ s/p bilateral ppx mastectomy/implants- pp RA TLH+BSO   4) Dep/anx> on meds/IBH   Meds: No orders of the defined types were placed in this encounter.  Labs/procedures today: SVE  Plan:  Continue routine obstetrical care  Next visit: prefers in person    Reviewed: Term labor symptoms and general obstetric precautions including but not limited to vaginal bleeding, contractions, leaking of fluid and fetal movement were reviewed in detail with the patient.  All questions were answered. Does have home bp cuff. Office bp cuff given: not applicable. Check bp weekly, let us know if consistently >140 and/or >90.  Follow-up: Return for As scheduled.  Future Appointments  Date Time Provider Department Center  09/10/2023  8:50 AM Cheral Marker, CNM [facility]  09/17/2023  8:50 AM Cheral Marker, CNM [facility]  09/24/2023  8:50 AM Cheral Marker, CNM [facility]  10/20/2023  9:45 AM Dillingham, Alena Bills, DO [facility] None    No orders of the defined types were placed in this  encounter.  Cheral Marker CNM, [facility] 09/03/2023 9:08 AM

## 2023-09-03 NOTE — Progress Notes (Signed)
10/1

## 2023-09-03 NOTE — Patient Instructions (Signed)
 Gina Alvarez, thank you for choosing our office today! We appreciate the opportunity to meet your healthcare needs. You may receive a short survey by mail, e-mail, or through [employer]. If you are happy with your care we would appreciate if you could take just a few minutes to complete the survey questions. We read all of your comments and take your feedback very seriously. Thank you again for choosing our office.  Center for [employer] Team at [facility]  [facility] ([address] [place], [place] [address]) Entrance C, located off of [address] Free 24/7 valet parking   CLASSES: Go to [email or web address] to register for classes (childbirth, breastfeeding, waterbirth, infant CPR, daddy bootcamp, etc.)  Call the office [phone]) or go to [facility] if: You begin to have strong, frequent contractions Your water breaks.  Sometimes it is a big gush of fluid, sometimes it is just a trickle that keeps getting your panties wet or running down your legs You have vaginal bleeding.  It is normal to have a small amount of spotting if your cervix was checked.  You don't feel your baby moving like normal.  If you don't, get you something to eat and drink and lay down and focus on feeling your baby move.   If your baby is still not moving like normal, you should call the office or go to [facility].  Call the office [phone]) or go to [facility] for these signs of pre-eclampsia: Severe headache that does not go away with Tylenol Visual changes- seeing spots, double, blurred vision Pain under your right breast or upper abdomen that does not go away with Tums or heartburn medicine Nausea and/or vomiting Severe swelling in your hands, feet, and face   [facility] Pediatricians/Family Doctors [facility]): [address] Colette Ribas, [phone]           [facility]: [address], [phone]                 [facility]): [address], [phone] (call to ask if accepting patients) [facility]: [address], [place], [phone]    [facility] Pediatricians/Family Doctors [facility]): [address], [phone] [facility]: [address] [place], [phone] [facility]: [address], [phone]  [facility] Doctors  [facility]): [phone] [facility]: [address], [phone]   [facility]: [address] [phone]  [facility] Doctors  [facility]: [phone], [phone]  Home Blood Pressure Monitoring for Patients   Your provider has recommended that you check your blood pressure (BP) at least once a week at home. If you do not have a blood pressure cuff at home, one will be provided for you. Contact your provider if you have not received your monitor within 1 week.   Helpful Tips for Accurate Home Blood Pressure Checks  Don't smoke, exercise, or drink caffeine 30 minutes before checking your BP Use the restroom before checking your BP (a full bladder can raise your pressure) Relax in a comfortable upright chair Feet on the ground Left arm resting comfortably on a flat surface at the level of your heart Legs uncrossed Back supported Sit quietly and don't talk Place the cuff on your bare arm Adjust snuggly, so that only two fingertips  can fit between your skin and the top of the cuff Check 2 readings separated by at least one minute Keep a log of your BP readings For a visual, please reference this diagram: http://ccnc.care/bpdiagram  Provider Name: [facility]     Phone: [phone]  Zone 1: ALL CLEAR  Continue to monitor your symptoms:  BP reading is less than 140 (top number) or less than 90 (bottom number)  No right  upper stomach pain No headaches or seeing spots No feeling nauseated or throwing up No swelling in face and hands  Zone 2: CAUTION Call your doctor's office for any of the following:  BP reading is greater than 140 (top number) or greater than 90 (bottom number)  Stomach pain under your ribs in the middle or right side Headaches or seeing spots Feeling nauseated or throwing up Swelling in face and hands  Zone 3: EMERGENCY  Seek immediate medical care if you have any of the following:  BP reading is greater than160 (top number) or greater than 110 (bottom number) Severe headaches not improving with Tylenol Serious difficulty catching your breath Any worsening symptoms from Zone 2   Braxton Hicks Contractions Contractions of the uterus can occur throughout pregnancy, but they are not always a sign that you are in labor. You may have practice contractions called Braxton Hicks contractions. These false labor contractions are sometimes confused with true labor. What are Deberah Pelton contractions? Braxton Hicks contractions are tightening movements that occur in the muscles of the uterus before labor. Unlike true labor contractions, these contractions do not result in opening (dilation) and thinning of the cervix. Toward the end of pregnancy (32-34 weeks), Braxton Hicks contractions can happen more often and may become stronger. These contractions are sometimes difficult to tell apart from true labor because they can be very uncomfortable. You should not feel embarrassed if you go to the hospital with false labor. Sometimes, the only way to tell if you are in true labor is for your health care provider to look for changes in the cervix. The health care provider will do a physical exam and may monitor your contractions. If you are not in true labor, the exam should show that your cervix is not dilating and your water has not broken. If there are no other health problems associated with your  pregnancy, it is completely safe for you to be sent home with false labor. You may continue to have Braxton Hicks contractions until you go into true labor. How to tell the difference between true labor and false labor True labor Contractions last 30-70 seconds. Contractions become very regular. Discomfort is usually felt in the top of the uterus, and it spreads to the lower abdomen and low back. Contractions do not go away with walking. Contractions usually become more intense and increase in frequency. The cervix dilates and gets thinner. False labor Contractions are usually shorter and not as strong as true labor contractions. Contractions are usually irregular. Contractions are often felt in the front of the lower abdomen and in the groin. Contractions may go away when you walk around or change positions while lying down. Contractions get weaker and are shorter-lasting as time goes on. The cervix usually does not dilate or become thin. Follow these instructions at home:  Take over-the-counter and prescription medicines only as told by your health care provider. Keep up with your usual exercises and follow other instructions from your health care provider. Eat and drink lightly if you think  you are going into labor. If Braxton Hicks contractions are making you uncomfortable: Change your position from lying down or resting to walking, or change from walking to resting. Sit and rest in a tub of warm water. Drink enough fluid to keep your urine pale yellow. Dehydration may cause these contractions. Do slow and deep breathing several times an hour. Keep all follow-up prenatal visits as told by your health care provider. This is important. Contact a health care provider if: You have a fever. You have continuous pain in your abdomen. Get help right away if: Your contractions become stronger, more regular, and closer together. You have fluid leaking or gushing from your vagina. You pass  blood-tinged mucus (bloody show). You have bleeding from your vagina. You have low back pain that you never had before. You feel your baby's head pushing down and causing pelvic pressure. Your baby is not moving inside you as much as it used to. Summary Contractions that occur before labor are called Braxton Hicks contractions, false labor, or practice contractions. Braxton Hicks contractions are usually shorter, weaker, farther apart, and less regular than true labor contractions. True labor contractions usually become progressively stronger and regular, and they become more frequent. Manage discomfort from [facility] contractions by changing position, resting in a warm bath, drinking plenty of water, or practicing deep breathing. This information is not intended to replace advice given to you by your health care provider. Make sure you discuss any questions you have with your health care provider. Document Revised: 04/24/2017 Document Reviewed: 09/25/2016 Elsevier Patient Education  2020 [employer].

## 2023-09-10 ENCOUNTER — Encounter: Payer: Self-pay | Admitting: Women's Health

## 2023-09-10 ENCOUNTER — Ambulatory Visit: Payer: Medicaid Other | Admitting: Women's Health

## 2023-09-10 VITALS — BP 103/72 | HR 89 | Wt 167.5 lb

## 2023-09-10 DIAGNOSIS — Z3A38 38 weeks gestation of pregnancy: Secondary | ICD-10-CM | POA: Diagnosis not present

## 2023-09-10 DIAGNOSIS — Z3480 Encounter for supervision of other normal pregnancy, unspecified trimester: Secondary | ICD-10-CM

## 2023-09-10 DIAGNOSIS — Z3483 Encounter for supervision of other normal pregnancy, third trimester: Secondary | ICD-10-CM

## 2023-09-10 MED ORDER — FAMOTIDINE 20 MG PO TABS: 20.0000 mg | ORAL_TABLET | Freq: Two times a day (BID) | ORAL | 0 refills | Status: DC

## 2023-09-10 NOTE — Progress Notes (Signed)
 LOW-RISK PREGNANCY VISIT Patient name: Gina Alvarez MRN [id number]  Date of birth: [birth year] Chief Complaint:   Routine Prenatal Visit (Acid reflux is bad )  History of Present Illness:   Gina Alvarez is a 33 y.o. [phone] female at [redacted]w[redacted]d with an Estimated Date of Delivery: 09/24/23 being seen today for ongoing management of a low-risk pregnancy.   Today she reports heartburn/reflux, on protonix 40mg , wants to try something else. Contractions: Irritability. Vag. Bleeding: None.  Movement: Present. denies leaking of fluid.     07/02/2023    9:57 AM 05/05/2023   10:17 AM 03/17/2023   10:08 AM 12/10/2022    9:03 AM 11/12/2022    3:30 PM  Depression screen PHQ 2/9  Decreased Interest 1 2 2  0 0  Down, Depressed, Hopeless  1 2 0 0  PHQ - 2 Score 1 3 4  0 0  Altered sleeping  0 0 0 0  Tired, decreased energy 1 2 2  0 0  Change in appetite 0 0 0 2 0  Feeling bad or failure about yourself  1 1 1  0 0  Trouble concentrating  0 0 0 0  Moving slowly or fidgety/restless 0 0 0 0 0  Suicidal thoughts 0 0 0 0 0  PHQ-9 Score  6 7 2  0  Difficult doing work/chores     Not difficult at all        05/05/2023   10:19 AM 03/17/2023   10:08 AM 12/10/2022    9:03 AM 11/12/2022    3:33 PM  GAD 7 : Generalized Anxiety Score  Nervous, Anxious, on Edge 0 2 0 3  Control/stop worrying 2 1 0 0  Worry too much - different things 2 1 0 3  Trouble relaxing 0 1 0 0  Restless 0 0 0 3  Easily annoyed or irritable 3 3 0 0  Afraid - awful might happen 0 0 0 3  Total GAD 7 Score 7 8 0 12  Anxiety Difficulty    Very difficult      Review of Systems:   Pertinent items are noted in HPI Denies abnormal vaginal discharge w/ itching/odor/irritation, headaches, visual changes, shortness of breath, chest pain, abdominal pain, severe nausea/vomiting, or problems with urination or bowel movements unless otherwise stated above. Pertinent History Reviewed:  Reviewed past medical,surgical, social, obstetrical and  family history.  Reviewed problem list, medications and allergies. Physical Assessment:   Vitals:   09/10/23 0907  BP: 103/72  Pulse: 89  Weight: 167 lb 8 oz (76 kg)  Body mass index is 33.83 kg/m.        Physical Examination:   General appearance: Well appearing, and in no distress  Mental status: Alert, oriented to person, place, and time  Skin: Warm & dry  Cardiovascular: Normal heart rate noted  Respiratory: Normal respiratory effort, no distress  Abdomen: Soft, gravid, nontender  Pelvic: Cervical exam performed  Dilation: 3.5 Effacement (%): 50 Station: Ballotable  Extremities: Edema: None  Fetal Status: Fetal Heart Rate (bpm): 142 Fundal Height: 37 cm Movement: Present Presentation: Vertex  Chaperone: Malachy Mood No results found for this or any previous visit (from the past 24 hours).  Assessment & Plan:  1) Low-risk pregnancy [id number] at [redacted]w[redacted]d with an Estimated Date of Delivery: 09/24/23   2) Hypothyroidism, no meds, last TSH 1.410   3) BRCA+ s/p bilateral ppx mastectomy/implants- pp RA TLH+BSO   4) Dep/anx> on meds/IBH  5) Reflux> stop protonix, rx prilosec  Meds: No orders of the defined types were placed in this encounter.  Labs/procedures today: SVE  Plan:  Continue routine obstetrical care  Next visit: prefers in person    Reviewed: Term labor symptoms and general obstetric precautions including but not limited to vaginal bleeding, contractions, leaking of fluid and fetal movement were reviewed in detail with the patient.  All questions were answered. Does have home bp cuff. Office bp cuff given: not applicable. Check bp weekly, let us  know if consistently >140 and/or >90.  Follow-up: Return for As scheduled.  Future Appointments  Date Time Provider Department Center  09/17/2023  8:50 AM Ferd Householder, CNM [facility]  09/24/2023  8:50 AM Ferd Householder, CNM [facility]  10/20/2023  9:45 AM Dillingham, Lindaann Requena, DO [facility] None    No orders  of the defined types were placed in this encounter.  Ferd Householder CNM, [facility] 09/10/2023 9:35 AM

## 2023-09-10 NOTE — Patient Instructions (Signed)
 Baxter Hire, thank you for choosing our office today! We appreciate the opportunity to meet your healthcare needs. You may receive a short survey by mail, e-mail, or through [employer]. If you are happy with your care we would appreciate if you could take just a few minutes to complete the survey questions. We read all of your comments and take your feedback very seriously. Thank you again for choosing our office.  Center for [employer] Team at [facility]  [facility] ([address] [place], [place] [address]) Entrance C, located off of [address] Free 24/7 valet parking   CLASSES: Go to [email or web address] to register for classes (childbirth, breastfeeding, waterbirth, infant CPR, daddy bootcamp, etc.)  Call the office [phone]) or go to [facility] if: You begin to have strong, frequent contractions Your water breaks.  Sometimes it is a big gush of fluid, sometimes it is just a trickle that keeps getting your panties wet or running down your legs You have vaginal bleeding.  It is normal to have a small amount of spotting if your cervix was checked.  You don't feel your baby moving like normal.  If you don't, get you something to eat and drink and lay down and focus on feeling your baby move.   If your baby is still not moving like normal, you should call the office or go to [facility].  Call the office [phone]) or go to [facility] for these signs of pre-eclampsia: Severe headache that does not go away with Tylenol Visual changes- seeing spots, double, blurred vision Pain under your right breast or upper abdomen that does not go away with Tums or heartburn medicine Nausea and/or vomiting Severe swelling in your hands, feet, and face   [facility] Pediatricians/Family Doctors [facility]): [address] Colette Ribas, [phone]           [facility]: [address], [phone]                 [facility]): [address], [phone] (call to ask if accepting patients) [facility]: [address], [place], [phone]    [facility] Pediatricians/Family Doctors [facility]): [address], [phone] [facility]: [address] [place], [phone] [facility]: [address], [phone]  [facility] Doctors  [facility]): [phone] [facility]: [address], [phone]   [facility]: [address] [phone]  [facility] Doctors  [facility]: [phone], [phone]  Home Blood Pressure Monitoring for Patients   Your provider has recommended that you check your blood pressure (BP) at least once a week at home. If you do not have a blood pressure cuff at home, one will be provided for you. Contact your provider if you have not received your monitor within 1 week.   Helpful Tips for Accurate Home Blood Pressure Checks  Don't smoke, exercise, or drink caffeine 30 minutes before checking your BP Use the restroom before checking your BP (a full bladder can raise your pressure) Relax in a comfortable upright chair Feet on the ground Left arm resting comfortably on a flat surface at the level of your heart Legs uncrossed Back supported Sit quietly and don't talk Place the cuff on your bare arm Adjust snuggly, so that only two fingertips  can fit between your skin and the top of the cuff Check 2 readings separated by at least one minute Keep a log of your BP readings For a visual, please reference this diagram: http://ccnc.care/bpdiagram  Provider Name: [facility]     Phone: [phone]  Zone 1: ALL CLEAR  Continue to monitor your symptoms:  BP reading is less than 140 (top number) or less than 90 (bottom number)  No right  upper stomach pain No headaches or seeing spots No feeling nauseated or throwing up No swelling in face and hands  Zone 2: CAUTION Call your doctor's office for any of the following:  BP reading is greater than 140 (top number) or greater than 90 (bottom number)  Stomach pain under your ribs in the middle or right side Headaches or seeing spots Feeling nauseated or throwing up Swelling in face and hands  Zone 3: EMERGENCY  Seek immediate medical care if you have any of the following:  BP reading is greater than160 (top number) or greater than 110 (bottom number) Severe headaches not improving with Tylenol Serious difficulty catching your breath Any worsening symptoms from Zone 2   Braxton Hicks Contractions Contractions of the uterus can occur throughout pregnancy, but they are not always a sign that you are in labor. You may have practice contractions called Braxton Hicks contractions. These false labor contractions are sometimes confused with true labor. What are Deberah Pelton contractions? Braxton Hicks contractions are tightening movements that occur in the muscles of the uterus before labor. Unlike true labor contractions, these contractions do not result in opening (dilation) and thinning of the cervix. Toward the end of pregnancy (32-34 weeks), Braxton Hicks contractions can happen more often and may become stronger. These contractions are sometimes difficult to tell apart from true labor because they can be very uncomfortable. You should not feel embarrassed if you go to the hospital with false labor. Sometimes, the only way to tell if you are in true labor is for your health care provider to look for changes in the cervix. The health care provider will do a physical exam and may monitor your contractions. If you are not in true labor, the exam should show that your cervix is not dilating and your water has not broken. If there are no other health problems associated with your  pregnancy, it is completely safe for you to be sent home with false labor. You may continue to have Braxton Hicks contractions until you go into true labor. How to tell the difference between true labor and false labor True labor Contractions last 30-70 seconds. Contractions become very regular. Discomfort is usually felt in the top of the uterus, and it spreads to the lower abdomen and low back. Contractions do not go away with walking. Contractions usually become more intense and increase in frequency. The cervix dilates and gets thinner. False labor Contractions are usually shorter and not as strong as true labor contractions. Contractions are usually irregular. Contractions are often felt in the front of the lower abdomen and in the groin. Contractions may go away when you walk around or change positions while lying down. Contractions get weaker and are shorter-lasting as time goes on. The cervix usually does not dilate or become thin. Follow these instructions at home:  Take over-the-counter and prescription medicines only as told by your health care provider. Keep up with your usual exercises and follow other instructions from your health care provider. Eat and drink lightly if you think  you are going into labor. If Braxton Hicks contractions are making you uncomfortable: Change your position from lying down or resting to walking, or change from walking to resting. Sit and rest in a tub of warm water. Drink enough fluid to keep your urine pale yellow. Dehydration may cause these contractions. Do slow and deep breathing several times an hour. Keep all follow-up prenatal visits as told by your health care provider. This is important. Contact a health care provider if: You have a fever. You have continuous pain in your abdomen. Get help right away if: Your contractions become stronger, more regular, and closer together. You have fluid leaking or gushing from your vagina. You pass  blood-tinged mucus (bloody show). You have bleeding from your vagina. You have low back pain that you never had before. You feel your baby's head pushing down and causing pelvic pressure. Your baby is not moving inside you as much as it used to. Summary Contractions that occur before labor are called Braxton Hicks contractions, false labor, or practice contractions. Braxton Hicks contractions are usually shorter, weaker, farther apart, and less regular than true labor contractions. True labor contractions usually become progressively stronger and regular, and they become more frequent. Manage discomfort from [facility] contractions by changing position, resting in a warm bath, drinking plenty of water, or practicing deep breathing. This information is not intended to replace advice given to you by your health care provider. Make sure you discuss any questions you have with your health care provider. Document Revised: 04/24/2017 Document Reviewed: 09/25/2016 Elsevier Patient Education  2020 [employer].

## 2023-09-15 ENCOUNTER — Encounter: Payer: Self-pay | Admitting: Family Medicine

## 2023-09-15 ENCOUNTER — Other Ambulatory Visit: Payer: Self-pay | Admitting: Family Medicine

## 2023-09-15 DIAGNOSIS — Z111 Encounter for screening for respiratory tuberculosis: Secondary | ICD-10-CM

## 2023-09-16 ENCOUNTER — Other Ambulatory Visit: Payer: Self-pay

## 2023-09-16 ENCOUNTER — Encounter (HOSPITAL_COMMUNITY): Payer: Self-pay | Admitting: Obstetrics and Gynecology

## 2023-09-16 ENCOUNTER — Inpatient Hospital Stay (HOSPITAL_COMMUNITY): Admitting: Anesthesiology

## 2023-09-16 ENCOUNTER — Inpatient Hospital Stay (HOSPITAL_COMMUNITY)
Admission: AD | Admit: 2023-09-16 | Discharge: 2023-09-17 | DRG: 807 | Disposition: A | Discharge status: Home / Self Care | Attending: Family Medicine | Admitting: Family Medicine

## 2023-09-16 DIAGNOSIS — O99344 Other mental disorders complicating childbirth: Secondary | ICD-10-CM | POA: Diagnosis not present

## 2023-09-16 DIAGNOSIS — Z87891 Personal history of nicotine dependence: Secondary | ICD-10-CM

## 2023-09-16 DIAGNOSIS — O99284 Endocrine, nutritional and metabolic diseases complicating childbirth: Secondary | ICD-10-CM | POA: Diagnosis not present

## 2023-09-16 DIAGNOSIS — E039 Hypothyroidism, unspecified: Secondary | ICD-10-CM | POA: Diagnosis not present

## 2023-09-16 DIAGNOSIS — F419 Anxiety disorder, unspecified: Secondary | ICD-10-CM | POA: Diagnosis present

## 2023-09-16 DIAGNOSIS — Z3A38 38 weeks gestation of pregnancy: Secondary | ICD-10-CM

## 2023-09-16 DIAGNOSIS — O26893 Other specified pregnancy related conditions, third trimester: Secondary | ICD-10-CM | POA: Diagnosis not present

## 2023-09-16 DIAGNOSIS — F32A Depression, unspecified: Secondary | ICD-10-CM | POA: Diagnosis present

## 2023-09-16 DIAGNOSIS — Z79890 Hormone replacement therapy (postmenopausal): Secondary | ICD-10-CM | POA: Diagnosis not present

## 2023-09-16 DIAGNOSIS — Z79899 Other long term (current) drug therapy: Secondary | ICD-10-CM

## 2023-09-16 LAB — COMPREHENSIVE METABOLIC PANEL WITH GFR
ALT: 8 U/L (ref 0–44)
AST: 17 U/L (ref 15–41)
Albumin: 2.7 g/dL — ABNORMAL LOW (ref 3.5–5.0)
Alkaline Phosphatase: 125 U/L (ref 38–126)
Anion gap: 11 (ref 5–15)
BUN: 7 mg/dL (ref 6–20)
CO2: 18 mmol/L — ABNORMAL LOW (ref 22–32)
Calcium: 8.9 mg/dL (ref 8.9–10.3)
Chloride: 105 mmol/L (ref 98–111)
Creatinine, Ser: 0.56 mg/dL (ref 0.44–1.00)
GFR, Estimated: >60 mL/min (ref >60)
Glucose, Bld: 74 mg/dL (ref 70–99)
Potassium: 3.8 mmol/L (ref 3.5–5.1)
Sodium: 134 mmol/L — ABNORMAL LOW (ref 135–145)
Total Bilirubin: 0.4 mg/dL (ref 0.0–1.2)
Total Protein: 6.4 g/dL — ABNORMAL LOW (ref 6.5–8.1)

## 2023-09-16 LAB — CBC
HCT: 33.6 % — ABNORMAL LOW (ref 36.0–46.0)
Hemoglobin: 11.9 g/dL — ABNORMAL LOW (ref 12.0–15.0)
MCH: 32.1 pg (ref 26.0–34.0)
MCHC: 35.4 g/dL (ref 30.0–36.0)
MCV: 90.6 fL (ref 80.0–100.0)
Platelets: 221 10*3/uL (ref 150–400)
RBC: 3.71 MIL/uL — ABNORMAL LOW (ref 3.87–5.11)
RDW: 12.7 % (ref 11.5–15.5)
WBC: 10.9 10*3/uL — ABNORMAL HIGH (ref 4.0–10.5)
nRBC: 0 % (ref 0.0–0.2)

## 2023-09-16 LAB — RPR: RPR Ser Ql: NONREACTIVE

## 2023-09-16 LAB — TYPE AND SCREEN
ABO/RH(D): A POS
Antibody Screen: NEGATIVE

## 2023-09-16 MED ORDER — LIDOCAINE HCL (PF) 1 % IJ SOLN
INTRAMUSCULAR | Status: DC | PRN
  Administered 2023-09-16: 10 mL via EPIDURAL

## 2023-09-16 MED ORDER — OXYCODONE-ACETAMINOPHEN 5-325 MG PO TABS: 1.0000 | ORAL_TABLET | ORAL | Status: DC | PRN

## 2023-09-16 MED ORDER — PHENYLEPHRINE 80 MCG/ML (10ML) SYRINGE FOR IV PUSH (FOR BLOOD PRESSURE SUPPORT): 80.0000 ug | PREFILLED_SYRINGE | INTRAVENOUS | Status: DC | PRN

## 2023-09-16 MED ORDER — ONDANSETRON HCL 4 MG/2ML IJ SOLN: 4.0000 mg | Freq: Four times a day (QID) | INTRAMUSCULAR | Status: DC | PRN

## 2023-09-16 MED ORDER — DIPHENHYDRAMINE HCL 25 MG PO CAPS: 25.0000 mg | ORAL_CAPSULE | Freq: Four times a day (QID) | ORAL | Status: DC | PRN

## 2023-09-16 MED ORDER — FENTANYL-BUPIVACAINE-NACL 0.5-0.125-0.9 MG/250ML-% EP SOLN
12.0000 mL/h | EPIDURAL | Status: DC | PRN
  Administered 2023-09-16: 12 mL/h via EPIDURAL
  Filled 2023-09-16: qty 250

## 2023-09-16 MED ORDER — ONDANSETRON HCL 4 MG PO TABS: 4.0000 mg | ORAL_TABLET | ORAL | Status: DC | PRN

## 2023-09-16 MED ORDER — BENZOCAINE-MENTHOL 20-0.5 % EX AERO: 1.0000 | INHALATION_SPRAY | CUTANEOUS | Status: DC | PRN

## 2023-09-16 MED ORDER — WITCH HAZEL-GLYCERIN EX PADS: 1.0000 | MEDICATED_PAD | CUTANEOUS | Status: DC | PRN

## 2023-09-16 MED ORDER — LEVOTHYROXINE SODIUM 50 MCG PO TABS: 50.0000 ug | ORAL_TABLET | Freq: Every day | ORAL | Status: DC

## 2023-09-16 MED ORDER — LACTATED RINGERS IV SOLN: 500.0000 mL | Freq: Once | INTRAVENOUS | Status: DC

## 2023-09-16 MED ORDER — CETIRIZINE HCL 10 MG PO TABS
10.0000 mg | ORAL_TABLET | Freq: Every day | ORAL | Status: DC
  Administered 2023-09-16: 10 mg via ORAL
  Filled 2023-09-16: qty 1

## 2023-09-16 MED ORDER — LIDOCAINE HCL (PF) 1 % IJ SOLN: 30.0000 mL | INTRAMUSCULAR | Status: DC | PRN

## 2023-09-16 MED ORDER — SERTRALINE HCL 100 MG PO TABS
100.0000 mg | ORAL_TABLET | Freq: Every day | ORAL | Status: DC
  Administered 2023-09-16: 100 mg via ORAL
  Filled 2023-09-16: qty 1

## 2023-09-16 MED ORDER — OXYTOCIN BOLUS FROM INFUSION
333.0000 mL | Freq: Once | INTRAVENOUS | Status: AC
  Administered 2023-09-16: 333 mL via INTRAVENOUS

## 2023-09-16 MED ORDER — ONDANSETRON HCL 4 MG/2ML IJ SOLN: 4.0000 mg | INTRAMUSCULAR | Status: DC | PRN

## 2023-09-16 MED ORDER — DIBUCAINE (PERIANAL) 1 % EX OINT: 1.0000 | TOPICAL_OINTMENT | CUTANEOUS | Status: DC | PRN

## 2023-09-16 MED ORDER — LACTATED RINGERS IV SOLN: 500.0000 mL | INTRAVENOUS | Status: DC | PRN

## 2023-09-16 MED ORDER — SERTRALINE HCL 100 MG PO TABS
100.0000 mg | ORAL_TABLET | Freq: Every day | ORAL | Status: DC
  Administered 2023-09-17: 100 mg via ORAL
  Filled 2023-09-16: qty 1

## 2023-09-16 MED ORDER — SENNOSIDES-DOCUSATE SODIUM 8.6-50 MG PO TABS
2.0000 | ORAL_TABLET | Freq: Every day | ORAL | Status: AC
  Administered 2023-09-17: 2 via ORAL
  Filled 2023-09-16: qty 2

## 2023-09-16 MED ORDER — SIMETHICONE 80 MG PO CHEW: 80.0000 mg | CHEWABLE_TABLET | ORAL | Status: DC | PRN

## 2023-09-16 MED ORDER — FENTANYL CITRATE (PF) 100 MCG/2ML IJ SOLN
50.0000 ug | INTRAMUSCULAR | Status: DC | PRN
  Administered 2023-09-16: 100 ug via INTRAVENOUS
  Filled 2023-09-16: qty 2

## 2023-09-16 MED ORDER — LACTATED RINGERS IV SOLN: INTRAVENOUS | Status: DC

## 2023-09-16 MED ORDER — EPHEDRINE 5 MG/ML INJ: 10.0000 mg | INTRAVENOUS | Status: DC | PRN

## 2023-09-16 MED ORDER — COCONUT OIL OIL: 1.0000 | TOPICAL_OIL | Status: DC | PRN

## 2023-09-16 MED ORDER — OXYCODONE HCL 5 MG PO TABS
5.0000 mg | ORAL_TABLET | Freq: Four times a day (QID) | ORAL | Status: DC | PRN
  Administered 2023-09-17: 5 mg via ORAL
  Filled 2023-09-16: qty 1

## 2023-09-16 MED ORDER — TETANUS-DIPHTH-ACELL PERTUSSIS 5-2.5-18.5 LF-MCG/0.5 IM SUSY: 0.5000 mL | PREFILLED_SYRINGE | Freq: Once | INTRAMUSCULAR | Status: DC

## 2023-09-16 MED ORDER — ACETAMINOPHEN 325 MG PO TABS
650.0000 mg | ORAL_TABLET | ORAL | Status: DC | PRN
  Administered 2023-09-16: 650 mg via ORAL
  Filled 2023-09-16: qty 2

## 2023-09-16 MED ORDER — DIPHENHYDRAMINE HCL 50 MG/ML IJ SOLN: 12.5000 mg | INTRAMUSCULAR | Status: DC | PRN

## 2023-09-16 MED ORDER — OXYCODONE-ACETAMINOPHEN 5-325 MG PO TABS: 2.0000 | ORAL_TABLET | ORAL | Status: DC | PRN

## 2023-09-16 MED ORDER — SOD CITRATE-CITRIC ACID 500-334 MG/5ML PO SOLN: 30.0000 mL | ORAL | Status: DC | PRN

## 2023-09-16 MED ORDER — ACETAMINOPHEN 325 MG PO TABS
650.0000 mg | ORAL_TABLET | ORAL | Status: DC | PRN
  Administered 2023-09-17 (×2): 650 mg via ORAL
  Filled 2023-09-16 (×2): qty 2

## 2023-09-16 MED ORDER — FENTANYL CITRATE (PF) 100 MCG/2ML IJ SOLN
50.0000 ug | INTRAMUSCULAR | Status: DC | PRN
Start: 2023-09-16 — End: 2023-09-16

## 2023-09-16 MED ORDER — OXYTOCIN-SODIUM CHLORIDE 30-0.9 UT/500ML-% IV SOLN: 2.5000 [IU]/h | INTRAVENOUS | Status: DC | PRN

## 2023-09-16 MED ORDER — OXYTOCIN-SODIUM CHLORIDE 30-0.9 UT/500ML-% IV SOLN
2.5000 [IU]/h | INTRAVENOUS | Status: DC
  Filled 2023-09-16: qty 500

## 2023-09-16 MED ORDER — PRENATAL MULTIVITAMIN CH
1.0000 | ORAL_TABLET | Freq: Every day | ORAL | Status: DC
  Administered 2023-09-17: 1 via ORAL
  Filled 2023-09-16: qty 1

## 2023-09-16 MED ORDER — IBUPROFEN 600 MG PO TABS
600.0000 mg | ORAL_TABLET | Freq: Four times a day (QID) | ORAL | Status: DC
  Administered 2023-09-16 – 2023-09-17 (×3): 600 mg via ORAL
  Filled 2023-09-16 (×3): qty 1

## 2023-09-16 NOTE — Anesthesia Preprocedure Evaluation (Signed)
 Anesthesia Evaluation  Patient identified by MRN, date of birth, ID band Patient awake    Reviewed: Allergy & Precautions, NPO status , Patient's Chart, lab work & pertinent test results  Airway Mallampati: II  TM Distance: >3 FB Neck ROM: Full    Dental no notable dental hx.    Pulmonary neg pulmonary ROS, former smoker   Pulmonary exam normal breath sounds clear to auscultation       Cardiovascular negative cardio ROS Normal cardiovascular exam Rhythm:Regular Rate:Normal     Neuro/Psych  PSYCHIATRIC DISORDERS Anxiety Depression    negative neurological ROS     GI/Hepatic negative GI ROS, Neg liver ROS,,,  Endo/Other  Hypothyroidism    Renal/GU negative Renal ROS  negative genitourinary   Musculoskeletal negative musculoskeletal ROS (+)    Abdominal   Peds  Hematology negative hematology ROS (+)   Anesthesia Other Findings Presents in labor  Reproductive/Obstetrics (+) Pregnancy                             Anesthesia Physical Anesthesia Plan  ASA: 2  Anesthesia Plan: Epidural   Post-op Pain Management:    Induction:   PONV Risk Score and Plan: Treatment may vary due to age or medical condition  Airway Management Planned: Natural Airway  Additional Equipment:   Intra-op Plan:   Post-operative Plan:   Informed Consent: I have reviewed the patients History and Physical, chart, labs and discussed the procedure including the risks, benefits and alternatives for the proposed anesthesia with the patient or authorized representative who has indicated his/her understanding and acceptance.       Plan Discussed with: Anesthesiologist  Anesthesia Plan Comments: (Patient identified. Risks, benefits, options discussed with patient including but not limited to bleeding, infection, nerve damage, paralysis, failed block, incomplete pain control, headache, blood pressure changes,  nausea, vomiting, reactions to medication, itching, and post partum back pain. Confirmed with bedside nurse the patient's most recent platelet count. Confirmed with the patient that they are not taking any anticoagulation, have any bleeding history or any family history of bleeding disorders. Patient expressed understanding and wishes to proceed. All questions were answered. )       Anesthesia Quick Evaluation

## 2023-09-16 NOTE — Discharge Summary (Signed)
 Postpartum Discharge Summary     Patient Name: Gina Alvarez DOB: 09-01-1990 MRN: [id number]  Date of admission: 09/16/2023 Delivery date:09/16/2023 Delivering provider: Raynell Caller Date of discharge: 09/17/2023 OB Clinic: [facility]  Admitting diagnosis: Pregnancy at 38/6. Labor   Additional problems: Dep/anxiety  (on meds). Hypothyroidism (no meds). BRCA+ with h/o bilateral mastectomies and reconstruction.     Discharge diagnosis: Term Pregnancy Delivered                                              Post partum procedures: n/a Augmentation: AROM Complications: moderate meconium  Hospital course: patient had an uncomplicated labor and delivery. Large volume fluid at delivery with already known moderate meconium. Two "skid [place]" at the right peri-urethral area and perineum; both hemostatic and not repaired.  Patient had a postpartum course that was uncomplicated.  She is ambulating, tolerating a regular diet, passing flatus, and urinating well. Patient is discharged home in stable condition on 09/17/23.  Newborn Data: Birth date:09/16/2023 Birth time:5:55 PM Gender:Female Living status:Living Apgars:8 ,9  Weight:3510 g  Magnesium Sulfate received: No BMZ received: No Rhophylac:N/A Transfusion:No  Immunizations administered: Immunization History  Administered Date(s) Administered   DTP 01/17/1991   DTaP 01/17/1991   HIB (PRP-OMP) 01/17/1991   Hepatitis B 03/21/1991, 06/06/1991   IPV 01/17/1991   Influenza, High Dose Seasonal PF 04/04/2022   Influenza, Seasonal, Injecte, Preservative Fre 04/14/2023   Influenza,inj,Quad PF,6+ Mos 03/11/2017, 03/15/2019, 03/22/2021   Influenza-Unspecified 03/13/2018   OPV 01/17/1991   Td 01/23/2011   Td (Adult), 2 Lf Tetanus Toxid, Preservative Free 01/23/2011   Tdap 01/23/2011, 08/25/2015, 05/17/2017, 07/02/2023    Physical exam  Vitals:   09/16/23 2015 09/16/23 2137 09/17/23 0136 09/17/23 0547  BP: 105/78 113/75 102/69  108/72  Pulse: 70 77 90 68  Resp: 18 18 18 18   Temp: 98.2 F (36.8 C) 98.2 F (36.8 C) 98.1 F (36.7 C) 97.9 F (36.6 C)  TempSrc: Oral Oral Oral Oral  SpO2: 100% 99% 100% 100%  Weight:      Height:       General: alert, cooperative, and no distress Lochia: appropriate Uterine Fundus: firm Incision: N/A DVT Evaluation: No evidence of DVT seen on physical exam. Labs: Lab Results  Component Value Date   WBC 10.9 (H) 09/16/2023   HGB 11.9 (L) 09/16/2023   HCT 33.6 (L) 09/16/2023   MCV 90.6 09/16/2023   PLT 221 09/16/2023      Latest Ref Rng & Units 09/16/2023    9:41 AM  CMP  Glucose 70 - 99 mg/dL 74   BUN 6 - 20 mg/dL 7   Creatinine 5.78 - 4.69 mg/dL 6.29   Sodium 528 - 413 mmol/L 134   Potassium 3.5 - 5.1 mmol/L 3.8   Chloride 98 - 111 mmol/L 105   CO2 22 - 32 mmol/L 18   Calcium 8.9 - 10.3 mg/dL 8.9   Total Protein 6.5 - 8.1 g/dL 6.4   Total Bilirubin 0.0 - 1.2 mg/dL 0.4   Alkaline Phos 38 - 126 U/L 125   AST 15 - 41 U/L 17   ALT 0 - 44 U/L 8    Edinburgh Score:    06/18/2017   10:25 AM  Edinburgh Postnatal Depression Scale Screening Tool  I have been able to laugh and see the funny side of things.  0  I have looked forward with enjoyment to things. 0  I have blamed myself unnecessarily when things went wrong. 0  I have been anxious or worried for no good reason. 0  I have felt scared or panicky for no good reason. 0  Things have been getting on top of me. 0  I have been so unhappy that I have had difficulty sleeping. 0  I have felt sad or miserable. 0  I have been so unhappy that I have been crying. 0  The thought of harming myself has occurred to me. 0  Edinburgh Postnatal Depression Scale Total 0      After visit meds:  Allergies as of 09/17/2023   No Known Allergies      Medication List     TAKE these medications    acetaminophen  325 MG tablet Commonly known as: Tylenol  Take 2 tablets (650 mg total) by mouth every 4 (four) hours as needed  (for pain scale < 4).   cyclobenzaprine  10 MG tablet Commonly known as: FLEXERIL  Take 1 tablet (10 mg total) by mouth 3 (three) times daily as needed (headache).   famotidine  20 MG tablet Commonly known as: Pepcid  Take 1 tablet (20 mg total) by mouth 2 (two) times daily.   ibuprofen  600 MG tablet Commonly known as: ADVIL  Take 1 tablet (600 mg total) by mouth every 6 (six) hours.   levocetirizine 5 MG tablet Commonly known as: XYZAL  TAKE ONE TABLET BY MOUTH EVERY DAY   levothyroxine  50 MCG tablet Commonly known as: SYNTHROID  Take 1 tablet (50 mcg total) by mouth daily.   multivitamin-prenatal 27-0.8 MG Tabs tablet Take 1 tablet by mouth daily at 12 noon.   ondansetron  8 MG disintegrating tablet Commonly known as: ZOFRAN -ODT Take 1 tablet (8 mg total) by mouth every 8 (eight) hours as needed for nausea or vomiting.   senna-docusate 8.6-50 MG tablet Commonly known as: Senokot-S Take 2 tablets by mouth at bedtime.   sertraline  100 MG tablet Commonly known as: Zoloft  Take 1 tablet (100 mg total) by mouth daily.         Discharge home in stable condition Infant Feeding: Bottle Infant Disposition:home with mother Discharge instruction: per After Visit Summary and Postpartum booklet. Activity: Advance as tolerated. Pelvic rest for 6 weeks.  Diet: routine diet Anticipated Birth Control: Depo Postpartum Appointment:4 weeks Additional Postpartum F/U: Postpartum Depression checkup and Thyroid  Future Appointments: Future Appointments  Date Time Provider Department Center  10/20/2023  9:45 AM Dillingham, Lindaann Requena, DO [facility] None   Follow up Visit:  [X]  message sent 4/23 to FT for a 1 month PP visit with a GYN MD that does surgery    09/17/2023 Ebony Goldstein, MD

## 2023-09-16 NOTE — MAU Note (Signed)
 MAU Labor Triage Note: .Gina Alvarez is a 33 y.o. at [redacted]w[redacted]d here in MAU reporting:  Contractions every: 2 minutes Onset of ctx: last night Pain Score: 8  Pain Location: Back  ROM: Denies LOF Vaginal Bleeding: lost mucous plug with some light bleeding Last SVE: 4/17 3.5/50/ballotable Labor Pain Management Plan: Planning epidural  Fetal Movement: Reports positive FM FHT: Fetal Heart Rate Mode: Doppler Baseline Rate (A): 160 bpm Multiple birth?: No  Vitals:   09/16/23 0844  BP: 112/68  Pulse: (!) 105  Resp: 20  Temp: 97.6 F (36.4 C)  SpO2: 100%     OB Office: Faculty GBS: Negative Lab orders placed from triage: MAU Labor Eval

## 2023-09-16 NOTE — H&P (Signed)
 OBSTETRIC ADMISSION HISTORY AND PHYSICAL  Gina Alvarez is a 33 y.o. female [phone] with IUP at [redacted]w[redacted]d by LMP presenting for SOL. She reports +FMs, No LOF, no VB, no blurry vision, headaches or peripheral edema, and RUQ pain.  She plans on formula feeding. She request depo-provera  for birth control. She received her prenatal care at [facility]   Dating: By LMP --->  Estimated Date of Delivery: 09/24/23  Sono:   @[redacted]w[redacted]d , CWD, normal female anatomy, cephalic presentation, anterior placental lie, 310g, 46% EFW  Prenatal History/Complications:  - s/p bilateral mastectomy, BRCA positive - Hypothyroidism on Levothyroxine  - Depression with anxiety on Zoloft  - Hx postpartum urinary retention  Past Medical History: Past Medical History:  Diagnosis Date   Anxiety    Depression    Irregular menstrual bleeding 09/26/2013    Past Surgical History: Past Surgical History:  Procedure Laterality Date   BREAST RECONSTRUCTION WITH PLACEMENT OF TISSUE EXPANDER AND FLEX HD (ACELLULAR HYDRATED DERMIS) Bilateral 01/09/2022   Procedure: BILATERAL BREAST RECONSTRUCTION WITH PLACEMENT OF TISSUE EXPANDER AND FLEX HD (ACELLULAR HYDRATED DERMIS);  Surgeon: Thornell Flirt, DO;  Location: [facility];  Service: Plastics;  Laterality: Bilateral;   CHOLECYSTECTOMY N/A 09/14/2020   Procedure: LAPAROSCOPIC CHOLECYSTECTOMY;  Surgeon: Awilda Bogus, MD;  Location: [facility];  Service: General;  Laterality: N/A;   EXTRACORPOREAL SHOCK WAVE LITHOTRIPSY Right 06/07/2020   Procedure: EXTRACORPOREAL SHOCK WAVE LITHOTRIPSY (ESWL);  Surgeon: Adelbert Homans, MD;  Location: [facility];  Service: Urology;  Laterality: Right;   I & D EXTREMITY Right 02/14/2020   Procedure: IRRIGATION AND DEBRIDEMENT RIGHT RING FINGER WITH NAILBED REPAIR;  Surgeon: Brunilda Capra, MD;  Location: [facility];  Service: Orthopedics;  Laterality: Right;   NO PAST SURGERIES     PERCUTANEOUS PINNING Right 02/14/2020    Procedure: PERCUTANEOUS PINNING EXTREMITY;  Surgeon: Brunilda Capra, MD;  Location: [facility];  Service: Orthopedics;  Laterality: Right;   REMOVAL OF TISSUE EXPANDER AND PLACEMENT OF IMPLANT Bilateral 05/28/2022   Procedure: REMOVAL OF TISSUE EXPANDER AND PLACEMENT OF IMPLANT;  Surgeon: Thornell Flirt, DO;  Location: [facility];  Service: Plastics;  Laterality: Bilateral;   SIMPLE MASTECTOMY WITH AXILLARY SENTINEL NODE BIOPSY Bilateral 01/09/2022   Procedure: BILATERAL TOTAL MASTECTOMY;  Surgeon: Oza Blumenthal, MD;  Location: [facility];  Service: General;  Laterality: Bilateral;  LMA    Obstetrical History: OB History     Gravida  4   Para  2   Term  2   Preterm  0   AB  1   Living  2      SAB  1   IAB  0   Ectopic  0   Multiple  0   Live Births  2           Social History Social History   Socioeconomic History   Marital status: Single    Spouse name: Not on file   Number of children: Not on file   Years of education: Not on file   Highest education level: Some college, no degree  Occupational History   Not on file  Tobacco Use   Smoking status: Former    Current packs/day: 0.00    Average packs/day: 0.5 packs/day for 8.0 years (4.0 ttl pk-yrs)    Types: Cigarettes    Start date: 12/26/2009    Quit date: 12/26/2017    Years since quitting: 5.7   Smokeless tobacco: Never  Vaping Use   Vaping status: Never Used  Substance and Sexual Activity   Alcohol use: Never    Comment: maybe 1 drink per month   Drug use: Never   Sexual activity: Yes    Partners: Male    Birth control/protection: None  Other Topics Concern   Not on file  Social History Narrative   Not on file   Social Drivers of Health   Financial Resource Strain: Low Risk  (03/17/2023)   Overall Financial Resource Strain (CARDIA)    Difficulty of Paying Living Expenses: Not very hard  Food Insecurity: No Food Insecurity (08/24/2023)   Hunger Vital Sign     Worried About Running Out of Food in the Last Year: Never true    Ran Out of Food in the Last Year: Never true  Transportation Needs: No Transportation Needs (08/24/2023)   PRAPARE - Administrator, Civil Service (Medical): No    Lack of Transportation (Non-Medical): No  Physical Activity: Inactive (03/17/2023)   Exercise Vital Sign    Days of Exercise per Week: 0 days    Minutes of Exercise per Session: 0 min  Stress: No Stress Concern Present (03/17/2023)   [employer] of Occupational Health - Occupational Stress Questionnaire    Feeling of Stress : Not at all  Social Connections: Socially Integrated (03/17/2023)   Social Connection and Isolation Panel [NHANES]    Frequency of Communication with Friends and Family: More than three times a week    Frequency of Social Gatherings with Friends and Family: Never    Attends Religious Services: More than 4 times per year    Active Member of [employer] or Organizations: Yes    Attends Engineer, structural: More than 4 times per year    Marital Status: Living with partner    Family History: Family History  Problem Relation Age of Onset   Breast cancer Mother        dx 27-44; BRCA2+; stage III w/ chemo and radiation   Cancer Mother    Asthma Daughter    Lupus Paternal Grandmother        d. 91   Skin cancer Maternal Grandmother        "skin discoloration on arms" - not spotty   Mental illness Maternal Aunt     Allergies: No Known Allergies  Medications Prior to Admission  Medication Sig Dispense Refill Last Dose/Taking   famotidine  (PEPCID ) 20 MG tablet Take 1 tablet (20 mg total) by mouth 2 (two) times daily. 60 tablet 0 09/15/2023   ondansetron  (ZOFRAN -ODT) 8 MG disintegrating tablet Take 1 tablet (8 mg total) by mouth every 8 (eight) hours as needed for nausea or vomiting. 30 tablet 1 09/16/2023 Morning   Prenatal Vit-Fe Fumarate-FA (MULTIVITAMIN-PRENATAL) 27-0.8 MG TABS tablet Take 1 tablet by mouth daily at  12 noon.   09/15/2023   sertraline  (ZOLOFT ) 100 MG tablet Take 1 tablet (100 mg total) by mouth daily. 90 tablet 3 09/15/2023   cyclobenzaprine  (FLEXERIL ) 10 MG tablet Take 1 tablet (10 mg total) by mouth 3 (three) times daily as needed (headache). 30 tablet 0    levocetirizine (XYZAL ) 5 MG tablet TAKE ONE TABLET BY MOUTH EVERY DAY 90 tablet 1 09/14/2023   levothyroxine  (SYNTHROID ) 50 MCG tablet Take 1 tablet (50 mcg total) by mouth daily. (Patient not taking: Reported on 09/10/2023) 90 tablet 3      Review of Systems   All systems reviewed and negative except as stated  in HPI  Blood pressure 112/68, pulse (!) 105, temperature 97.6 F (36.4 C), temperature source Oral, resp. rate 20, height 4\' 11"  (1.499 m), weight 76.2 kg, last menstrual period 12/18/2022, SpO2 100%. General appearance: alert, cooperative, appears stated age, and moderate distress Lungs: clear to auscultation bilaterally Heart: regular rate and rhythm Abdomen: soft, non-tender; bowel sounds normal Pelvic: adequate, proven to 3050g (3185g VAVD) Extremities: Homans sign is negative, no sign of DVT DTR's 2+ Presentation: cephalic Fetal monitoringBaseline: 160 bpm, Variability: Good {> 6 bpm), Accelerations: Reactive, and Decelerations: Absent Uterine activityFrequency: Every 2-3 minutes Dilation: 4.5 Effacement (%): 100 Station: Ballotable Exam by:: Claudine Cullens, RNC   Prenatal labs: ABO, Rh: A/Positive/-- (10/22 1105) Antibody: Negative (02/06 0837) Rubella: 1.13 (10/22 1105) RPR: Non Reactive (02/06 0837)  HBsAg: Negative (10/22 1105)  HIV: Non Reactive (02/06 0837)  GBS: Negative/-- (04/03 1400)    Lab Results  Component Value Date   GBS Negative 08/27/2023   GTT normal Genetic screening  negative, low risk female Anatomy US  normal  Immunization History  Administered Date(s) Administered   DTP 01/17/1991   DTaP 01/17/1991   HIB (PRP-OMP) 01/17/1991   Hepatitis B 03/21/1991, 06/06/1991   IPV 01/17/1991    Influenza, High Dose Seasonal PF 04/04/2022   Influenza, Seasonal, Injecte, Preservative Fre 04/14/2023   Influenza,inj,Quad PF,6+ Mos 03/11/2017, 03/15/2019, 03/22/2021   Influenza-Unspecified 03/13/2018   OPV 01/17/1991   Td 01/23/2011   Td (Adult), 2 Lf Tetanus Toxid, Preservative Free 01/23/2011   Tdap 01/23/2011, 08/25/2015, 05/17/2017, 07/02/2023    Prenatal Transfer Tool  Maternal Diabetes: No Genetic Screening: Normal Maternal Ultrasounds/Referrals: Normal Fetal Ultrasounds or other Referrals:  None Maternal Substance Abuse:  No Significant Maternal Medications:  Meds include: Zoloft  Syntroid Significant Maternal Lab Results: Group B Strep negative Number of Prenatal Visits:greater than 3 verified prenatal visits Maternal Vaccinations:TDap and Flu Other Comments:  None   No results found for this or any previous visit (from the past 24 hours).  Patient Active Problem List   Diagnosis Date Noted   Hypothyroidism 03/17/2023   Depression with anxiety 03/17/2023   Encounter for supervision of normal pregnancy, antepartum 03/11/2023   Acquired absence of breast 06/10/2022   BRCA positive    Calculus of gallbladder without cholecystitis without obstruction 08/30/2020   FHx: BRCA gene positive 10/21/2016   Urinary retention 06/14/2012    Assessment/Plan:  Gina Alvarez is a 33 y.o. [id number] at [redacted]w[redacted]d here for SOL  #Labor:Expectant management; unless augmentation indicated/desired with Pitocin  and/or AROM #Pain: Per pt request #FWB: Cat I #GBS status:  negative #Feeding: Formula #Reproductive Life planning: Depo Provera  #Circ:  yes  Darrow End, MD  09/16/2023, 9:20 AM

## 2023-09-16 NOTE — Anesthesia Procedure Notes (Signed)
 Epidural Patient location during procedure: OB Start time: 09/16/2023 10:45 AM End time: 09/16/2023 10:55 AM  Staffing Anesthesiologist: Grace Laura, MD Performed: anesthesiologist   Preanesthetic Checklist Completed: patient identified, IV checked, risks and benefits discussed, monitors and equipment checked, pre-op evaluation and timeout performed  Epidural Patient position: sitting Prep: DuraPrep and site prepped and draped Patient monitoring: continuous pulse ox, blood pressure, heart rate and cardiac monitor Approach: midline Location: L3-L4 Injection technique: LOR air  Needle:  Needle type: Tuohy  Needle gauge: 17 G Needle length: 9 cm Needle insertion depth: 5 cm Catheter type: closed end flexible Catheter size: 19 Gauge Catheter at skin depth: 10 cm Test dose: negative  Assessment Sensory level: T8 Events: blood not aspirated, no cerebrospinal fluid, injection not painful, no injection resistance, no paresthesia and negative IV test  Additional Notes Patient identified. Risks/Benefits/Options discussed with patient including but not limited to bleeding, infection, nerve damage, paralysis, failed block, incomplete pain control, headache, blood pressure changes, nausea, vomiting, reactions to medication both or allergic, itching and postpartum back pain. Confirmed with bedside nurse the patient's most recent platelet count. Confirmed with patient that they are not currently taking any anticoagulation, have any bleeding history or any family history of bleeding disorders. Patient expressed understanding and wished to proceed. All questions were answered. Sterile technique was used throughout the entire procedure. Please see nursing notes for vital signs. Test dose was given through epidural catheter and negative prior to continuing to dose epidural or start infusion. Warning signs of high block given to the patient including shortness of breath, tingling/numbness in  hands, complete motor block, or any concerning symptoms with instructions to call for help. Patient was given instructions on fall risk and not to get out of bed. All questions and concerns addressed with instructions to call with any issues or inadequate analgesia.  Reason for block:procedure for pain

## 2023-09-16 NOTE — Progress Notes (Signed)
 Labor Progress Note Gina Alvarez is a 33 y.o. [id number] at [redacted]w[redacted]d presented for SOL S: comfortable with epidural, requesting AROM.  O:  BP 106/75   Pulse 75   Temp 97.7 F (36.5 C) (Oral)   Resp 20   Ht 4\' 11"  (1.499 m)   Wt 76.2 kg   LMP 12/18/2022   SpO2 100%   BMI 33.95 kg/m   EFM: baseline 145, accels, no decels, moderate variability TOCO: q1-83min contractions  CVE: Dilation: 6.5 Effacement (%): 100 Station: -2 Presentation: Vertex Exam by:: Cooper Denver, MD   A&P: 33 y.o. [id number] [redacted]w[redacted]d admitted for SOL #Labor: Progressing well. AROM moderate meconium, copious.  #Pain: Epidural #FWB: Cat I #GBS negative   Darrow End, MD 1:53 PM

## 2023-09-16 NOTE — Lactation Note (Signed)
 This note was copied from a baby's chart. Lactation Consultation Note  Patient Name: Gina Alvarez [id number] Date: 09/16/2023 Age:33 hours Reason for consult: Initial assessment  P3- MOB is formula feeding only. Please let LC team know if MOB is needing our services at any time.  Feeding Mother's Current Feeding Choice: Formula  Consult Status Consult Status: Complete Date: 09/16/23    Vernette Goo BS, IBCLC 09/16/2023, 6:27 PM

## 2023-09-17 ENCOUNTER — Encounter: Payer: Medicaid Other | Admitting: Women's Health

## 2023-09-17 ENCOUNTER — Other Ambulatory Visit (HOSPITAL_COMMUNITY): Payer: Self-pay

## 2023-09-17 MED ORDER — ACETAMINOPHEN 325 MG PO TABS
650.0000 mg | ORAL_TABLET | ORAL | 0 refills | Status: DC | PRN
  Filled 2023-09-17: qty 30, 3d supply, fill #0

## 2023-09-17 MED ORDER — WHITE PETROLATUM EX OINT: 1.0000 | TOPICAL_OINTMENT | CUTANEOUS | Status: DC | PRN

## 2023-09-17 MED ORDER — LIDOCAINE 1% INJECTION FOR CIRCUMCISION
0.8000 mL | INJECTION | Freq: Once | INTRAVENOUS | Status: DC
Start: 2023-09-17 — End: 2023-09-17

## 2023-09-17 MED ORDER — SENNOSIDES-DOCUSATE SODIUM 8.6-50 MG PO TABS
2.0000 | ORAL_TABLET | Freq: Every day | ORAL | 0 refills | Status: DC
  Filled 2023-09-17: qty 30, 15d supply, fill #0

## 2023-09-17 MED ORDER — SUCROSE 24% NICU/PEDS ORAL SOLUTION: 0.5000 mL | OROMUCOSAL | Status: DC | PRN

## 2023-09-17 MED ORDER — GELATIN ABSORBABLE 12-7 MM EX MISC: 1.0000 | Freq: Once | CUTANEOUS | Status: DC | PRN

## 2023-09-17 MED ORDER — EPINEPHRINE TOPICAL FOR CIRCUMCISION 0.1 MG/ML: 1.0000 [drp] | TOPICAL | Status: DC | PRN

## 2023-09-17 MED ORDER — IBUPROFEN 600 MG PO TABS
600.0000 mg | ORAL_TABLET | Freq: Four times a day (QID) | ORAL | 0 refills | Status: DC
  Filled 2023-09-17: qty 30, 8d supply, fill #0

## 2023-09-17 NOTE — Discharge Instructions (Signed)

## 2023-09-17 NOTE — Social Work (Signed)
 MOB was referred for history of depression/anxiety.  * Referral screened out by Clinical Social Worker because none of the following criteria appear to apply:  ~ History of anxiety/depression during this pregnancy, or of post-partum depression following prior delivery.  ~ Diagnosis of anxiety and/or depression within last 3 years OR * MOB's symptoms currently being treated with medication and/or therapy. Per chart review MOB is currently prescribed and taking Zoloft  100mg . Edinburgh=1  Please contact the Clinical Social Worker if needs arise, or by MOB request.  Haroldine Likens, LCSWA Clinical Social Worker [phone]

## 2023-09-17 NOTE — Anesthesia Postprocedure Evaluation (Signed)
 Anesthesia Post Note  Patient: Gina Alvarez  Procedure(s) Performed: AN AD HOC LABOR EPIDURAL     Patient location during evaluation: Mother Baby Anesthesia Type: Epidural Level of consciousness: awake, oriented and awake and alert Pain management: pain level controlled Vital Signs Assessment: post-procedure vital signs reviewed and stable Respiratory status: spontaneous breathing, nonlabored ventilation and respiratory function stable Cardiovascular status: stable Postop Assessment: no headache, patient able to bend at knees, adequate PO intake, able to ambulate, no apparent nausea or vomiting and no backache Anesthetic complications: no   No notable events documented.  Last Vitals:  Vitals:   09/17/23 0547 09/17/23 1141  BP: 108/72 104/70  Pulse: 68 85  Resp: 18 18  Temp: 36.6 C 36.6 C  SpO2: 100% 99%    Last Pain:  Vitals:   09/17/23 1220  TempSrc:   PainSc: 0-No pain   Pain Goal:                   Demisha Nokes

## 2023-09-17 NOTE — Progress Notes (Signed)
 POSTPARTUM PROGRESS NOTE  Post Partum Day 1  Subjective:  Gina Alvarez is a 33 y.o. [phone] s/p SVD at [redacted]w[redacted]d.  She reports she is doing well. No acute events overnight. She denies any problems with ambulating, voiding or po intake. Denies nausea or vomiting.  Pain is well controlled.  Lochia is appropriate.  Objective: Blood pressure 108/72, pulse 68, temperature 97.9 F (36.6 C), temperature source Oral, resp. rate 18, height 4\' 11"  (1.499 m), weight 76.2 kg, last menstrual period 12/18/2022, SpO2 100%, unknown if currently breastfeeding.  Physical Exam:  General: alert, cooperative and no distress Chest: no respiratory distress Heart:regular rate, distal pulses intact Uterine Fundus: firm, appropriately tender DVT Evaluation: No calf swelling or tenderness Extremities: trace edema Skin: warm, dry  Recent Labs    09/16/23 0941  HGB 11.9*  HCT 33.6*    Assessment/Plan: RODNEY WIGGER is a 33 y.o. [phone] s/p SVD at [redacted]w[redacted]d   PPD#1 - Doing well  Routine postpartum care Contraception: Depo Feeding: formula  Dispo: Plan for discharge today vs tomorrow.   LOS: 1 day   Ebony Goldstein, MD OB Fellow  09/17/2023, 6:21 AM

## 2023-09-18 ENCOUNTER — Ambulatory Visit: Payer: 59 | Admitting: Plastic Surgery

## 2023-09-19 ENCOUNTER — Encounter (HOSPITAL_COMMUNITY): Payer: Self-pay | Admitting: Family Medicine

## 2023-09-24 ENCOUNTER — Inpatient Hospital Stay (HOSPITAL_COMMUNITY): Admission: RE | Admit: 2023-09-24 | Payer: Medicaid Other | Source: Home / Self Care | Admitting: Family Medicine

## 2023-09-24 ENCOUNTER — Encounter: Payer: Medicaid Other | Admitting: Women's Health

## 2023-09-26 ENCOUNTER — Telehealth (HOSPITAL_COMMUNITY): Payer: Self-pay

## 2023-09-26 NOTE — Telephone Encounter (Signed)
 09/26/2023 1821  Name: Gina Alvarez MRN: [id number] DOB: [birth year]  Reason for Call:  Transition of Care Hospital Discharge Call  Contact Status: Patient Contact Status: Message  Language assistant needed:          Follow-Up Questions:    Dimple Francis Postnatal Depression Scale:  In the Past 7 Days:    PHQ2-9 Depression Scale:     Discharge Follow-up:    Post-discharge interventions: NA  Signature  Wadell Guild

## 2023-10-08 ENCOUNTER — Other Ambulatory Visit: Payer: Self-pay | Admitting: Women's Health

## 2023-10-16 ENCOUNTER — Ambulatory Visit: Admitting: Obstetrics & Gynecology

## 2023-10-20 ENCOUNTER — Ambulatory Visit: Admitting: Plastic Surgery

## 2023-10-20 ENCOUNTER — Encounter: Payer: Self-pay | Admitting: Plastic Surgery

## 2023-10-20 VITALS — BP 104/70 | HR 88 | Ht 60.0 in | Wt 160.4 lb

## 2023-10-20 DIAGNOSIS — Z1501 Genetic susceptibility to malignant neoplasm of breast: Secondary | ICD-10-CM

## 2023-10-20 DIAGNOSIS — Z1509 Genetic susceptibility to other malignant neoplasm: Secondary | ICD-10-CM

## 2023-10-20 DIAGNOSIS — Z9013 Acquired absence of bilateral breasts and nipples: Secondary | ICD-10-CM

## 2023-10-20 NOTE — Progress Notes (Signed)
 Patient ID: Gina Alvarez, female    DOB: Apr 20, 1991, 33 y.o.   MRN: [id number]   Chief Complaint  Patient presents with   Follow-up   Breast Problem    The patient is a 33 year old female here for follow-up after breast reconstruction.  The patient is 4 feet 11 inches tall and was around 150 pounds at her first visit.  She was found to be positive for the BR CA gene and decided on bilateral mastectomies.  She underwent the first stage in August 2023 and then placement of the implants in January 2024.  She has Mentor smooth round ultra high-profile gel 590 cc implants in place.  1 month ago she had a baby boy.  He is doing well and is home and healthy.  She is pleased with her results and would like to consider nipple areola tattooing in the next several months.  The implants are nice and soft.  The patient is planning on having oophorectomy in the next 2 to 3 months.    Review of Systems  Constitutional: Negative.   HENT: Negative.    Eyes: Negative.   Respiratory: Negative.    Cardiovascular: Negative.   Gastrointestinal: Negative.   Endocrine: Negative.   Genitourinary: Negative.   Musculoskeletal: Negative.     Past Medical History:  Diagnosis Date   Anxiety    Calculus of gallbladder without cholecystitis without obstruction 08/30/2020   Depression    Irregular menstrual bleeding 09/26/2013    Past Surgical History:  Procedure Laterality Date   BREAST RECONSTRUCTION WITH PLACEMENT OF TISSUE EXPANDER AND FLEX HD (ACELLULAR HYDRATED DERMIS) Bilateral 01/09/2022   Procedure: BILATERAL BREAST RECONSTRUCTION WITH PLACEMENT OF TISSUE EXPANDER AND FLEX HD (ACELLULAR HYDRATED DERMIS);  Surgeon: Thornell Flirt, DO;  Location: [facility];  Service: Plastics;  Laterality: Bilateral;   CHOLECYSTECTOMY N/A 09/14/2020   Procedure: LAPAROSCOPIC CHOLECYSTECTOMY;  Surgeon: Awilda Bogus, MD;  Location: [facility];  Service: General;  Laterality: N/A;    EXTRACORPOREAL SHOCK WAVE LITHOTRIPSY Right 06/07/2020   Procedure: EXTRACORPOREAL SHOCK WAVE LITHOTRIPSY (ESWL);  Surgeon: Adelbert Homans, MD;  Location: [facility];  Service: Urology;  Laterality: Right;   I & D EXTREMITY Right 02/14/2020   Procedure: IRRIGATION AND DEBRIDEMENT RIGHT RING FINGER WITH NAILBED REPAIR;  Surgeon: Brunilda Capra, MD;  Location: [facility];  Service: Orthopedics;  Laterality: Right;   NO PAST SURGERIES     PERCUTANEOUS PINNING Right 02/14/2020   Procedure: PERCUTANEOUS PINNING EXTREMITY;  Surgeon: Brunilda Capra, MD;  Location: [facility];  Service: Orthopedics;  Laterality: Right;   REMOVAL OF TISSUE EXPANDER AND PLACEMENT OF IMPLANT Bilateral 05/28/2022   Procedure: REMOVAL OF TISSUE EXPANDER AND PLACEMENT OF IMPLANT;  Surgeon: Thornell Flirt, DO;  Location: [facility];  Service: Plastics;  Laterality: Bilateral;   SIMPLE MASTECTOMY WITH AXILLARY SENTINEL NODE BIOPSY Bilateral 01/09/2022   Procedure: BILATERAL TOTAL MASTECTOMY;  Surgeon: Oza Blumenthal, MD;  Location:  SUR[facility]ERY C[facility];  Service: General;  Laterality: Bilateral;  LMA      Current Outpatient Medications:    acetaminophen  (TYLENOL ) 325 MG tablet, Take 2 tablets (650 mg total) by mouth every 4 (four) hours as needed (for pain scale < 4)., Disp: 30 tablet, Rfl: 0   cyclobenzaprine  (FLEXERIL ) 10 MG tablet, Take 1 tablet (10 mg total) by mouth 3 (three) times daily as needed (headache)., Disp: 30 tablet, Rfl: 0   famotidine  (PEPCID ) 20 MG tablet, TAKE 1  TABLET BY MOUTH TWICE A DAY, Disp: 60 tablet, Rfl: 0   ibuprofen  (ADVIL ) 600 MG tablet, Take 1 tablet (600 mg total) by mouth every 6 (six) hours., Disp: 30 tablet, Rfl: 0   levocetirizine (XYZAL ) 5 MG tablet, TAKE ONE TABLET BY MOUTH EVERY DAY, Disp: 90 tablet, Rfl: 1   levothyroxine  (SYNTHROID ) 50 MCG tablet, Take 1 tablet (50 mcg total) by mouth daily., Disp: 90 tablet, Rfl: 3   ondansetron  (ZOFRAN -ODT) 8 MG  disintegrating tablet, Take 1 tablet (8 mg total) by mouth every 8 (eight) hours as needed for nausea or vomiting., Disp: 30 tablet, Rfl: 1   Prenatal Vit-Fe Fumarate-FA (MULTIVITAMIN-PRENATAL) 27-0.8 MG TABS tablet, Take 1 tablet by mouth daily at 12 noon., Disp: , Rfl:    senna-docusate (SENOKOT-S) 8.6-50 MG tablet, Take 2 tablets by mouth at bedtime., Disp: 30 tablet, Rfl: 0   sertraline  (ZOLOFT ) 100 MG tablet, Take 1 tablet (100 mg total) by mouth daily., Disp: 90 tablet, Rfl: 3   Objective:   Vitals:   10/20/23 0948  BP: 104/70  Pulse: 88  SpO2: 97%    Physical Exam Vitals reviewed.  Constitutional:      Appearance: Normal appearance.  HENT:     Head: Atraumatic.  Cardiovascular:     Rate and Rhythm: Normal rate.     Pulses: Normal pulses.  Pulmonary:     Effort: Pulmonary effort is normal.  Skin:    Capillary Refill: Capillary refill takes less than 2 seconds.  Neurological:     Mental Status: She is alert and oriented to person, place, and time.  Psychiatric:        Mood and Affect: Mood normal.        Behavior: Behavior normal.        Thought Content: Thought content normal.        Judgment: Judgment normal.     Assessment & Plan:  Acquired absence of both breasts  BRCA positive  Will plan to see the patient back in 6 months and talk about revision with possible fat grafting and nipple areola tattooing.  So glad to see the patient doing well and that she has a healthy baby boy.  Pictures were obtained of the patient and placed in the chart with the patient's or guardian's permission.   Lindaann Requena Brittane Grudzinski, DO

## 2023-10-21 ENCOUNTER — Ambulatory Visit (INDEPENDENT_AMBULATORY_CARE_PROVIDER_SITE_OTHER): Admitting: Family Medicine

## 2023-10-21 ENCOUNTER — Encounter: Payer: Self-pay | Admitting: Family Medicine

## 2023-10-21 VITALS — BP 101/66 | HR 96 | Temp 98.4°F | Ht 60.0 in | Wt 160.0 lb

## 2023-10-21 DIAGNOSIS — Z1501 Genetic susceptibility to malignant neoplasm of breast: Secondary | ICD-10-CM

## 2023-10-21 DIAGNOSIS — Z1509 Genetic susceptibility to other malignant neoplasm: Secondary | ICD-10-CM

## 2023-10-21 DIAGNOSIS — Z30011 Encounter for initial prescription of contraceptive pills: Secondary | ICD-10-CM

## 2023-10-21 MED ORDER — NORGESTIM-ETH ESTRAD TRIPHASIC 0.18/0.215/0.25 MG-35 MCG PO TABS: 1.0000 | ORAL_TABLET | Freq: Every day | ORAL | 0 refills | Status: DC

## 2023-10-21 MED ORDER — NORETHINDRONE 0.35 MG PO TABS: 1.0000 | ORAL_TABLET | Freq: Every day | ORAL | 0 refills | Status: DC

## 2023-10-21 NOTE — Progress Notes (Signed)
 Subjective: AV:[id number] counseling PCP: Eliodoro Guerin, DO Gina Alvarez is a 33 y.o. female presenting to clinic today for:  1.  Contraception counseling Patient with history of BRCA positive genes.  She is status post double mastectomy and will be undergoing total hysterectomy in about 2 months.  Want to be placed on OCPs until that time.  She is not sexually active since delivery of the child.  Previously treated with copper  IUD.  Not breastfeeding.   ROS: Per HPI  No Known Allergies Past Medical History:  Diagnosis Date   Anxiety    Calculus of gallbladder without cholecystitis without obstruction 08/30/2020   Depression    Irregular menstrual bleeding 09/26/2013    Current Outpatient Medications:    acetaminophen  (TYLENOL ) 325 MG tablet, Take 2 tablets (650 mg total) by mouth every 4 (four) hours as needed (for pain scale < 4)., Disp: 30 tablet, Rfl: 0   cyclobenzaprine  (FLEXERIL ) 10 MG tablet, Take 1 tablet (10 mg total) by mouth 3 (three) times daily as needed (headache)., Disp: 30 tablet, Rfl: 0   famotidine  (PEPCID ) 20 MG tablet, TAKE 1 TABLET BY MOUTH TWICE A DAY, Disp: 60 tablet, Rfl: 0   ibuprofen  (ADVIL ) 600 MG tablet, Take 1 tablet (600 mg total) by mouth every 6 (six) hours., Disp: 30 tablet, Rfl: 0   levocetirizine (XYZAL ) 5 MG tablet, TAKE ONE TABLET BY MOUTH EVERY DAY, Disp: 90 tablet, Rfl: 1   levothyroxine  (SYNTHROID ) 50 MCG tablet, Take 1 tablet (50 mcg total) by mouth daily., Disp: 90 tablet, Rfl: 3   norethindrone (MICRONOR) 0.35 MG tablet, Take 1 tablet (0.35 mg total) by mouth daily. Cancel orthotricyclen!, Disp: 84 tablet, Rfl: 0   ondansetron  (ZOFRAN -ODT) 8 MG disintegrating tablet, Take 1 tablet (8 mg total) by mouth every 8 (eight) hours as needed for nausea or vomiting., Disp: 30 tablet, Rfl: 1   Prenatal Vit-Fe Fumarate-FA (MULTIVITAMIN-PRENATAL) 27-0.8 MG TABS tablet, Take 1 tablet by mouth daily at 12 noon., Disp: , Rfl:     senna-docusate (SENOKOT-S) 8.6-50 MG tablet, Take 2 tablets by mouth at bedtime., Disp: 30 tablet, Rfl: 0   sertraline  (ZOLOFT ) 100 MG tablet, Take 1 tablet (100 mg total) by mouth daily., Disp: 90 tablet, Rfl: 3 Social History   Socioeconomic History   Marital status: Single    Spouse name: Not on file   Number of children: Not on file   Years of education: Not on file   Highest education level: Some college, no degree  Occupational History   Not on file  Tobacco Use   Smoking status: Former    Current packs/day: 0.00    Average packs/day: 0.5 packs/day for 8.0 years (4.0 ttl pk-yrs)    Types: Cigarettes    Start date: 12/26/2009    Quit date: 12/26/2017    Years since quitting: 5.8   Smokeless tobacco: Never  Vaping Use   Vaping status: Never Used  Substance and Sexual Activity   Alcohol use: Never    Comment: maybe 1 drink per month   Drug use: Never   Sexual activity: Yes    Partners: Male    Birth control/protection: None  Other Topics Concern   Not on file  Social History Narrative   Not on file   Social Drivers of Health   Financial Resource Strain: Low Risk  (03/17/2023)   Overall Financial Resource Strain (CARDIA)    Difficulty of Paying Living Expenses: Not very hard  Food Insecurity: No Food  Insecurity (09/16/2023)   Hunger Vital Sign    Worried About Running Out of Food in the Last Year: Never true    Ran Out of Food in the Last Year: Never true  Transportation Needs: No Transportation Needs (09/16/2023)   PRAPARE - Administrator, Civil Service (Medical): No    Lack of Transportation (Non-Medical): No  Physical Activity: Inactive (03/17/2023)   Exercise Vital Sign    Days of Exercise per Week: 0 days    Minutes of Exercise per Session: 0 min  Stress: No Stress Concern Present (03/17/2023)   [employer] of Occupational Health - Occupational Stress Questionnaire    Feeling of Stress : Not at all  Social Connections: Socially Integrated  (03/17/2023)   Social Connection and Isolation Panel [NHANES]    Frequency of Communication with Friends and Family: More than three times a week    Frequency of Social Gatherings with Friends and Family: Never    Attends Religious Services: More than 4 times per year    Active Member of [employer] or Organizations: Yes    Attends Engineer, structural: More than 4 times per year    Marital Status: Living with partner  Intimate Partner Violence: Not At Risk (09/16/2023)   Humiliation, Afraid, Rape, and Kick questionnaire    Fear of Current or Ex-Partner: No    Emotionally Abused: No    Physically Abused: No    Sexually Abused: No   Family History  Problem Relation Age of Onset   Breast cancer Mother        dx 31-44; BRCA2+; stage III w/ chemo and radiation   Cancer Mother    Asthma Daughter    Lupus Paternal Grandmother        d. 50   Skin cancer Maternal Grandmother        "skin discoloration on arms" - not spotty   Mental illness Maternal Aunt     Objective: Office vital signs reviewed. BP 101/66   Pulse 96   Temp 98.4 F (36.9 C)   Ht 5' (1.524 m)   Wt 160 lb (72.6 kg)   LMP 12/18/2022   SpO2 97%   Breastfeeding No   BMI 31.25 kg/m   Physical Examination:  General: Awake, alert, well nourished, No acute distress Cardio: regular rate  Pulm: normal WOB on room air  Assessment/ Plan: 33 y.o. female   Encounter for initial prescription of contraceptive pills - Plan: norethindrone (MICRONOR) 0.35 MG tablet, DISCONTINUED: Norgestimate-Ethinyl Estradiol Triphasic 0.18/0.215/0.25 MG-35 MCG tablet  BRCA positive - Plan: norethindrone (MICRONOR) 0.35 MG tablet  I have sent in the progesterone only pill given BRCA positive with present ovaries.  Patient aware of medication   Eliodoro Guerin, DO [facility] [phone]

## 2023-12-09 ENCOUNTER — Ambulatory Visit: Admitting: Obstetrics & Gynecology

## 2023-12-21 ENCOUNTER — Encounter: Payer: Self-pay | Admitting: Obstetrics & Gynecology

## 2023-12-21 ENCOUNTER — Ambulatory Visit: Admitting: Obstetrics & Gynecology

## 2023-12-21 ENCOUNTER — Encounter: Payer: Self-pay | Admitting: Family Medicine

## 2023-12-21 VITALS — BP 107/60 | HR 77 | Ht 59.0 in | Wt 157.4 lb

## 2023-12-21 DIAGNOSIS — Z1501 Genetic susceptibility to malignant neoplasm of breast: Secondary | ICD-10-CM

## 2023-12-21 DIAGNOSIS — Z1509 Genetic susceptibility to other malignant neoplasm: Secondary | ICD-10-CM | POA: Diagnosis not present

## 2023-12-21 DIAGNOSIS — Z3041 Encounter for surveillance of contraceptive pills: Secondary | ICD-10-CM | POA: Diagnosis not present

## 2023-12-21 DIAGNOSIS — Z01818 Encounter for other preprocedural examination: Secondary | ICD-10-CM | POA: Diagnosis not present

## 2023-12-21 NOTE — Progress Notes (Signed)
 GYN VISIT Patient name: Gina Alvarez MRN [id number]  Date of birth: [birth year] Chief Complaint:   Pre-op Exam (Wants total hysterectomy)  History of Present Illness:   Gina Alvarez is a 33 y.o. [phone] female being seen today for surgical consultation.  Patient is known BRCA2 positive.  She has already completed prophylactic bilateral mastectomy.  She presents today to discuss prophylactic gynecological surgery.  Patient has completed childbearing she is currently on OCPs for contraception.  She has done some research on her own and is ready to proceed with surgical prophylaxis  She denies irregular bleeding, intermenstrual bleeding.  She denies pelvic or abdominal pain.  She reports no acute GYN concerns.     Patient's last menstrual period was 12/12/2023 (approximate).    Review of Systems:   Pertinent items are noted in HPI Denies fever/chills, dizziness, headaches, visual disturbances, fatigue, shortness of breath, chest pain, abdominal pain, vomiting, no problems with periods, bowel movements, urination, or intercourse unless otherwise stated above.  Pertinent History Reviewed:   Past Surgical History:  Procedure Laterality Date   BREAST RECONSTRUCTION WITH PLACEMENT OF TISSUE EXPANDER AND FLEX HD (ACELLULAR HYDRATED DERMIS) Bilateral 01/09/2022   Procedure: BILATERAL BREAST RECONSTRUCTION WITH PLACEMENT OF TISSUE EXPANDER AND FLEX HD (ACELLULAR HYDRATED DERMIS);  Surgeon: Lowery Estefana GORMAN, DO;  Location: [facility];  Service: Plastics;  Laterality: Bilateral;   CHOLECYSTECTOMY N/A 09/14/2020   Procedure: LAPAROSCOPIC CHOLECYSTECTOMY;  Surgeon: Kallie Manuelita BROCKS, MD;  Location: [facility];  Service: General;  Laterality: N/A;   EXTRACORPOREAL SHOCK WAVE LITHOTRIPSY Right 06/07/2020   Procedure: EXTRACORPOREAL SHOCK WAVE LITHOTRIPSY (ESWL);  Surgeon: Devere Lonni Righter, MD;  Location: [facility];  Service: Urology;  Laterality: Right;   I &  D EXTREMITY Right 02/14/2020   Procedure: IRRIGATION AND DEBRIDEMENT RIGHT RING FINGER WITH NAILBED REPAIR;  Surgeon: Murrell Drivers, MD;  Location: [facility];  Service: Orthopedics;  Laterality: Right;   NO PAST SURGERIES     PERCUTANEOUS PINNING Right 02/14/2020   Procedure: PERCUTANEOUS PINNING EXTREMITY;  Surgeon: Murrell Drivers, MD;  Location: [facility];  Service: Orthopedics;  Laterality: Right;   REMOVAL OF TISSUE EXPANDER AND PLACEMENT OF IMPLANT Bilateral 05/28/2022   Procedure: REMOVAL OF TISSUE EXPANDER AND PLACEMENT OF IMPLANT;  Surgeon: Lowery Estefana GORMAN, DO;  Location: [facility];  Service: Plastics;  Laterality: Bilateral;   SIMPLE MASTECTOMY WITH AXILLARY SENTINEL NODE BIOPSY Bilateral 01/09/2022   Procedure: BILATERAL TOTAL MASTECTOMY;  Surgeon: Vernetta Berg, MD;  Location: [facility];  Service: General;  Laterality: Bilateral;  LMA    Past Medical History:  Diagnosis Date   Anxiety    Calculus of gallbladder without cholecystitis without obstruction 08/30/2020   Depression    Irregular menstrual bleeding 09/26/2013   Reviewed problem list, medications and allergies. Physical Assessment:   Vitals:   12/21/23 0858  BP: 107/60  Pulse: 77  Weight: 157 lb 6.4 oz (71.4 kg)  Height: 4' 11 (1.499 m)  Body mass index is 31.79 kg/m.       Physical Examination:   General appearance: alert, well appearing, and in no distress  Psych: mood appropriate, normal affect  Skin: warm & dry   Cardiovascular: normal heart rate noted  Respiratory: normal respiratory effort, no distress  Abdomen: soft, non-tender   Pelvic: examination not indicated  Extremities: no edema   Chaperone: Dr. Damien Callander, PGY2 present    Assessment & Plan:  1) BRCA 2+ , Plan for  surgical prophylaxis - Discussed potential surgical risk reduction via bilateral salpingo-oophorectomy.  Discussed plan to proceed with robotic assisted procedure.  Reviewed risk benefit including but not  limited to risk of bleeding, infection, injury to surrounding organs requiring further surgical intervention.  Discussed same-day procedure.  Discussed recovery and reviewed limited physical activity postoperatively.  Questions and concerns were addressed and patient does desire to proceed - Patient is aware that menopausal symptoms are likely.  Briefly discussed intervention with either Veozah or hormone replacement therapy pending her symptoms - Will complete sterilization paperwork and will plan to proceed with surgery mid-September - Also discussed potential low risk of uterine cancer and discussed optional hysterectomy.  Patient declined at this time and we will plan to proceed with risk reducing BSO  -Medication list reviewed, no GLP-1 - Message sent to surgical coordinator  Contraceptive management - For now patient to continue with OCPs daily  Orders Placed This Encounter  Procedures   Ambulatory Referral For Surgery Scheduling    Return for surgery Sept 10.   Jemel Ono, DO Attending Obstetrician & Gynecologist, [facility] for [employer], [facility]

## 2023-12-23 ENCOUNTER — Encounter: Payer: Self-pay | Admitting: Family Medicine

## 2023-12-23 ENCOUNTER — Ambulatory Visit (INDEPENDENT_AMBULATORY_CARE_PROVIDER_SITE_OTHER): Admitting: Family Medicine

## 2023-12-23 VITALS — BP 123/88 | HR 90 | Temp 98.2°F | Ht 59.0 in | Wt 157.0 lb

## 2023-12-23 DIAGNOSIS — F53 Postpartum depression: Secondary | ICD-10-CM

## 2023-12-23 MED ORDER — HYDROXYZINE HCL 25 MG PO TABS: 12.5000 mg | ORAL_TABLET | Freq: Three times a day (TID) | ORAL | 1 refills | Status: AC | PRN

## 2023-12-23 MED ORDER — DESVENLAFAXINE SUCCINATE ER 50 MG PO TB24: 50.0000 mg | ORAL_TABLET | Freq: Every day | ORAL | 3 refills | Status: DC
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
## 2023-12-26 ENCOUNTER — Encounter: Payer: Self-pay | Admitting: Family Medicine

## 2023-12-28 ENCOUNTER — Other Ambulatory Visit: Payer: Self-pay | Admitting: Medical Genetics

## 2024-01-04 ENCOUNTER — Encounter: Payer: Self-pay | Admitting: Obstetrics & Gynecology

## 2024-01-04 ENCOUNTER — Ambulatory Visit: Admitting: Obstetrics & Gynecology

## 2024-01-04 VITALS — BP 96/48 | HR 82 | Ht 60.0 in | Wt 155.8 lb

## 2024-01-04 DIAGNOSIS — Z1509 Genetic susceptibility to other malignant neoplasm: Secondary | ICD-10-CM | POA: Diagnosis not present

## 2024-01-04 DIAGNOSIS — Z1501 Genetic susceptibility to malignant neoplasm of breast: Secondary | ICD-10-CM

## 2024-01-04 DIAGNOSIS — Z01818 Encounter for other preprocedural examination: Secondary | ICD-10-CM

## 2024-01-04 NOTE — Progress Notes (Signed)
 GYN VISIT Patient name: Gina Alvarez MRN [id number]  Date of birth: 07/09/1990 Chief Complaint:   Follow-up  History of Present Illness:   Gina Alvarez is a 33 y.o. [phone] female being seen today for preop discussion regarding upcoming surgery.  In review, pt is BRCA2+ positive and has been scheduled for robotic BSO.  She presents today regarding questions about partial hysterectomy and whether or not hysterectomy was indicated.  Pt denies pelvic pain or dysmenorrhea.  Denies issues with her menses.  Reports no other acute complaints or changes since her last visit.  Patient's last menstrual period was 12/12/2023 (approximate).    Review of Systems:   Pertinent items are noted in HPI Denies fever/chills, dizziness, headaches, visual disturbances, fatigue, shortness of breath, chest pain, abdominal pain. Pertinent History Reviewed:   Past Surgical History:  Procedure Laterality Date   BREAST RECONSTRUCTION WITH PLACEMENT OF TISSUE EXPANDER AND FLEX HD (ACELLULAR HYDRATED DERMIS) Bilateral 01/09/2022   Procedure: BILATERAL BREAST RECONSTRUCTION WITH PLACEMENT OF TISSUE EXPANDER AND FLEX HD (ACELLULAR HYDRATED DERMIS);  Surgeon: Lowery Estefana GORMAN, DO;  Location: [facility];  Service: Plastics;  Laterality: Bilateral;   CHOLECYSTECTOMY N/A 09/14/2020   Procedure: LAPAROSCOPIC CHOLECYSTECTOMY;  Surgeon: Kallie Manuelita BROCKS, MD;  Location: [facility];  Service: General;  Laterality: N/A;   EXTRACORPOREAL SHOCK WAVE LITHOTRIPSY Right 06/07/2020   Procedure: EXTRACORPOREAL SHOCK WAVE LITHOTRIPSY (ESWL);  Surgeon: Devere Lonni Righter, MD;  Location: [facility];  Service: Urology;  Laterality: Right;   I & D EXTREMITY Right 02/14/2020   Procedure: IRRIGATION AND DEBRIDEMENT RIGHT RING FINGER WITH NAILBED REPAIR;  Surgeon: Murrell Drivers, MD;  Location: [facility];  Service: Orthopedics;  Laterality: Right;   NO PAST SURGERIES     PERCUTANEOUS PINNING Right  02/14/2020   Procedure: PERCUTANEOUS PINNING EXTREMITY;  Surgeon: Murrell Drivers, MD;  Location: [facility];  Service: Orthopedics;  Laterality: Right;   REMOVAL OF TISSUE EXPANDER AND PLACEMENT OF IMPLANT Bilateral 05/28/2022   Procedure: REMOVAL OF TISSUE EXPANDER AND PLACEMENT OF IMPLANT;  Surgeon: Lowery Estefana GORMAN, DO;  Location: [facility];  Service: Plastics;  Laterality: Bilateral;   SIMPLE MASTECTOMY WITH AXILLARY SENTINEL NODE BIOPSY Bilateral 01/09/2022   Procedure: BILATERAL TOTAL MASTECTOMY;  Surgeon: Vernetta Berg, MD;  Location:  SURGERY CENTER;  Service: General;  Laterality: Bilateral;  LMA    Past Medical History:  Diagnosis Date   Anxiety    Calculus of gallbladder without cholecystitis without obstruction 08/30/2020   Depression    Irregular menstrual bleeding 09/26/2013   Reviewed problem list, medications and allergies. Physical Assessment:   Vitals:   01/04/24 1117  BP: (!) 96/48  Pulse: 82  Weight: 155 lb 12.8 oz (70.7 kg)  Height: 5' (1.524 m)  Body mass index is 30.43 kg/m.       Physical Examination:   General appearance: alert, well appearing, and in no distress  Psych: mood appropriate, normal affect  Skin: warm & dry   Cardiovascular: normal heart rate noted  Respiratory: normal respiratory effort, no distress  Abdomen: soft, non-tender   Pelvic: examination not indicated  Extremities: no edema   Chaperone: N/A    Assessment & Plan:  1) BRCA2+ -discussed that based on current diagnosis, hysterectomy would be considered elective -discussed risk/benefit including but not limited to risk of bleeding, infection, injury to surrounding organs.  Discussed that with hysterectomy recovery is much longer and potential further risks including potential cuff dehiscence, fistula.  After much discussion patient understands the difference between the 2 surgeries and wishes to proceed with just the BSO  Plan to proceed as scheduled  September 10   No orders of the defined types were placed in this encounter.   Return for as scheduled.   Pavel Gadd, DO Attending Obstetrician & Gynecologist, [facility] for [employer], [facility]

## 2024-01-05 ENCOUNTER — Encounter: Payer: Self-pay | Admitting: Family Medicine

## 2024-01-05 NOTE — Telephone Encounter (Signed)
 Luke, can you get the girls' rescheduled for our 2 open slots next Thursday?

## 2024-01-18 DIAGNOSIS — H5213 Myopia, bilateral: Secondary | ICD-10-CM | POA: Diagnosis not present

## 2024-01-19 ENCOUNTER — Encounter: Payer: Self-pay | Admitting: Family Medicine

## 2024-01-19 ENCOUNTER — Ambulatory Visit (INDEPENDENT_AMBULATORY_CARE_PROVIDER_SITE_OTHER): Admitting: Family Medicine

## 2024-01-19 VITALS — BP 107/74 | HR 93 | Temp 98.1°F | Ht 60.0 in | Wt 152.0 lb

## 2024-01-19 DIAGNOSIS — Z111 Encounter for screening for respiratory tuberculosis: Secondary | ICD-10-CM | POA: Diagnosis not present

## 2024-01-19 DIAGNOSIS — E039 Hypothyroidism, unspecified: Secondary | ICD-10-CM | POA: Diagnosis not present

## 2024-01-19 DIAGNOSIS — F53 Postpartum depression: Secondary | ICD-10-CM | POA: Diagnosis not present

## 2024-01-19 MED ORDER — DESVENLAFAXINE SUCCINATE ER 100 MG PO TB24: 100.0000 mg | ORAL_TABLET | Freq: Every day | ORAL | 3 refills | Status: AC

## 2024-01-19 NOTE — Progress Notes (Signed)
 Subjective: CC: [name], [name] is a 33 y.o. female presenting to clinic today for:  Discussed the use of AI scribe software for clinical note transcription with the patient, who gave verbal consent to proceed.  History of Present Illness   Gina Alvarez is a 33 year old female who presents with irritability and mood concerns.  She feels better overall with no crying episodes but continues to experience irritability, particularly triggered by interactions with her six-year-old daughter. She describes feeling impatient and easily triggered by her daughter's requests, leading to frustration. Her daughter is now back in school, which provides some distraction.  She is currently taking Pristiq  for her symptoms. No adverse side effects from Pristiq , stating it is effective but insufficient.  She uses hydroxyzine  occasionally, taking a couple of doses right before bed to help with sleep.  She requested a TB test by blood and inquired about RSV vaccination for her child. Not on thyroid  meds since pregnancy.      ROS: Per HPI  No Known Allergies Past Medical History:  Diagnosis Date   Anxiety    Calculus of gallbladder without cholecystitis without obstruction 08/30/2020   Depression    Encounter for supervision of normal pregnancy, antepartum 03/11/2023              NURSING     PROVIDER      Office Location    [facility]    Dating by    LMP c/w U/S at 10 wks      [facility] Model    Traditional    Anatomy U/S    Normal female '[place]'      Initiated care at     [employer]     English                     LAB RESULTS       Support Person         Genetics    NIPS: LR female  AFP:                 NT/IT (FT only)    neg                 Irregular menstrual bleeding 09/26/2013   Normal labor 09/16/2023   Urinary retention 06/14/2012   Due to perineal swelling post laceration with childbirth/delivery      Current Outpatient  Medications:    hydrOXYzine  (ATARAX ) 25 MG tablet, Take 0.5-1 tablets (12.5-25 mg total) by mouth 3 (three) times daily as needed for anxiety., Disp: 30 tablet, Rfl: 1   levocetirizine (XYZAL ) 5 MG tablet, TAKE ONE TABLET BY MOUTH EVERY DAY, Disp: 90 tablet, Rfl: 1   norethindrone  (MICRONOR ) 0.35 MG tablet, Take 1 tablet (0.35 mg total) by mouth daily. Cancel orthotricyclen!, Disp: 84 tablet, Rfl: 0   desvenlafaxine  (PRISTIQ ) 100 MG 24 hr tablet, Take 1 tablet (100 mg total) by mouth daily. Dose change, Disp: 90 tablet, Rfl: 3   levothyroxine  (SYNTHROID ) 50 MCG tablet, Take 1 tablet (50 mcg total) by mouth daily. (Patient not taking: Reported on 01/19/2024), Disp: 90 tablet, Rfl: 3 Social History   Socioeconomic History   Marital status: Single    Spouse name: Not on file   Number of children: Not on  file   Years of education: Not on file   Highest education level: Some college, no degree  Occupational History   Not on file  Tobacco Use   Smoking status: Former    Current packs/day: 0.00    Average packs/day: 0.5 packs/day for 8.0 years (4.0 ttl pk-yrs)    Types: Cigarettes    Start date: 12/26/2009    Quit date: 12/26/2017    Years since quitting: 6.0   Smokeless tobacco: Never  Vaping Use   Vaping status: Never Used  Substance and Sexual Activity   Alcohol use: Never    Comment: maybe 1 drink per month   Drug use: Never   Sexual activity: Yes    Partners: Male    Birth control/protection: Pill  Other Topics Concern   Not on file  Social History Narrative   Not on file   Social Drivers of Health   Financial Resource Strain: Low Risk  (01/18/2024)   Overall Financial Resource Strain (CARDIA)    Difficulty of Paying Living Expenses: Not hard at all  Food Insecurity: No Food Insecurity (01/18/2024)   Hunger Vital Sign    Worried About Running Out of Food in the Last Year: Never true    Ran Out of Food in the Last Year: Never true  Transportation Needs: No Transportation Needs  (01/18/2024)   PRAPARE - Administrator, Civil Service (Medical): No    Lack of Transportation (Non-Medical): No  Physical Activity: Inactive (01/18/2024)   Exercise Vital Sign    Days of Exercise per Week: 0 days    Minutes of Exercise per Session: Not on file  Stress: Stress Concern Present (01/18/2024)   [employer] of Occupational Health - Occupational Stress Questionnaire    Feeling of Stress: To some extent  Social Connections: Socially Integrated (01/18/2024)   Social Connection and Isolation Panel    Frequency of Communication with Friends and Family: More than three times a week    Frequency of Social Gatherings with Friends and Family: Once a week    Attends Religious Services: 1 to 4 times per year    Active Member of [employer] or Organizations: Yes    Attends Engineer, structural: More than 4 times per year    Marital Status: Living with partner  Intimate Partner Violence: Not At Risk (09/16/2023)   Humiliation, Afraid, Rape, and Kick questionnaire    Fear of Current or Ex-Partner: No    Emotionally Abused: No    Physically Abused: No    Sexually Abused: No   Family History  Problem Relation Age of Onset   Breast cancer Mother        dx 52-44; BRCA2+; stage III w/ chemo and radiation   Cancer Mother    Asthma Daughter    Lupus Paternal Grandmother        d. 58   Skin cancer Maternal Grandmother        skin discoloration on arms - not spotty   Mental illness Maternal Aunt     Objective: Office vital signs reviewed. BP 107/74   Pulse 93   Temp 98.1 F (36.7 C)   Ht 5' (1.524 m)   Wt 152 lb (68.9 kg)   LMP 12/12/2023 (Approximate)   SpO2 95%   BMI 29.69 kg/m   Physical Examination:  General: Awake, alert, well nourished, No acute distress HEENT sclera white, MMM.  No exophthalmos Cardio: regular rate  Pulm: normal work of breathing on room  air Psych: mood stable, speech normal, affect appropriate.     01/19/2024   10:16 AM  12/23/2023    3:23 PM 10/21/2023   10:43 AM  Depression screen PHQ 2/9  Decreased Interest 1 2 0  Down, Depressed, Hopeless 1 3 0  PHQ - 2 Score 2 5 0  Altered sleeping 0 2 0  Tired, decreased energy 1 1 0  Change in appetite 2 0 0  Feeling bad or failure about yourself  1 2 0  Trouble concentrating 0 2 0  Moving slowly or fidgety/restless 0 0 0  Suicidal thoughts 0 0 0  PHQ-9 Score 6 12 0  Difficult doing work/chores Somewhat difficult Somewhat difficult Not difficult at all      01/19/2024   10:17 AM 12/23/2023    3:22 PM 10/21/2023   10:43 AM 10/21/2023   10:41 AM  GAD 7 : Generalized Anxiety Score  Nervous, Anxious, on Edge 2 3 0 0  Control/stop worrying 1 3 0 0  Worry too much - different things 1 2 0 0  Trouble relaxing 0 2 0 0  Restless 0 2 0 0  Easily annoyed or irritable 3 2 0 0  Afraid - awful might happen 0 2 0 0  Total GAD 7 Score 7 16 0 0  Anxiety Difficulty Somewhat difficult Somewhat difficult Not difficult at all Not difficult at all   Assessment/ Plan: 33 y.o. female   Postpartum depression - Plan: desvenlafaxine  (PRISTIQ ) 100 MG 24 hr tablet  Acquired hypothyroidism - Plan: TSH + free T4  Screening-pulmonary TB - Plan: QuantiFERON-TB Gold Plus  Assessment and Plan    Depression Depression persists with irritability. Current Pristiq  treatment inadequate. No adverse effects reported. Hydroxyzine  used occasionally at bedtime. - Increase Pristiq  to 100 mg.  Hypothyroidism Thyroid  levels require monitoring. - Order thyroid  level test.    F/u April for CPE with fasting labs, sooner if concerns arise  Norene CHRISTELLA Fielding, DO [facility] [phone]

## 2024-01-20 ENCOUNTER — Ambulatory Visit: Payer: Self-pay | Admitting: Family Medicine

## 2024-01-20 LAB — TSH+FREE T4
Free T4: 0.92 ng/dL (ref 0.82–1.77)
TSH: 0.424 u[IU]/mL — ABNORMAL LOW (ref 0.450–4.500)

## 2024-01-22 LAB — QUANTIFERON-TB GOLD PLUS
QuantiFERON Mitogen Value: >10 [IU]/mL
QuantiFERON Nil Value: 0.08 [IU]/mL
QuantiFERON TB1 Ag Value: 0.08 [IU]/mL
QuantiFERON TB2 Ag Value: 0.08 [IU]/mL
QuantiFERON-TB Gold Plus: NEGATIVE

## 2024-01-26 NOTE — Telephone Encounter (Signed)
 Looks like patient may have already seen result via mychart but I did leave message for her to call back if she needed her result and/or had questions.

## 2024-01-28 NOTE — Patient Instructions (Signed)
 Gina Alvarez  01/28/2024     @PREFPERIOPPHARMACY @   Your procedure is scheduled on  02/03/2024.   Report to [facility] at  0600  A.M.   Call this number if you have problems the morning of surgery:  [phone]  If you experience any cold or flu symptoms such as cough, fever, chills, shortness of breath, etc. between now and your scheduled surgery, please notify us  at the above number.   Remember:  Do not eat after midnight.   You may drink clear liquids until 0330 am on 02/03/2024.    Clear liquids allowed are:                    Water, Juice (No red color; non-citric and without pulp; diabetics please choose diet or no sugar options), Carbonated beverages (diabetics please choose diet or no sugar options), Clear Tea (No creamer, milk, or cream, including half & half and powdered creamer), Black Coffee Only (No creamer, milk or cream, including half & half and powdered creamer), and Clear Sports drink (No red color; diabetics please choose diet or no sugar options)    Take these medicines the morning of surgery with A SIP OF WATER                                           pristiq , hydroxyzine .    Do not wear jewelry, make-up or nail polish, including gel polish,  artificial nails, or any other type of covering on natural nails (fingers and  toes).  Do not wear lotions, powders, or perfumes, or deodorant.  Do not shave 48 hours prior to surgery.  Men may shave face and neck.  Do not bring valuables to the hospital.  [facility] is not responsible for any belongings or valuables.  Contacts, dentures or bridgework may not be worn into surgery.  Leave your suitcase in the car.  After surgery it may be brought to your room.  For patients admitted to the hospital, discharge time will be determined by your treatment team.  Patients discharged the day of surgery will not be allowed to drive home and must have someone with them for 24 hours.    Special instructions:  DO  NOT smoke tobacco or vape for 24 hours before your procedure.  Please read over the following fact sheets that you were given. Coughing and Deep Breathing, Blood Transfusion Information, Surgical Site Infection Prevention, Anesthesia Post-op Instructions, and Care and Recovery After Surgery        Surgery to Remove Fallopian Tube and Ovary (Salpingo-Oophorectomy): What to Know After After the surgery to remove fallopian tubes and ovaries, it's common to have pain in your belly and light bleeding from your vagina (spotting). You may also feel tired. If both ovaries and both tubes were removed, you may have symptoms of menopause. Menopause is when you no longer have a period. Symptoms of menopause include: Hot flashes. Night sweats. Mood swings. Follow these instructions at home: Medicines Take your medicines only as told. If you were given antibiotics, take them as told. Do not stop taking them even if you start to feel better. You may need to take steps to help treat or prevent trouble pooping (constipation), such as: Taking medicines to help you poop. Eating foods high in fiber, like beans, whole grains, and fresh fruits  and vegetables. Drinking more fluids as told. Ask your provider if it's safe to drive or use machines while taking your medicine. Incision care  Take care of your cut from surgery as told. Make sure you: Wash your hands with soap and water for at least 20 seconds before and after you change your bandage. If you can't use soap and water, use hand sanitizer. Change your bandage. Leave stitches, staples, or skin glue alone. Leave tape strips alone unless you're told to take them off. You may trim the edges of the tape strips if they curl up. Check the area around your cut from surgery every day for signs of infection. Check for: Redness, swelling, or pain. Fluid or blood. Warmth. Pus or a bad smell. Activity Rest as told. Get up to take short walks at least every  2 hours during the day. This helps you breathe better and keeps your blood flowing. Ask for help if you feel weak or unsteady. Exercise as told. Do not lift anything heavier than 10 lb (4.5 kg) until you're told it's OK. Ask what things are safe for you to do at home. Ask when you can go back to work or school. General instructions Do not take baths, swim, or use a hot tub until you're told it's OK. Ask if you can shower. Wear compression stockings to reduce swelling and help prevent blood clots in your legs. Contact a health care provider if: You have pain when you pee. You have signs of infection in the cut made for surgery. You have a fever. You have a rash. You have belly pain that gets worse or does not get better with medicine. You feel light-headed. You throw up or feel like throwing up. Get help right away if: You have very bad pain in your chest or leg. You have shortness of breath. You faint. You have more bleeding or heavy bleeding from your vagina. This is bleeding that soaks 1 pad in an hour. These symptoms may be an emergency. Call 911 right away. Do not wait to see if the symptoms will go away. Do not drive yourself to the hospital. This information is not intended to replace advice given to you by your health care provider. Make sure you discuss any questions you have with your health care provider. Document Revised: 01/05/2023 Document Reviewed: 12/29/2022 Elsevier Patient Education  2024 Elsevier Inc.General Anesthesia, Adult, Care After The following information offers guidance on how to care for yourself after your procedure. Your health care provider may also give you more specific instructions. If you have problems or questions, contact your health care provider. What can I expect after the procedure? After the procedure, it is common for people to: Have pain or discomfort at the IV site. Have nausea or vomiting. Have a sore throat or hoarseness. Have trouble  concentrating. Feel cold or chills. Feel weak, sleepy, or tired (fatigue). Have soreness and body aches. These can affect parts of the body that were not involved in surgery. Follow these instructions at home: For the time period you were told by your health care provider:  Rest. Do not participate in activities where you could fall or become injured. Do not drive or use machinery. Do not drink alcohol. Do not take sleeping pills or medicines that cause drowsiness. Do not make important decisions or sign legal documents. Do not take care of children on your own. General instructions Drink enough fluid to keep your urine pale yellow. If you have sleep  apnea, surgery and certain medicines can increase your risk for breathing problems. Follow instructions from your health care provider about wearing your sleep device: Anytime you are sleeping, including during daytime naps. While taking prescription pain medicines, sleeping medicines, or medicines that make you drowsy. Return to your normal activities as told by your health care provider. Ask your health care provider what activities are safe for you. Take over-the-counter and prescription medicines only as told by your health care provider. Do not use any products that contain nicotine or tobacco. These products include cigarettes, chewing tobacco, and vaping devices, such as e-cigarettes. These can delay incision healing after surgery. If you need help quitting, ask your health care provider. Contact a health care provider if: You have nausea or vomiting that does not get better with medicine. You vomit every time you eat or drink. You have pain that does not get better with medicine. You cannot urinate or have bloody urine. You develop a skin rash. You have a fever. Get help right away if: You have trouble breathing. You have chest pain. You vomit blood. These symptoms may be an emergency. Get help right away. Call 911. Do not wait  to see if the symptoms will go away. Do not drive yourself to the hospital. Summary After the procedure, it is common to have a sore throat, hoarseness, nausea, vomiting, or to feel weak, sleepy, or fatigue. For the time period you were told by your health care provider, do not drive or use machinery. Get help right away if you have difficulty breathing, have chest pain, or vomit blood. These symptoms may be an emergency. This information is not intended to replace advice given to you by your health care provider. Make sure you discuss any questions you have with your health care provider. Document Revised: 08/09/2021 Document Reviewed: 08/09/2021 Elsevier Patient Education  2024 Elsevier Inc.How to Use Chlorhexidine  at Home in the Shower Chlorhexidine  gluconate (CHG) is a germ-killing (antiseptic) wash that's used to clean the skin. It can get rid of the germs that normally live on the skin and can keep them away for about 24 hours. If you're having surgery, you may be told to shower with CHG at home the night before surgery. This can help lower your risk for infection. To use CHG wash in the shower, follow the steps below. Supplies needed: CHG body wash. Clean washcloth. Clean towel. How to use CHG in the shower Follow these steps unless you're told to use CHG in a different way: Start the shower. Use your normal soap and shampoo to wash your face and hair. Turn off the shower or move out of the shower stream. Pour CHG onto a clean washcloth. Do not use any type of brush or rough sponge. Start at your neck, washing your body down to your toes. Make sure you: Wash the part of your body where the surgery will be done for at least 1 minute. Do not scrub. Do not use CHG on your head or face unless your health care provider tells you to. If it gets into your ears or eyes, rinse them well with water. Do not wash your genitals with CHG. Wash your back and under your arms. Make sure to wash skin  folds. Let the CHG sit on your skin for 1-2 minutes or as long as told. Rinse your entire body in the shower, including all body creases and folds. Turn off the shower. Dry off with a clean towel. Do not put  anything on your skin afterward, such as powder, lotion, or perfume. Put on clean clothes or pajamas. If it's the night before surgery, sleep in clean sheets. General tips Use CHG only as told, and follow the instructions on the label. Use the full amount of CHG as told. This is often one bottle. Do not smoke and stay away from flames after using CHG. Your skin may feel sticky after using CHG. This is normal. The sticky feeling will go away as the CHG dries. Do not use CHG: If you have a chlorhexidine  allergy or have reacted to chlorhexidine  in the past. On open wounds or areas of skin that have broken skin, cuts, or scrapes. On babies younger than 2 months of age. Contact a health care provider if: You have questions about using CHG. Your skin gets irritated or itchy. You have a rash after using CHG. You swallow any CHG. Call your local poison control center [phone] in the [place] Your eyes itch badly, or they become very red or swollen. Your hearing changes. You have trouble seeing. If you can't reach your provider, go to an urgent care or emergency room. Do not drive yourself. Get help right away if: You have swelling or tingling in your mouth or throat. You make high-pitched whistling sounds when you breathe, most often when you breathe out (wheeze). You have trouble breathing. These symptoms may be an emergency. Call 911 right away. Do not wait to see if the symptoms will go away. Do not drive yourself to the hospital. This information is not intended to replace advice given to you by your health care provider. Make sure you discuss any questions you have with your health care provider. Document Revised: 11/25/2022 Document Reviewed: 11/21/2021 Elsevier Patient  Education  2024 [employer].

## 2024-02-01 ENCOUNTER — Encounter (HOSPITAL_COMMUNITY)
Admission: RE | Admit: 2024-02-01 | Discharge: 2024-02-01 | Disposition: A | Discharge status: Home / Self Care | Payer: Self-pay | Source: Ambulatory Visit | Attending: Obstetrics & Gynecology | Admitting: Obstetrics & Gynecology

## 2024-02-01 ENCOUNTER — Encounter (HOSPITAL_COMMUNITY): Payer: Self-pay

## 2024-02-01 ENCOUNTER — Other Ambulatory Visit (HOSPITAL_COMMUNITY)
Admission: RE | Admit: 2024-02-01 | Discharge: 2024-02-01 | Disposition: A | Discharge status: Home / Self Care | Payer: Self-pay | Source: Ambulatory Visit

## 2024-02-01 ENCOUNTER — Other Ambulatory Visit: Payer: Self-pay

## 2024-02-01 ENCOUNTER — Other Ambulatory Visit (HOSPITAL_COMMUNITY)
Admission: RE | Admit: 2024-02-01 | Discharge: 2024-02-01 | Disposition: A | Discharge status: Home / Self Care | Payer: Self-pay | Source: Ambulatory Visit | Attending: Obstetrics & Gynecology | Admitting: Obstetrics & Gynecology

## 2024-02-01 DIAGNOSIS — Z01812 Encounter for preprocedural laboratory examination: Secondary | ICD-10-CM | POA: Insufficient documentation

## 2024-02-01 DIAGNOSIS — Z1501 Genetic susceptibility to malignant neoplasm of breast: Secondary | ICD-10-CM | POA: Diagnosis not present

## 2024-02-01 DIAGNOSIS — Z1509 Genetic susceptibility to other malignant neoplasm: Secondary | ICD-10-CM | POA: Insufficient documentation

## 2024-02-01 LAB — BASIC METABOLIC PANEL WITH GFR
Anion gap: 7 (ref 5–15)
BUN: 7 mg/dL (ref 6–20)
CO2: 24 mmol/L (ref 22–32)
Calcium: 8.8 mg/dL — ABNORMAL LOW (ref 8.9–10.3)
Chloride: 105 mmol/L (ref 98–111)
Creatinine, Ser: 0.56 mg/dL (ref 0.44–1.00)
GFR, Estimated: >60 mL/min (ref >60)
Glucose, Bld: 77 mg/dL (ref 70–99)
Potassium: 3.3 mmol/L — ABNORMAL LOW (ref 3.5–5.1)
Sodium: 136 mmol/L (ref 135–145)

## 2024-02-01 LAB — CBC
HCT: 34.4 % — ABNORMAL LOW (ref 36.0–46.0)
Hemoglobin: 12 g/dL (ref 12.0–15.0)
MCH: 30.2 pg (ref 26.0–34.0)
MCHC: 34.9 g/dL (ref 30.0–36.0)
MCV: 86.6 fL (ref 80.0–100.0)
Platelets: 350 K/uL (ref 150–400)
RBC: 3.97 MIL/uL (ref 3.87–5.11)
RDW: 12.3 % (ref 11.5–15.5)
WBC: 7 K/uL (ref 4.0–10.5)
nRBC: 0 % (ref 0.0–0.2)

## 2024-02-01 LAB — TYPE AND SCREEN
ABO/RH(D): A POS
Antibody Screen: NEGATIVE

## 2024-02-01 LAB — PREGNANCY, URINE: Preg Test, Ur: NEGATIVE

## 2024-02-01 NOTE — Pre-Procedure Instructions (Signed)
 Patient did not show for pre-op this morning. I attempted to call patient and had to leave her a VM. Office also notified.

## 2024-02-01 NOTE — H&P (Signed)
 [facility] Attending History and Physical  Gina Alvarez is a 33 y.o. [phone] who presents for scheduled robotic assisted laparoscopic bilateral salpingo-oophrectomy  In review, pt is known BRCA2 positive.  She has already completed prophylactic bilateral mastectomy.    Patient has completed childbearing she is currently on OCPs for contraception.  She has done some research on her own and is ready to proceed with surgical prophylaxis   She denies irregular bleeding, intermenstrual bleeding.  She denies pelvic or abdominal pain.  She reports no acute GYN concerns.     Past Medical History:  Diagnosis Date   Anxiety    Calculus of gallbladder without cholecystitis without obstruction 08/30/2020   Depression    Encounter for supervision of normal pregnancy, antepartum 03/11/2023              NURSING     PROVIDER      Office Location    [facility]    Dating by    LMP c/w U/S at 10 wks      [employer] [facility]    Traditional    Anatomy U/S    Normal female '[place]ennox'      Initiated care at     [facility]     English                     LAB RESULTS       Support Person         Genetics    NIPS: LR female  AFP:                 NT/IT (FT only)    neg                 Irregular menstrual bleeding 09/26/2013   Normal labor 09/16/2023   Urinary retention 06/14/2012   Due to perineal swelling post laceration with childbirth/delivery     Past Surgical History:  Procedure Laterality Date   BREAST RECONSTRUCTION WITH PLACEMENT OF TISSUE EXPANDER AND FLEX HD (ACELLULAR HYDRATED DERMIS) Bilateral 01/09/2022   Procedure: BILATERAL BREAST RECONSTRUCTION WITH PLACEMENT OF TISSUE EXPANDER AND FLEX HD (ACELLULAR HYDRATED DERMIS);  Surgeon: Lowery Estefana GORMAN, DO;  Location: [facility];  Service: Plastics;  Laterality: Bilateral;   CHOLECYSTECTOMY N/A 09/14/2020   Procedure: LAPAROSCOPIC CHOLECYSTECTOMY;  Surgeon: Kallie Manuelita BROCKS, MD;  Location:  [facility];  Service: General;  Laterality: N/A;   EXTRACORPOREAL SHOCK WAVE LITHOTRIPSY Right 06/07/2020   Procedure: EXTRACORPOREAL SHOCK WAVE LITHOTRIPSY (ESWL);  Surgeon: Devere Lonni Righter, MD;  Location: [facility];  Service: Urology;  Laterality: Right;   I & D EXTREMITY Right 02/14/2020   Procedure: IRRIGATION AND DEBRIDEMENT RIGHT RING FINGER WITH NAILBED REPAIR;  Surgeon: Murrell Drivers, MD;  Location: [facility];  Service: Orthopedics;  Laterality: Right;   NO PAST SURGERIES     PERCUTANEOUS PINNING Right 02/14/2020   Procedure: PERCUTANEOUS PINNING EXTREMITY;  Surgeon: Murrell Drivers, MD;  Location: [facility];  Service: Orthopedics;  Laterality: Right;   REMOVAL OF TISSUE EXPANDER AND PLACEMENT OF IMPLANT Bilateral 05/28/2022   Procedure: REMOVAL OF TISSUE EXPANDER AND PLACEMENT OF IMPLANT;  Surgeon: Lowery Estefana GORMAN, DO;  Location: [facility];  Service: Plastics;  Laterality: Bilateral;   SIMPLE MASTECTOMY WITH AXILLARY SENTINEL NODE BIOPSY Bilateral  01/09/2022   Procedure: BILATERAL TOTAL MASTECTOMY;  Surgeon: Vernetta Berg, MD;  Location: [facility];  Service: General;  Laterality: Bilateral;  LMA   OB History  Gravida Para Term Preterm AB Living  4 3 3  0 1 3  SAB IAB Ectopic Multiple Live Births  1 0 0 0 3    # Outcome Date GA Lbr Len/2nd Weight Sex Type Anes PTL Lv  4 Term 09/16/23 [redacted]w[redacted]d 09:32 / 00:43 3510 g M Vag-Spont EPI  LIV  3 Term 05/15/17 [redacted]w[redacted]d 05:20 / 02:35 3050 g F Vag-Spont EPI N LIV     Birth Comments: caput  2 SAB 05/2015          1 Term 06/12/12 [redacted]w[redacted]d 38:15 / 01:55 3185 g F Vag-Vacuum EPI N LIV  Patient denies any other pertinent gynecologic issues.  No current facility-administered medications on file prior to encounter.   Current Outpatient Medications on File Prior to Encounter  Medication Sig Dispense Refill   levocetirizine (XYZAL ) 5 MG tablet TAKE ONE TABLET BY MOUTH EVERY DAY 90 tablet 1   norethindrone   (MICRONOR ) 0.35 MG tablet Take 1 tablet (0.35 mg total) by mouth daily. Cancel orthotricyclen! 84 tablet 0   No Known Allergies  Social History:   reports that she quit smoking about 6 years ago. Her smoking use included cigarettes. She started smoking about 14 years ago. She has a 4 pack-year smoking history. She has never used smokeless tobacco. She reports that she does not drink alcohol and does not use drugs. Family History  Problem Relation Age of Onset   Breast cancer Mother        dx 81-44; BRCA2+; stage III w/ chemo and radiation   Cancer Mother    Asthma Daughter    Lupus Paternal Grandmother        d. 49   Skin cancer Maternal Grandmother        skin discoloration on arms - not spotty   Mental illness Maternal Aunt     Review of Systems: Pertinent items noted in HPI and remainder of comprehensive ROS otherwise negative.  PHYSICAL EXAM: Last menstrual period 01/01/2024, not currently breastfeeding. Vitals pending  CONSTITUTIONAL: Well-developed, well-nourished female in no acute distress.  SKIN: Skin is warm and dry. No rash noted. Not diaphoretic. No erythema. No pallor. NEUROLOGIC: Alert and oriented to person, place, and time. Normal reflexes, muscle tone coordination. No cranial nerve deficit noted. PSYCHIATRIC: Normal mood and affect. Normal behavior. Normal judgment and thought content. CARDIOVASCULAR: Normal heart rate noted, regular rhythm RESPIRATORY: Effort and breath sounds normal, no problems with respiration noted ABDOMEN: Soft, nontender, nondistended. PELVIC: deferred MUSCULOSKELETAL: no calf tenderness bilaterally EXT: no edema bilaterally, normal pulses  Labs: Results for orders placed or performed during the hospital encounter of 02/01/24 (from the past 2 weeks)  CBC   Collection Time: 02/01/24  1:56 PM  Result Value Ref Range   WBC 7.0 4.0 - 10.5 K/uL   RBC 3.97 3.87 - 5.11 MIL/uL   Hemoglobin 12.0 12.0 - 15.0 g/dL   HCT 65.5 (L) 63.9 - 53.9 %    MCV 86.6 80.0 - 100.0 fL   MCH 30.2 26.0 - 34.0 pg   MCHC 34.9 30.0 - 36.0 g/dL   RDW 87.6 88.4 - 84.4 %   Platelets 350 150 - 400 K/uL   nRBC 0.0 0.0 - 0.2 %  Basic metabolic panel   Collection Time: 02/01/24  1:56 PM  Result Value Ref Range  Sodium 136 135 - 145 mmol/L   Potassium 3.3 (L) 3.5 - 5.1 mmol/L   Chloride 105 98 - 111 mmol/L   CO2 24 22 - 32 mmol/L   Glucose, Bld 77 70 - 99 mg/dL   BUN 7 6 - 20 mg/dL   Creatinine, Ser 9.43 0.44 - 1.00 mg/dL   Calcium 8.8 (L) 8.9 - 10.3 mg/dL   GFR, Estimated >39 >39 mL/min   Anion gap 7 5 - 15  Pregnancy, urine   Collection Time: 02/01/24  1:56 PM  Result Value Ref Range   Preg Test, Ur NEGATIVE NEGATIVE  Type and screen [facility]   Collection Time: 02/01/24  1:56 PM  Result Value Ref Range   ABO/RH(D) A POS    Antibody Screen NEG    Sample Expiration      02/15/2024,2359 Performed at [facility], [address] [place], [place] [address]   Results for orders placed or performed in visit on 01/19/24 (from the past 2 weeks)  TSH + free T4   Collection Time: 01/19/24 10:44 AM  Result Value Ref Range   TSH 0.424 (L) 0.450 - 4.500 uIU/mL   Free T4 0.92 0.82 - 1.77 ng/dL  QuantiFERON-TB Gold Plus   Collection Time: 01/19/24 10:45 AM  Result Value Ref Range   QuantiFERON Incubation Incubation performed.    QuantiFERON Criteria Comment    QuantiFERON TB1 Ag Value 0.08 IU/mL   QuantiFERON TB2 Ag Value 0.08 IU/mL   QuantiFERON Nil Value 0.08 IU/mL   QuantiFERON Mitogen Value >10.00 IU/mL   QuantiFERON-TB Gold Plus Negative Negative    Assessment: BRCA2   Plan: Robotic assisted laparoscopic bilateral salpingo-oophorectomy -NPO -IV Toradol  -LR @ 125cc/hr -SCDs to OR -Risk/benefits and alternatives reviewed with the patient including but not limited to risk of bleeding, infection and injury to surrounding organs.  Questions and concerns were addressed and pt desires to proceed  Alitza Cowman, DO Attending  Obstetrician & Gynecologist, [facility] for [facility], [facility]

## 2024-02-02 ENCOUNTER — Ambulatory Visit: Payer: Self-pay | Admitting: Obstetrics & Gynecology

## 2024-02-03 ENCOUNTER — Encounter (HOSPITAL_COMMUNITY): Payer: Self-pay | Admitting: Obstetrics & Gynecology

## 2024-02-03 ENCOUNTER — Ambulatory Visit (HOSPITAL_BASED_OUTPATIENT_CLINIC_OR_DEPARTMENT_OTHER): Admitting: Anesthesiology

## 2024-02-03 ENCOUNTER — Ambulatory Visit (HOSPITAL_COMMUNITY): Admitting: Anesthesiology

## 2024-02-03 ENCOUNTER — Encounter (HOSPITAL_COMMUNITY)
Admission: RE | Disposition: A | Discharge status: Home / Self Care | Payer: Self-pay | Source: Home / Self Care | Attending: Obstetrics & Gynecology

## 2024-02-03 ENCOUNTER — Ambulatory Visit (HOSPITAL_COMMUNITY)
Admission: RE | Admit: 2024-02-03 | Discharge: 2024-02-03 | Disposition: A | Discharge status: Home / Self Care | Attending: Obstetrics & Gynecology | Admitting: Obstetrics & Gynecology

## 2024-02-03 ENCOUNTER — Other Ambulatory Visit: Payer: Self-pay

## 2024-02-03 DIAGNOSIS — F419 Anxiety disorder, unspecified: Secondary | ICD-10-CM | POA: Diagnosis not present

## 2024-02-03 DIAGNOSIS — Z1509 Genetic susceptibility to other malignant neoplasm: Secondary | ICD-10-CM | POA: Diagnosis not present

## 2024-02-03 DIAGNOSIS — F32A Depression, unspecified: Secondary | ICD-10-CM | POA: Insufficient documentation

## 2024-02-03 DIAGNOSIS — Z4002 Encounter for prophylactic removal of ovary: Secondary | ICD-10-CM

## 2024-02-03 DIAGNOSIS — Z87891 Personal history of nicotine dependence: Secondary | ICD-10-CM | POA: Insufficient documentation

## 2024-02-03 DIAGNOSIS — E039 Hypothyroidism, unspecified: Secondary | ICD-10-CM | POA: Diagnosis not present

## 2024-02-03 DIAGNOSIS — Z1502 Genetic susceptibility to malignant neoplasm of ovary: Secondary | ICD-10-CM

## 2024-02-03 DIAGNOSIS — Z1501 Genetic susceptibility to malignant neoplasm of breast: Secondary | ICD-10-CM | POA: Diagnosis not present

## 2024-02-03 DIAGNOSIS — Z793 Long term (current) use of hormonal contraceptives: Secondary | ICD-10-CM | POA: Diagnosis not present

## 2024-02-03 DIAGNOSIS — Z4003 Encounter for prophylactic removal of fallopian tube(s): Secondary | ICD-10-CM | POA: Diagnosis present

## 2024-02-03 DIAGNOSIS — N8302 Follicular cyst of left ovary: Secondary | ICD-10-CM | POA: Diagnosis not present

## 2024-02-03 HISTORY — PX: SALPINGO-OOPHORECTOMY, BILATERAL, ROBOT ASSISTED, LAPAROSCOPIC: SHX7402

## 2024-02-03 SURGERY — SALPINGO-OOPHORECTOMY, BILATERAL, ROBOT ASSISTED, LAPAROSCOPIC, D5
Anesthesia: General | Site: Abdomen | Laterality: Bilateral

## 2024-02-03 MED ORDER — ROCURONIUM BROMIDE 10 MG/ML (PF) SYRINGE
PREFILLED_SYRINGE | INTRAVENOUS | Status: AC
  Filled 2024-02-03: qty 10

## 2024-02-03 MED ORDER — EPHEDRINE SULFATE-NACL 50-0.9 MG/10ML-% IV SOSY
PREFILLED_SYRINGE | INTRAVENOUS | Status: DC | PRN
  Administered 2024-02-03 (×5): 5 mg via INTRAVENOUS

## 2024-02-03 MED ORDER — LIDOCAINE 2% (20 MG/ML) 5 ML SYRINGE
INTRAMUSCULAR | Status: AC
  Filled 2024-02-03: qty 5

## 2024-02-03 MED ORDER — ONDANSETRON HCL 4 MG PO TABS: 4.0000 mg | ORAL_TABLET | Freq: Three times a day (TID) | ORAL | 0 refills | Status: AC | PRN

## 2024-02-03 MED ORDER — LACTATED RINGERS IV SOLN
INTRAVENOUS | Status: DC
Start: 2024-02-03 — End: 2024-02-03

## 2024-02-03 MED ORDER — SUGAMMADEX SODIUM 200 MG/2ML IV SOLN
INTRAVENOUS | Status: DC | PRN
  Administered 2024-02-03: 200 mg via INTRAVENOUS

## 2024-02-03 MED ORDER — PROPOFOL 10 MG/ML IV BOLUS
INTRAVENOUS | Status: AC
  Filled 2024-02-03: qty 20

## 2024-02-03 MED ORDER — EPHEDRINE 5 MG/ML INJ
INTRAVENOUS | Status: AC
  Filled 2024-02-03: qty 5

## 2024-02-03 MED ORDER — CHLORHEXIDINE GLUCONATE 0.12 % MT SOLN
15.0000 mL | Freq: Once | OROMUCOSAL | Status: AC
  Administered 2024-02-03: 15 mL via OROMUCOSAL

## 2024-02-03 MED ORDER — ONDANSETRON HCL 4 MG/2ML IJ SOLN
INTRAMUSCULAR | Status: DC | PRN
  Administered 2024-02-03: 4 mg via INTRAVENOUS

## 2024-02-03 MED ORDER — KETOROLAC TROMETHAMINE 30 MG/ML IJ SOLN
30.0000 mg | INTRAMUSCULAR | Status: AC
  Administered 2024-02-03: 30 mg via INTRAVENOUS
  Filled 2024-02-03: qty 1

## 2024-02-03 MED ORDER — DOCUSATE SODIUM 100 MG PO CAPS: 100.0000 mg | ORAL_CAPSULE | Freq: Two times a day (BID) | ORAL | 0 refills | Status: AC

## 2024-02-03 MED ORDER — POVIDONE-IODINE 10 % EX SWAB
2.0000 | Freq: Once | CUTANEOUS | Status: AC
  Administered 2024-02-03: 2 via TOPICAL

## 2024-02-03 MED ORDER — PROPOFOL 10 MG/ML IV BOLUS
INTRAVENOUS | Status: DC | PRN
  Administered 2024-02-03: 30 mg via INTRAVENOUS
  Administered 2024-02-03: 100 mg via INTRAVENOUS
  Administered 2024-02-03: 30 mg via INTRAVENOUS

## 2024-02-03 MED ORDER — OXYCODONE HCL 5 MG/5ML PO SOLN: 5.0000 mg | Freq: Once | ORAL | Status: DC | PRN

## 2024-02-03 MED ORDER — BUPIVACAINE HCL (PF) 0.25 % IJ SOLN
INTRAMUSCULAR | Status: AC
  Filled 2024-02-03: qty 60

## 2024-02-03 MED ORDER — ORAL CARE MOUTH RINSE: 15.0000 mL | Freq: Once | OROMUCOSAL | Status: AC

## 2024-02-03 MED ORDER — PHENYLEPHRINE 80 MCG/ML (10ML) SYRINGE FOR IV PUSH (FOR BLOOD PRESSURE SUPPORT)
PREFILLED_SYRINGE | INTRAVENOUS | Status: AC
  Filled 2024-02-03: qty 10

## 2024-02-03 MED ORDER — SUGAMMADEX SODIUM 200 MG/2ML IV SOLN
INTRAVENOUS | Status: AC
  Filled 2024-02-03: qty 2

## 2024-02-03 MED ORDER — SCOPOLAMINE 1 MG/3DAYS TD PT72
MEDICATED_PATCH | TRANSDERMAL | Status: AC
  Administered 2024-02-03: 1 mg
  Filled 2024-02-03: qty 1

## 2024-02-03 MED ORDER — OXYCODONE HCL 5 MG PO TABS: 5.0000 mg | ORAL_TABLET | Freq: Once | ORAL | Status: DC | PRN

## 2024-02-03 MED ORDER — ACETAMINOPHEN 325 MG PO TABS: 650.0000 mg | ORAL_TABLET | Freq: Four times a day (QID) | ORAL | Status: AC | PRN

## 2024-02-03 MED ORDER — ROCURONIUM BROMIDE 10 MG/ML (PF) SYRINGE
PREFILLED_SYRINGE | INTRAVENOUS | Status: DC | PRN
  Administered 2024-02-03: 50 mg via INTRAVENOUS
  Administered 2024-02-03: 5 mg via INTRAVENOUS

## 2024-02-03 MED ORDER — STERILE WATER FOR IRRIGATION IR SOLN
Status: DC | PRN
  Administered 2024-02-03: 500 mL

## 2024-02-03 MED ORDER — LACTATED RINGERS IV SOLN: INTRAVENOUS | Status: DC

## 2024-02-03 MED ORDER — ONDANSETRON HCL 4 MG/2ML IJ SOLN
INTRAMUSCULAR | Status: AC
  Filled 2024-02-03: qty 2

## 2024-02-03 MED ORDER — 0.9 % SODIUM CHLORIDE (POUR BTL) OPTIME
TOPICAL | Status: DC | PRN
  Administered 2024-02-03: 1000 mL

## 2024-02-03 MED ORDER — FENTANYL CITRATE (PF) 100 MCG/2ML IJ SOLN
INTRAMUSCULAR | Status: AC
  Filled 2024-02-03: qty 2

## 2024-02-03 MED ORDER — LIDOCAINE 2% (20 MG/ML) 5 ML SYRINGE
INTRAMUSCULAR | Status: DC | PRN
  Administered 2024-02-03: 60 mg via INTRAVENOUS

## 2024-02-03 MED ORDER — IBUPROFEN 600 MG PO TABS: 600.0000 mg | ORAL_TABLET | Freq: Four times a day (QID) | ORAL | 0 refills | Status: AC | PRN

## 2024-02-03 MED ORDER — FENTANYL CITRATE (PF) 100 MCG/2ML IJ SOLN
INTRAMUSCULAR | Status: DC | PRN
  Administered 2024-02-03: 50 ug via INTRAVENOUS
  Administered 2024-02-03: 25 ug via INTRAVENOUS
  Administered 2024-02-03: 50 ug via INTRAVENOUS
  Administered 2024-02-03: 25 ug via INTRAVENOUS

## 2024-02-03 MED ORDER — SCOPOLAMINE 1 MG/3DAYS TD PT72
1.0000 | MEDICATED_PATCH | Freq: Once | TRANSDERMAL | Status: DC
  Administered 2024-02-03: 1 mg via TRANSDERMAL

## 2024-02-03 MED ORDER — OXYCODONE HCL 5 MG PO TABS: 5.0000 mg | ORAL_TABLET | Freq: Four times a day (QID) | ORAL | 0 refills | Status: AC | PRN

## 2024-02-03 MED ORDER — BUPIVACAINE HCL (PF) 0.5 % IJ SOLN
INTRAMUSCULAR | Status: DC | PRN
  Administered 2024-02-03: 40 mL

## 2024-02-03 MED ORDER — FENTANYL CITRATE PF 50 MCG/ML IJ SOSY
25.0000 ug | PREFILLED_SYRINGE | INTRAMUSCULAR | Status: DC | PRN
  Administered 2024-02-03: 25 ug via INTRAVENOUS
  Filled 2024-02-03: qty 1

## 2024-02-03 MED ORDER — DEXAMETHASONE SODIUM PHOSPHATE 10 MG/ML IJ SOLN
INTRAMUSCULAR | Status: AC
  Filled 2024-02-03: qty 1

## 2024-02-03 MED ORDER — DEXMEDETOMIDINE HCL IN NACL 80 MCG/20ML IV SOLN
INTRAVENOUS | Status: DC | PRN
  Administered 2024-02-03: 8 ug via INTRAVENOUS

## 2024-02-03 SURGICAL SUPPLY — 43 items
BLADE SURG SZ11 CARB STEEL (BLADE) ×2 IMPLANT
CANNULA CAP OBTURATR AIRSEAL 8 (CAP) ×2 IMPLANT
CANNULA REDUCER 12-8 DVNC XI (CANNULA) ×2 IMPLANT
CAUTERY HOOK MNPLR 1.6 DVNC XI (INSTRUMENTS) IMPLANT
CHLORAPREP W/TINT 26 (MISCELLANEOUS) ×2 IMPLANT
CNTNR URN SCR LID CUP LEK RST (MISCELLANEOUS) IMPLANT
COVER LIGHT HANDLE (MISCELLANEOUS) IMPLANT
COVER MAYO STAND XLG (MISCELLANEOUS) ×2 IMPLANT
DERMABOND ADVANCED .7 DNX12 (GAUZE/BANDAGES/DRESSINGS) ×2 IMPLANT
DILATOR CANAL MILEX (MISCELLANEOUS) IMPLANT
DRAPE ARM DVNC X/XI (DISPOSABLE) ×6 IMPLANT
DRAPE COLUMN DVNC XI (DISPOSABLE) ×2 IMPLANT
DRAPE UTILITY W/TAPE 26X15 (DRAPES) ×4 IMPLANT
ELECTRODE REM PT RTRN 9FT ADLT (ELECTROSURGICAL) ×2 IMPLANT
GAUZE 4X4 16PLY ~~LOC~~+RFID DBL (SPONGE) ×4 IMPLANT
GLOVE BIO SURGEON STRL SZ 6.5 (GLOVE) ×4 IMPLANT
GLOVE BIOGEL PI IND STRL 7.0 (GLOVE) ×10 IMPLANT
GOWN STRL REUS W/ TWL LRG LVL3 (GOWN DISPOSABLE) ×4 IMPLANT
GOWN STRL REUS W/TWL LRG LVL3 (GOWN DISPOSABLE) ×2 IMPLANT
KIT PINK PAD W/HEAD ARM REST (MISCELLANEOUS) ×2 IMPLANT
KIT TURNOVER CYSTO (KITS) ×2 IMPLANT
MANIFOLD NEPTUNE II (INSTRUMENTS) ×2 IMPLANT
NDL HYPO 25X1 1.5 SAFETY (NEEDLE) ×2 IMPLANT
NDL INSUFFLATION 14GA 120MM (NEEDLE) ×2 IMPLANT
NEEDLE HYPO 25X1 1.5 SAFETY (NEEDLE) ×1 IMPLANT
NEEDLE INSUFFLATION 14GA 120MM (NEEDLE) ×1 IMPLANT
OBTURATOR OPTICALSTD 8 DVNC (TROCAR) ×2 IMPLANT
PACK PERI GYN (CUSTOM PROCEDURE TRAY) ×2 IMPLANT
POSITIONER HEAD 8X9X4 ADT (SOFTGOODS) ×2 IMPLANT
SEAL UNIV 5-12 XI (MISCELLANEOUS) ×6 IMPLANT
SEALER VESSEL EXT DVNC XI (MISCELLANEOUS) IMPLANT
SET BASIN LINEN APH (SET/KITS/TRAYS/PACK) ×2 IMPLANT
SET TUBE FILTERED XL AIRSEAL (SET/KITS/TRAYS/PACK) ×2 IMPLANT
SET TUBE SMOKE EVAC HIGH FLOW (TUBING) IMPLANT
SOL .9 NS 3000ML IRR UROMATIC (IV SOLUTION) ×2 IMPLANT
SOL ANTI FOG 6CC (MISCELLANEOUS) ×2 IMPLANT
SUT MNCRL AB 4-0 PS2 18 (SUTURE) ×2 IMPLANT
SUT VICRYL 0 UR6 27IN ABS (SUTURE) IMPLANT
SYR CONTROL 10ML LL (SYRINGE) ×2 IMPLANT
SYSTEM BAG RETRIEVAL 10MM (BASKET) ×2 IMPLANT
SYSTEM RETRIEVL 5MM INZII UNIV (BASKET) IMPLANT
TAPE TRANSPORE STRL 2 31045 (GAUZE/BANDAGES/DRESSINGS) ×2 IMPLANT
WATER STERILE IRR 500ML POUR (IV SOLUTION) ×2 IMPLANT

## 2024-02-03 NOTE — Anesthesia Postprocedure Evaluation (Signed)
 Anesthesia Post Note  Patient: ZAYANA SALVADOR  Procedure(s) Performed: SALPINGO-OOPHORECTOMY, BILATERAL, ROBOT ASSISTED, LAPAROSCOPIC, D5 (Bilateral: Abdomen)  Patient location during evaluation: PACU Anesthesia Type: General Level of consciousness: awake and alert Pain management: pain level controlled Vital Signs Assessment: post-procedure vital signs reviewed and stable Respiratory status: spontaneous breathing, nonlabored ventilation, respiratory function stable and patient connected to nasal cannula oxygen Cardiovascular status: blood pressure returned to baseline and stable Postop Assessment: no apparent nausea or vomiting Anesthetic complications: no   No notable events documented.   Last Vitals:  Vitals:   02/03/24 0940 02/03/24 0945  BP: (P) 108/73 111/75  Pulse: (P) 99 97  Resp: (P) 17 14  Temp: (P) 36.6 C   SpO2: (P) 100% 96%    Last Pain:  Vitals:   02/03/24 0940  TempSrc:   PainSc: (P) 0-No pain                 Andrea Limes

## 2024-02-03 NOTE — Anesthesia Procedure Notes (Signed)
 Procedure Name: Intubation Date/Time: 02/03/2024 7:40 AM  Performed by: Barbarann Verneita RAMAN, CRNAPre-anesthesia Checklist: Patient identified, Patient being monitored, Timeout performed, Emergency Drugs available and Suction available Patient Re-evaluated:Patient Re-evaluated prior to induction Oxygen Delivery Method: Circle system utilized Preoxygenation: Pre-oxygenation with 100% oxygen Induction Type: IV induction Ventilation: Mask ventilation without difficulty Laryngoscope Size: Mac and 3 Grade View: Grade I Tube type: Oral Tube size: 7.0 mm Number of attempts: 1 Airway Equipment and Method: Stylet Placement Confirmation: ETT inserted through vocal cords under direct vision, positive ETCO2 and breath sounds checked- equal and bilateral Secured at: 21 cm Tube secured with: Tape Dental Injury: Teeth and Oropharynx as per pre-operative assessment

## 2024-02-03 NOTE — Addendum Note (Signed)
 Addendum  created 02/03/24 1049 by Barbarann Verneita RAMAN, CRNA   Flowsheet accepted

## 2024-02-03 NOTE — Discharge Instructions (Addendum)
 Post Anesthesia Home Care Instructions  Activity: Get plenty of rest for the remainder of the day. A responsible individual must stay with you for 24 hours following the procedure.  For the next 24 hours, DO NOT: -Drive a car -Advertising copywriter -Drink alcoholic beverages -Take any medication unless instructed by your physician -Make any legal decisions or sign important papers.  Meals: Start with liquid foods such as gelatin or soup. Progress to regular foods as tolerated. Avoid greasy, spicy, heavy foods. If nausea and/or vomiting occur, drink only clear liquids until the nausea and/or vomiting subsides. Call your physician if vomiting continues.  Special Instructions/Symptoms: Your throat may feel dry or sore from the anesthesia or the breathing tube placed in your throat during surgery. If this causes discomfort, gargle with warm salt water . The discomfort should disappear within 24 hours.  If you had a scopolamine  patch placed behind your ear for the management of post- operative nausea and/or vomiting:  1. The medication in the patch is effective for 72 hours, after which it should be removed.  Wrap patch in a tissue and discard in the trash. Wash hands thoroughly with soap and water . 2. You may remove the patch earlier than 72 hours if you experience unpleasant side effects which may include dry mouth, dizziness or visual disturbances. 3. Avoid touching the patch. Wash your hands with soap and water  after contact with the patch.  HOME INSTRUCTIONS  Please note any unusual or excessive bleeding, pain, swelling. Mild dizziness or drowsiness are normal for about 24 hours after surgery.   Shower when comfortable  Restrictions: No driving for 24 hours or while taking pain medications.  Activity:  No heavy lifting (> 10 lbs), nothing in vagina (no tampons, douching, or intercourse) x 2 weeks; no tub baths for 2 weeks Vaginal spotting is expected but if your bleeding is heavy, period  like,  please call the office   Incision: the bandaids will fall off when they are ready to; you may clean your incision with mild soap and water  but do not rub or scrub the incision site.  You may experience slight bloody drainage from your incision periodically.  This is normal.  If you experience a large amount of drainage or the incision opens, please call your physician who will likely direct you to the emergency department.  Diet:  You may return to your regular diet.  Do not eat large meals.  Eat small frequent meals throughout the day.  Continue to drink a good amount of water  at least 6-8 glasses of water  per day, hydration is very important for the healing process.  Pain Management: Take Ibuprofen  every 6 hours with food as needed for pain. In between the ibuprofen , you can also take tylenol .  For moderate to severe pain, a prescription of oxycodone  has been sent in to take along with Tylenol .   -If the Oxycodone  is too strong, switch to over the counter tylenol  instead.    Always take prescription pain medication with food.  Oxycodone  may cause constipation, you may want to take a stool softener while taking this medication.  A prescription of colace has been sent in to take twice daily if needed while taking the oxycodone .  Be sure to drink plenty of fluids and increase your fiber to help with constipation.  Alcohol -- Avoid for 24 hours and while taking pain medications.  Nausea: Take sips of ginger ale or soda  Fever -- Call physician if temperature over 101 degrees  Follow up:  If you do not already have a follow up appointment scheduled, please call the office at [phone].  If you experience fever (a temperature greater than 100.4), pain unrelieved by pain medication, shortness of breath, swelling of a single leg, or any other symptoms which are concerning to you please the office immediately.

## 2024-02-03 NOTE — Anesthesia Preprocedure Evaluation (Addendum)
 Anesthesia Evaluation  Patient identified by MRN, date of birth, ID band Patient awake    Reviewed: Allergy & Precautions, H&P , NPO status , Patient's Chart, lab work & pertinent test results  Airway Mallampati: II  TM Distance: >3 FB Neck ROM: Full    Dental no notable dental hx.    Pulmonary former smoker   Pulmonary exam normal breath sounds clear to auscultation       Cardiovascular negative cardio ROS Normal cardiovascular exam Rhythm:Regular Rate:Normal     Neuro/Psych  PSYCHIATRIC DISORDERS Anxiety Depression    negative neurological ROS     GI/Hepatic negative GI ROS, Neg liver ROS,,,  Endo/Other  Hypothyroidism    Renal/GU negative Renal ROS  negative genitourinary   Musculoskeletal negative musculoskeletal ROS (+)    Abdominal   Peds negative pediatric ROS (+)  Hematology negative hematology ROS (+)   Anesthesia Other Findings   Reproductive/Obstetrics negative OB ROS                              Anesthesia Physical Anesthesia Plan  ASA: 2  Anesthesia Plan: General   Post-op Pain Management:    Induction: Intravenous  PONV Risk Score and Plan: Scopolamine  patch - Pre-op  Airway Management Planned: Oral ETT  Additional Equipment:   Intra-op Plan:   Post-operative Plan: Extubation in OR  Informed Consent: I have reviewed the patients History and Physical, chart, labs and discussed the procedure including the risks, benefits and alternatives for the proposed anesthesia with the patient or authorized representative who has indicated his/her understanding and acceptance.     Dental advisory given  Plan Discussed with: CRNA  Anesthesia Plan Comments:         Anesthesia Quick Evaluation

## 2024-02-03 NOTE — Op Note (Signed)
 PREOPERATIVE DIAGNOSIS:  1) BRCA 2 positive POSTOPERATIVE DIAGNOSIS: same PROCEDURE PERFORMED: Robotic assisted- laparoscopic bilateral salpingo-oophorectomy SURGEON: Dr. Delon Prude ANESTHESIA: General endotracheal.  ESTIMATED BLOOD LOSS: 10 cc.  IV FLUIDS: 900 cc of crystalloid.  SPECIMEN(S): Pelvic washings, bilateral fallopian tubes and ovaries COMPLICATIONS: None.  CONDITION: Stable.   FINDINGS: No ascites or peritoneal studding was appreciated.  Grossly normal appearing bowel, normal-appearing appendix.  Normal liver.  Normal paracolic gutters normal anterior and posterior cul-de-sac.Gina Alvarez  Uterus normal size and shape.  Ovaries and tubes were normal in appearance-small right hemorrhagic cyst approximately 1 cm in size   Informed consent was obtained from the patient prior to taking her to the operating room where anesthesia was found to be adequate. She was placed in dorsal lithotomy position and examined under anesthesia. She was prepped and draped in normal sterile fashion.  A bi-valve speculum was then placed.  The anterior lip of the cervix was grasped with the single tooth tenaculum.  The hulka uterine manipulator was then advanced into the uterus to provide uterine mobility. The speculum and tenaculum were then removed.    Attention was then turned to the patient's abdomen where an 8mm supraumbilical skin incision was made with the scalpel. The veress needle was carefully introduced into the peritoneal cavity while tenting the abdominal wall. Intraperitoneal placement was confirmed by use of a saline-drop test.  The gas was connected and confirmed intrabdominal placement by a low initial pressure of 6 mmHg. The abdomen was then insuflated with CO2 gas. The trocar and sleeve were then advanced without difficulty into the abdomen under direct visualization. Intraabdominal placement was confirmed by the laparoscope and surveillance of the abdomen was performed. Grossly normal appearing  abdomen as mentioned in findings above.  Pelvic washings were obtained. Two additional 8mm skin incision were made  with placement of the trocar under direct visualization. The patient was placed in Trendelenburg position and the [employer] robotic device was docked.  Next, attention was turned to the console where the bilateral salpingectomy was performed.      The left adnexa was grasped, the left ureter was identified.  A small incision was made in the mesosalpinx and extended laterally to isolate the IP ligament.  Using the vessel sealer, the IP ligament was ligated and complete resection of the fallopian tube and ovary was performed to ensure complete removal of all adnexal tissue.  A similar procedure was performed on the right with identification of the right ureter prior to resection of the right fallopian tube and ovary.  An Endo Catch bag was then advanced and both specimens were placed within the bag.  The right adnexa was easily identified due to the hemorrhagic cyst.  The abdomen was evaluated and excellent hemostasis was noted.  Under direct visualization TAP block was completed under direct visualization using 10cc of 0.25% marcaine  in each of four locations.  The umbilical incision was then extended to allow for removal of the Endo Catch bag in its entirety.  The abdomen was reinsufflated, excellent hemostasis was confirmed.  The instruments were then removed from the patients abdomen with air allowed to fully escape.  The fascial umbilical incision was then closed using 0 Vicryl on a UR needle under direct visualization.  All ports were closed with monocryl and dermabond.  The manipulator was removed from the cervix with no lacerations or bleeding identified. The patient tolerated the procedure well with all sponge, lap, and needle counts correct. The patient was taken to recovery  in stable condition.   Gina Salman, DO Attending Obstetrician & Gynecologist, [facility] for [employer], [facility]

## 2024-02-03 NOTE — Transfer of Care (Signed)
 Immediate Anesthesia Transfer of Care Note  Patient: Gina Alvarez  Procedure(s) Performed: SALPINGO-OOPHORECTOMY, BILATERAL, ROBOT ASSISTED, LAPAROSCOPIC, D5 (Bilateral: Abdomen)  Patient Location: PACU  Anesthesia Type:General  Level of Consciousness: awake and patient cooperative  Airway & Oxygen Therapy: Patient Spontanous Breathing  Post-op Assessment: Report given to RN and Post -op Vital signs reviewed and stable  Post vital signs: Reviewed and stable  Last Vitals:  Vitals Value Taken Time  BP 108/63 02/03/24  0942  Temp 97.8 02/03/24   0942  Pulse 92 02/03/24  0942  Resp 14 02/03/24  0942  SpO2 98 02/03/24  0942    Last Pain:  Vitals:   02/03/24 0710  TempSrc:   PainSc: 0-No pain      Patients Stated Pain Goal: 5 (02/03/24 0705)  Complications: No notable events documented.

## 2024-02-04 ENCOUNTER — Ambulatory Visit: Payer: Self-pay | Admitting: Obstetrics & Gynecology

## 2024-02-04 ENCOUNTER — Other Ambulatory Visit: Payer: Self-pay | Admitting: Family Medicine

## 2024-02-04 ENCOUNTER — Encounter (HOSPITAL_COMMUNITY): Payer: Self-pay | Admitting: Obstetrics & Gynecology

## 2024-02-04 DIAGNOSIS — J3489 Other specified disorders of nose and nasal sinuses: Secondary | ICD-10-CM

## 2024-02-04 LAB — SURGICAL PATHOLOGY

## 2024-02-05 LAB — CYTOLOGY - NON PAP

## 2024-02-11 ENCOUNTER — Encounter: Admitting: Obstetrics & Gynecology

## 2024-02-11 LAB — GENECONNECT MOLECULAR SCREEN: Genetic Analysis Overall Interpretation: POSITIVE — AB

## 2024-02-13 ENCOUNTER — Telehealth: Payer: Self-pay | Admitting: Medical Genetics

## 2024-02-13 DIAGNOSIS — Z1501 Genetic susceptibility to malignant neoplasm of breast: Secondary | ICD-10-CM

## 2024-02-13 NOTE — Telephone Encounter (Signed)
 [place] Gene[employer] Positive Result Note 02/13/2024 7:19 PM  FIRST ATTEMPT: Confirmed I was speaking with Gina Alvarez [phone] by using name and DOB. Informed participant the reason for this call is to provide results for the above study. Results revealed Hereditary Breast and Ovarian Syndrome. Genetic counseling was offered and participant declined as this is a known diagnosis and she had genetic counseling previously. All questions were answered, and participant was thanked for their time and support of the above study. Participant was encouraged to contact [facility] if they have any further questions or concerns.

## 2024-02-15 ENCOUNTER — Telehealth: Payer: Self-pay | Admitting: Family Medicine

## 2024-02-16 ENCOUNTER — Encounter: Admitting: Family Medicine

## 2024-02-16 ENCOUNTER — Ambulatory Visit (INDEPENDENT_AMBULATORY_CARE_PROVIDER_SITE_OTHER): Admitting: Professional Counselor

## 2024-02-16 DIAGNOSIS — F53 Postpartum depression: Secondary | ICD-10-CM | POA: Diagnosis not present

## 2024-02-16 NOTE — Telephone Encounter (Signed)
 Contacted patient. Scheduled appointment 09/26 10 am

## 2024-02-16 NOTE — Patient Instructions (Signed)
 If your symptoms worsen or you have thoughts of suicide/homicide, PLEASE SEEK IMMEDIATE MEDICAL ATTENTION.  You may always call:   National Suicide Hotline: 988 or 539 667 3577 [place] Crisis Line: 458 569 7915 Crisis Recovery in [place]: [phone]     These are available 24 hours a day, 7 days a week.
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
## 2024-02-19 ENCOUNTER — Encounter: Admitting: Nurse Practitioner

## 2024-02-19 ENCOUNTER — Ambulatory Visit: Admitting: Family Medicine

## 2024-02-19 ENCOUNTER — Encounter: Payer: Self-pay | Admitting: Family Medicine

## 2024-02-19 VITALS — BP 98/70 | HR 90 | Temp 98.2°F | Ht 60.0 in | Wt 147.5 lb

## 2024-02-19 DIAGNOSIS — E663 Overweight: Secondary | ICD-10-CM | POA: Diagnosis not present

## 2024-02-19 DIAGNOSIS — Z23 Encounter for immunization: Secondary | ICD-10-CM | POA: Diagnosis not present

## 2024-02-19 DIAGNOSIS — R739 Hyperglycemia, unspecified: Secondary | ICD-10-CM

## 2024-02-19 DIAGNOSIS — R233 Spontaneous ecchymoses: Secondary | ICD-10-CM | POA: Diagnosis not present

## 2024-02-19 DIAGNOSIS — Z0001 Encounter for general adult medical examination with abnormal findings: Secondary | ICD-10-CM | POA: Diagnosis not present

## 2024-02-19 DIAGNOSIS — Z789 Other specified health status: Secondary | ICD-10-CM | POA: Diagnosis not present

## 2024-02-19 DIAGNOSIS — F53 Postpartum depression: Secondary | ICD-10-CM

## 2024-02-19 DIAGNOSIS — Z0000 Encounter for general adult medical examination without abnormal findings: Secondary | ICD-10-CM

## 2024-02-19 DIAGNOSIS — E039 Hypothyroidism, unspecified: Secondary | ICD-10-CM

## 2024-02-19 LAB — BAYER DCA HB A1C WAIVED: HB A1C (BAYER DCA - WAIVED): 4.9 % (ref 4.8–5.6)

## 2024-02-19 NOTE — Patient Instructions (Signed)

## 2024-02-19 NOTE — Progress Notes (Signed)
 Gina Alvarez is a 33 y.o. female presents to office today for annual physical exam examination.    She reports that she is doing really well.  Postpartum depression has been well-controlled with Pristiq  and she has been having an excellent response to therapy sessions which are occurring every 2 weeks.  They apparently diagnosed her with PTSD.  She does report easy bruising with the Pristiq  but no abnormal bleeding.  She is now status post bilateral mastectomy and hysterectomy.  Seeing Dr. Ozan, who has discussed possible hormone replacement.  She is not quite sure if she wants to proceed with this  She has already started school and needs some paperwork completed for them with regards to her vaccines/titers.  Occupation: Consulting civil engineer, Substance use: none Health Maintenance Due  Topic Date Due   Hepatitis B Vaccines 19-59 Average Risk (3 of 3 - 3-dose series) 08/01/1991   COVID-19 Vaccine (1) Never done   HPV VACCINES (1 - Risk 3-dose SCDM series) Never done    Immunization History  Administered Date(s) Administered   DTP 01/17/1991   DTaP 01/17/1991   HIB (PRP-OMP) 01/17/1991   Hepatitis B 03/21/1991, 06/06/1991   INFLUENZA, HIGH DOSE SEASONAL PF 04/04/2022   IPV 01/17/1991   Influenza, Seasonal, Injecte, Preservative Fre 04/14/2023   Influenza,inj,Quad PF,6+ Mos 03/11/2017, 03/15/2019, 03/22/2021   Influenza-Unspecified 03/13/2018   OPV 01/17/1991   Td 01/23/2011   Td (Adult), 2 Lf Tetanus Toxid, Preservative Free 01/23/2011   Tdap 01/23/2011, 08/25/2015, 05/17/2017, 07/02/2023   Past Medical History:  Diagnosis Date   Anxiety    Calculus of gallbladder without cholecystitis without obstruction 08/30/2020   Depression    Encounter for supervision of normal pregnancy, antepartum 03/11/2023              NURSING     PROVIDER      Office Location    [facility]    Dating by    LMP c/w U/S at 10 wks      [facility]    Traditional    Anatomy U/S    Normal female '[place]ennox'       Initiated care at     [facility]     English                     LAB RESULTS       Support Person         Genetics    NIPS: LR female  AFP:                 NT/IT (FT only)    neg                 Irregular menstrual bleeding 09/26/2013   Normal labor 09/16/2023   Urinary retention 06/14/2012   Due to perineal swelling post laceration with childbirth/delivery     Social History   Socioeconomic History   Marital status: Significant Other    Spouse name: Not on file   Number of children: Not on file   Years of education: Not on file   Highest education level: Some college, no degree  Occupational History   Not on file  Tobacco Use   Smoking status: Former    Current packs/day: 0.00    Average packs/day: 0.5 packs/day for 8.0 years (4.0 ttl  pk-yrs)    Types: Cigarettes    Start date: 12/26/2009    Quit date: 12/26/2017    Years since quitting: 6.1   Smokeless tobacco: Never  Vaping Use   Vaping status: Never Used  Substance and Sexual Activity   Alcohol use: Never    Comment: maybe 1 drink per month   Drug use: Never   Sexual activity: Yes    Partners: Male    Birth control/protection: Pill  Other Topics Concern   Not on file  Social History Narrative   Lives with boyfriend.  3 children.    Social Drivers of Corporate investment banker Strain: Low Risk  (01/18/2024)   Overall Financial Resource Strain (CARDIA)    Difficulty of Paying Living Expenses: Not hard at all  Food Insecurity: No Food Insecurity (01/18/2024)   Hunger Vital Sign    Worried About Running Out of Food in the Last Year: Never true    Ran Out of Food in the Last Year: Never true  Transportation Needs: No Transportation Needs (01/18/2024)   PRAPARE - Administrator, Civil Service (Medical): No    Lack of Transportation (Non-Medical): No  Physical Activity: Inactive (01/18/2024)   Exercise Vital Sign    Days of Exercise per Week: 0 days    Minutes of Exercise per  Session: Not on file  Stress: Stress Concern Present (01/18/2024)   [employer] of Occupational Health - Occupational Stress Questionnaire    Feeling of Stress: To some extent  Social Connections: Socially Integrated (01/18/2024)   Social Connection and Isolation Panel    Frequency of Communication with Friends and Family: More than three times a week    Frequency of Social Gatherings with Friends and Family: Once a week    Attends Religious Services: 1 to 4 times per year    Active Member of [employer] or Organizations: Yes    Attends Engineer, structural: More than 4 times per year    Marital Status: Living with partner  Intimate Partner Violence: Not At Risk (09/16/2023)   Humiliation, Afraid, Rape, and Kick questionnaire    Fear of Current or Ex-Partner: No    Emotionally Abused: No    Physically Abused: No    Sexually Abused: No   Past Surgical History:  Procedure Laterality Date   BREAST RECONSTRUCTION WITH PLACEMENT OF TISSUE EXPANDER AND FLEX HD (ACELLULAR HYDRATED DERMIS) Bilateral 01/09/2022   Procedure: BILATERAL BREAST RECONSTRUCTION WITH PLACEMENT OF TISSUE EXPANDER AND FLEX HD (ACELLULAR HYDRATED DERMIS);  Surgeon: Lowery Estefana RAMAN, DO;  Location: [facility];  Service: Plastics;  Laterality: Bilateral;   CHOLECYSTECTOMY N/A 09/14/2020   Procedure: LAPAROSCOPIC CHOLECYSTECTOMY;  Surgeon: Kallie Manuelita BROCKS, MD;  Location: [facility];  Service: General;  Laterality: N/A;   EXTRACORPOREAL SHOCK WAVE LITHOTRIPSY Right 06/07/2020   Procedure: EXTRACORPOREAL SHOCK WAVE LITHOTRIPSY (ESWL);  Surgeon: Devere Lonni Righter, MD;  Location[facility];  Service: Urology;  Laterality: Right;   I & D EXTREMITY Right 02/14/2020   Procedure: IRRIGATION AND DEBRIDEMENT RIGHT RING FINGER WITH NAILBED REPAIR;  Surgeon: Murrell Drivers, MD;  Location: [facility];  Service: Orthopedics;  Laterality: Right;   NO PAST SURGERIES     PERCUTANEOUS PINNING Right  02/14/2020   Procedure: PERCUTANEOUS PINNING EXTREMITY;  Surgeon: Murrell Drivers, MD;  Location: [facility];  Service: Orthopedics;  Laterality: Right;   REMOVAL OF TISSUE EXPANDER AND PLACEMENT OF IMPLANT Bilateral 05/28/2022   Procedure: REMOVAL OF TISSUE  EXPANDER AND PLACEMENT OF IMPLANT;  Surgeon: Lowery Estefana RAMAN, DO;  Location: [facility];  Service: Plastics;  Laterality: Bilateral;   SALPINGO-OOPHORECTOMY, BILATERAL, ROBOT ASSISTED, LAPAROSCOPIC Bilateral 02/03/2024   Procedure: SALPINGO-OOPHORECTOMY, BILATERAL, ROBOT ASSISTED, LAPAROSCOPIC, D5;  Surgeon: Ozan, Jennifer, DO;  Location: [facility];  Service: Gynecology;  Laterality: Bilateral;   SIMPLE MASTECTOMY WITH AXILLARY SENTINEL NODE BIOPSY Bilateral 01/09/2022   Procedure: BILATERAL TOTAL MASTECTOMY;  Surgeon: Vernetta Berg, MD;  Location: [facility];  Service: General;  Laterality: Bilateral;  LMA   Family History  Problem Relation Age of Onset   Breast cancer Mother        dx 43-44; BRCA2+; stage III w/ chemo and radiation   Cancer Mother    Asthma Daughter    Lupus Paternal Grandmother        d. 26   Skin cancer Maternal Grandmother        skin discoloration on arms - not spotty   Mental illness Maternal Aunt     Current Outpatient Medications:    acetaminophen  (TYLENOL ) 325 MG tablet, Take 2 tablets (650 mg total) by mouth every 6 (six) hours as needed for mild pain (pain score 1-3)., Disp: , Rfl:    desvenlafaxine  (PRISTIQ ) 100 MG 24 hr tablet, Take 1 tablet (100 mg total) by mouth daily. Dose change, Disp: 90 tablet, Rfl: 3   hydrOXYzine  (ATARAX ) 25 MG tablet, Take 0.5-1 tablets (12.5-25 mg total) by mouth 3 (three) times daily as needed for anxiety., Disp: 30 tablet, Rfl: 1   ibuprofen  (ADVIL ) 600 MG tablet, Take 1 tablet (600 mg total) by mouth every 6 (six) hours as needed., Disp: 30 tablet, Rfl: 0   levocetirizine (XYZAL ) 5 MG tablet, TAKE ONE TABLET BY MOUTH EVERY DAY, Disp: 90 tablet,  Rfl: 1   ondansetron  (ZOFRAN ) 4 MG tablet, Take 1 tablet (4 mg total) by mouth every 8 (eight) hours as needed for nausea or vomiting., Disp: 20 tablet, Rfl: 0  No Known Allergies   ROS: Review of Systems Pertinent items noted in HPI and remainder of comprehensive ROS otherwise negative.    Physical exam BP 98/70   Pulse 90   Temp 98.2 F (36.8 C)   Ht 5' (1.524 m)   Wt 147 lb 8 oz (66.9 kg)   LMP 01/01/2024   SpO2 96%   BMI 28.81 kg/m  General appearance: alert, cooperative, appears stated age, and no distress Head: Normocephalic, without obvious abnormality, atraumatic Eyes: negative findings: lids and lashes normal, conjunctivae and sclerae normal, corneas clear, and pupils equal, round, reactive to light and accomodation Ears: normal TM's and external ear canals both ears Nose: Nares normal. Septum midline. Mucosa normal. No drainage or sinus tenderness. Throat: lips, mucosa, and tongue normal; teeth and gums normal Neck: no adenopathy, supple, symmetrical, trachea midline, and thyroid  not enlarged, symmetric, no tenderness/mass/nodules Back: symmetric, no curvature. ROM normal. No CVA tenderness. Lungs: clear to auscultation bilaterally Heart: regular rate and rhythm, S1, S2 normal, no murmur, click, rub or gallop Abdomen: soft, non-tender; bowel sounds normal; no masses,  no organomegaly Extremities: extremities normal, atraumatic, no cyanosis or edema Pulses: 2+ and symmetric Skin: ecchymosis on right forearm Lymph nodes: Cervical, supraclavicular, and axillary nodes normal. Neurologic: Grossly normal      02/16/2024    9:26 AM 01/19/2024   10:16 AM 12/23/2023    3:23 PM  Depression screen PHQ 2/9  Decreased Interest 2 1 2   Down, Depressed, Hopeless 1 1 3  PHQ - 2 Score 3 2 5   Altered sleeping 0 0 2  Tired, decreased energy 1 1 1   Change in appetite 3 2 0  Feeling bad or failure about yourself  2 1 2   Trouble concentrating 0 0 2  Moving slowly or  fidgety/restless 0 0 0  Suicidal thoughts 0 0 0  PHQ-9 Score 9 6 12   Difficult doing work/chores Very difficult Somewhat difficult Somewhat difficult      02/16/2024    9:26 AM 01/19/2024   10:17 AM 12/23/2023    3:22 PM 10/21/2023   10:43 AM  GAD 7 : Generalized Anxiety Score  Nervous, Anxious, on Edge 1 2 3  0  Control/stop worrying 1 1 3  0  Worry too much - different things 3 1 2  0  Trouble relaxing 2 0 2 0  Restless 0 0 2 0  Easily annoyed or irritable 3 3 2  0  Afraid - awful might happen 0 0 2 0  Total GAD 7 Score 10 7 16  0  Anxiety Difficulty Somewhat difficult Somewhat difficult Somewhat difficult Not difficult at all    Recent Results (from the past 2160 hours)  TSH + free T4     Status: Abnormal   Collection Time: 01/19/24 10:44 AM  Result Value Ref Range   TSH 0.424 (L) 0.450 - 4.500 uIU/mL   Free T4 0.92 0.82 - 1.77 ng/dL  QuantiFERON-TB Gold Plus     Status: None   Collection Time: 01/19/24 10:45 AM  Result Value Ref Range   QuantiFERON Incubation Incubation performed.    QuantiFERON Criteria Comment     Comment: QuantiFERON-TB Gold Plus is a qualitative indirect test for M tuberculosis infection (including disease) and is intended for use in conjunction with risk assessment, radiography, and other medical and diagnostic evaluations. The QuantiFERON-TB Gold Plus result is determined by subtracting the Nil value from either TB antigen (Ag) value. The Mitogen tube serves as a control for the test.    QuantiFERON TB1 Ag Value 0.08 IU/mL   QuantiFERON TB2 Ag Value 0.08 IU/mL   QuantiFERON Nil Value 0.08 IU/mL   QuantiFERON Mitogen Value >10.00 IU/mL   QuantiFERON-TB Gold Plus Negative Negative    Comment: No response to M tuberculosis antigens detected. Infection with M tuberculosis is unlikely, but high risk individuals should be considered for additional testing (ATS/IDSA/CDC Clinical Practice Guidelines, 2017). The reference range is an Antigen minus Nil result  of <0.35 IU/mL. Chemiluminescence immunoassay methodology   CBC     Status: Abnormal   Collection Time: 02/01/24  1:56 PM  Result Value Ref Range   WBC 7.0 4.0 - 10.5 K/uL   RBC 3.97 3.87 - 5.11 MIL/uL   Hemoglobin 12.0 12.0 - 15.0 g/dL   HCT 65.5 (L) 63.9 - 53.9 %   MCV 86.6 80.0 - 100.0 fL   MCH 30.2 26.0 - 34.0 pg   MCHC 34.9 30.0 - 36.0 g/dL   RDW 87.6 88.4 - 84.4 %   Platelets 350 150 - 400 K/uL   nRBC 0.0 0.0 - 0.2 %    Comment: Performed at [facility], [address], [place], [place] [address]  Type and screen [facility]     Status: None   Collection Time: 02/01/24  1:56 PM  Result Value Ref Range   ABO/RH(D) A POS    Antibody Screen NEG    Sample Expiration      02/15/2024,2359 Performed at [facility], [address] [place],  [place] [address]   Basic metabolic panel     Status: Abnormal   Collection Time: 02/01/24  1:56 PM  Result Value Ref Range   Sodium 136 135 - 145 mmol/L   Potassium 3.3 (L) 3.5 - 5.1 mmol/L   Chloride 105 98 - 111 mmol/L   CO2 24 22 - 32 mmol/L   Glucose, Bld 77 70 - 99 mg/dL    Comment: Glucose reference range applies only to samples taken after fasting for at least 8 hours.   BUN 7 6 - 20 mg/dL   Creatinine, Ser 9.43 0.44 - 1.00 mg/dL   Calcium 8.8 (L) 8.9 - 10.3 mg/dL   GFR, Estimated >39 >39 mL/min    Comment: (NOTE) Calculated using the CKD-EPI Creatinine Equation (2021)    Anion gap 7 5 - 15    Comment: Performed at [facility], [address], [place], [place] [address]  Pregnancy, urine     Status: None   Collection Time: 02/01/24  1:56 PM  Result Value Ref Range   Preg Test, Ur NEGATIVE NEGATIVE    Comment:        THE SENSITIVITY OF THIS METHODOLOGY IS >20 mIU/mL. Performed at [facility], [address], [place], [place] [address]   GeneConnect Molecular Screen - Blood [facility])     Status: Abnormal   Collection Time: 02/01/24  2:30 PM  Result Value Ref Range   Genetic Analysis Overall  Interpretation Positive (A)    Genetic Disease Assessed      This is a screening test and does not detect all pathogenic or likely pathogenic variant(s) in the tested genes; diagnostic testing is recommended for individuals with a personal or family history of heart disease or hereditary cancer. [facility] Tier One  Population Screen is a screening test that analyzes 11 genes related to hereditary breast and ovarian cancer (HBOC) syndrome, Lynch syndrome, and familial hypercholesterolemia. This test only reports clinically significant pathogenic and likely  pathogenic variants but does not report variants of uncertain significance (VUS). In addition, analysis of the PMS2 gene excludes exons 11-15, which overlap with a known pseudogene (PMS2CL).    Genetic Analysis Report      One Pathogenic variant was detected in the BRCA2 gene. These results indicate a predisposition to, or diagnosis of, autosomal dominant hereditary breast and ovarian cancer syndrome. These results also indicate carrier status for autosomal recessive  Fanconi anemia.The BRCA2 gene is associated with the following condition(s): - autosomal dominant hereditary breast and ovarian cancer syndrome (HBOC) (MedGen UID: [id number]) - autosomal recessive Fanconi anemia, type D1 (FA) (MedGen UID: [id number])Having one  pathogenic variant in the BRCA2 gene is associated with autosomal dominant HBOC, an adult-onset condition that causes an increased risk of certain cancers, particularly breast and ovarian in females, although affected males have cancer risks as well. -  BRCA2-associated cancer risks include: breast cancer, 44-61% lifetime risk in biological females; contralateral breast cancer (the chance of later developing breast cancer in their other breast) within 10 years after the first diagnosis,  11-35%; female  breast cancer, 4-7% risk; ovarian or fallopian tube cancer, 11-20% lifetime risk in biological females; pancreatic cancer, 4-6% lifetime risk;  prostate cancer, 28% lifetime risk in biological males; it is unclear whether lifetime risk is significantly  increased above the general population for skin cancer (melanoma).Having two pathogenic variants in BRCA2, one in each copy of the gene, is associated with autosomal recessive FA. FA is a rare, childhood-onset condition that affects  various parts of the  body with symptoms including short stature, a small head size (microcephaly), developmental delay, abnormal skin pigmentation, scoliosis, abnormally formed bones, and frequent infections due to a weakened immune system. There is also significant impact  to the bone marrow's ability to form blood cells including platelets leading to fatigue and easy bruising and bleeding in addition to an increased risk of blood cancer (acute myeloid leukemia) and malignant solid tumors  of the head and neck, skin, and  genitourinary tract.The age of onset, severity, and types of symptoms associated with BRCA2-associated conditions can vary widely, even among affected individuals from the same family.MEDICAL MANAGEMENT recommendations and/or guidelines are available for  BRCA2-related condition(s): TripleTaxes.uy, PMID: 63514842, [email or web address].pdf REFERENCES: PMID: 67323447, 66528025, 66528008, 73299880, 79795497, 84802805,  81957060, 64922779, 74150820, 71367133, 76900193, 69099689, 68504250, 74475536 Biological family members may be at risk for developing autosomal dominant hereditary breast and ovarian cancer syndrome and are at risk for, or may be carriers of, autosomal  recessive Fanconi anemia.Genetic test results should be interpreted in the context of an individual's personal medical and family history. Genetic counseling is recommended. Clinical correlation is advis ed.Additional Considerations - This is a screening  test; individuals may still carry pathogenic or  likely pathogenic variant(s) in the tested genes that are not detected by this test. - For individuals at risk for these or other related conditions based on factors including personal or family history,  diagnostic testing is recommended. - The Variant Interpretation section below may provide additional details regarding the reported variant(s). - The absence of pathogenic or likely pathogenic variant(s) in any of the other analyzed genes, while  reassuring, does not eliminate the possibility of a hereditary condition; there are other variants and genes associated with heart disease and hereditary cancer that are not included in this test.    Genes Tested See Notes     Comment: APOB, BRCA1, BRCA2, EPCAM, LDLR, LDLRAP1, PCSK9, PMS2, MLH1, MSH2, MSH6   [facility] Interpretation      This variant ([id number]) results in a frameshift, which creates a premature stop codon in the BRCA2 gene. It is predicted to result in nonsense-mediated mRNA decay or in the production of a truncated protein, leading  to loss-of-function (LOF). LOF variants in this gene are known to be deleterious (PMID: 79895415, 79698424). This variant is also known as 7172del4 and 7172delTAAA. It is a rare variant that is absent from the non-cancer cohort of the large gnomAD  population database (PMID: 67538345). This variant has been observed in individual(s) with BRCA2-related cancers (PMID: 71111458, 70374947, 64262086). Clinical laboratory interpretations available in ClinVar are in broad agreement that this variant is  pathogenic (ClinVar Variation ID: [id number]). The most relevant articles have been cited but the list is not exhaustive. In conclusion, this variant has been classified as Pathogenic.    Disclaimer See Notes     Comment: This test was developed and validated by [facility] This test has not been cleared or approved by the [place]  Food and Drug Administration (FDA). The [facility] laboratory  is accredited by the College of American Pathologists (CAP) and certified under  the Clinical Laboratory Improvement Amendments (CLIA #: [id number]) to perform high-complexity clinical tests. This test is used for clinical purposes. It should not be regarded as investigational use only or for research use only.    Sequencing Location See Notes     Comment: Sequencing done at [facility] [address], [address], [place] [address] (CLIA# [id number])  Interpretation Methods and Limitations See Notes     Comment: Extracted DNA is enriched for targeted regions and then sequenced using the [facility] Exome+ (R) assay on an Illumina DNA sequencing system. Data is then aligned to a modified version of GRCh38 and all genes are analyzed using the MANE transcript and MANE  Plus Clinical transcript, when available. Small variant calling is completed using a customized version of [employer]'s DNAseq software, augmented by a proprietary small variant caller for difficult variants. Copy number variants (CNVs) are then called  using a proprietary bioinformatics pipeline based on depth analysis with a comparison to similarly sequenced samples. Analysis of the PMS2 gene is limited to exons 1-10. The interpretation and reporting of variants in APOB, PCSK9, and LDLR is specific to  familial hypercholesterolemia; variants associated with hypobetalipoproteinemia are not included. Interpretation is based upon guidelines published by the [employer] of [employer] and Gen[employer]), the Association for Mol ecular Pathology  (AMP) or their modification by [employer] when available and/or review of previous clinical assertions available in the [employer]. Interpretation is limited to the transcripts indicated on the report and +/- 10 bp into  intronic regions, except as noted below. [facility] variant classifications include pathogenic, likely pathogenic, variant of uncertain  significance (VUS), likely benign, and benign. Only variants classified as pathogenic and likely pathogenic are included in  the report. All reported variants are confirmed through secondary manual inspection of DNA sequence data or orthogonal testing. Risk estimations and management guidelines included in this report are based on analysis of primary literature and  recommendations of applicable professional societies, and should be regarded as approximations.Based on validation studies,this assay delivers > 99% sensitivity and specificity for single nucleotide variants and insertions  and deletions (indels) up to 20  bp. Larger indels and complex variants are also reported but sensitivity may be reduced. Based on validation studies, this assay delivers > 99% sensitivity to multi-exon CNVs and > 90% sensitivity to single-exon CNVs. This test may not detect variants  in challenging regions (such as short tandem repeats, homopolymer runs, and segment duplications), sub-exonic CNVs, chromosomal aneuploidy, or variants in the presence of mosaicism. Phasing will be attempted and reported, when possible. Structural  rearrangements such as inversions, translocations, and gene conversions are not tested in this assay unless explicitly indicated. Additionally, deep intronic, promoter, and enhancer regions may not be covered. It is important to note that this is a  screening test and cannot detect all disease-causing variants. A negative result does not guarantee the absence of a rare, undetectable variant in the genes analyzed; consider using a diagnostic test if there is  significant personal and/or family history  of one of the conditions analyzed by this test. Any potential incidental findings outside of these genes and conditions will not be identified, nor reported. The results of a genetic test may be influenced by various factors, including bone marrow  transplantation, blood transfusions, or in rare cases,  hematolymphoid neoplasms.Gene Specific Notes:APOB: analysis is limited to c.10580G>A and c.10579C>T; BRCA1: sequencing analysis extends to CDS +/-20 bp; BRCA2: sequencing analysis extends to CDS  +/-20 bp. EPCAM: analysis is limited to CNVof exons 8-9; LDLR: analysis includes CNV ofthe promoter; MLH1: analysis includes CNV of the promoter; PMS2: analysis is limited to exons 1-10.Donnice JINNY Kemp, PhD, [email or web address]   Cytology - Non PAP;     Status: None   Collection Time: 02/03/24  8:05 AM  Result Value Ref Range   CYTOLOGY -  NON GYN      CYTOLOGY - NON PAP CASE: [id number] PATIENT: Gina Alvarez Non-Gynecological Cytology Report     Clinical History: Z15.01-BRC2, Z15.09 Specimen Submitted:  A. PELVIC, WASHING:   FINAL MICROSCOPIC DIAGNOSIS: - No malignant cells identified - Reactive mesothelial cells present  SPECIMEN ADEQUACY: Satisfactory for evaluation  GROSS: Received is/are 40cc's of peach fluid.(ME:me) Smears: 0 Concentration Method (ThinPrep): 1 Cell Block: Cell block attempted, but not obtained. Additional Studies: N/A     Final Diagnosis performed by Katrine Muskrat, MD.   Electronically signed 02/05/2024 Technical component performed at [facility], [address] [place], [place] [address].  Professional component performed at [facility], [address], [place], [place] [address].  Immunohistochemistry Technical component (if applicable) was performed at [facility]. [address], [address], [place],  [place] [address].   IMMUNOHISTOCHEMISTRY DISCLAIMER (if applicable): Some of these immunohistochemical stains may have been developed and the performance characteristics determine by [facility]. Some may not have been cleared or approved by the U.S. Food and Drug Administration. The FDA has determined that such clearance or approval is not necessary. This test is used  for clinical purposes. It should not be regarded as investigational or for research. This laboratory is certified under the Clinical Laboratory Improvement Amendments of 1988 (CLIA-88) as qualified to perform high complexity clinical laboratory testing.  The controls stained appropriately.   IHC stains are performed on formalin fixed, paraffin embedded tissue using a 3,3diaminobenzidine (DAB) chromogen and Leica Bond Autostainer System. The staining intensity of the nucleus is score manually and is reported as the percentage of tumor cell nuclei demonstrating specific nuclear staining. The specimens  are fixed in 10% Neutral Formalin for at least 6 hours and up to 72hrs. These tests are validated on decalcified tissue. Results should be interpreted with caution given the possibility of false negative results on decalcified specimens. Antibody Clones are as follows ER-clone 65F, PR-clone 16, Ki67- clone MM1. Some of these immunohistochemical stains may have been developed and the performance characteristics determined by [facility].   Surgical pathology     Status: None   Collection Time: 02/03/24  8:28 AM  Result Value Ref Range   SURGICAL PATHOLOGY      SURGICAL PATHOLOGY CASE: [id number] PATIENT: Gina Alvarez Surgical Pathology Report     Clinical History: Z15.01-BRC2, Z15.09 (kc)     FINAL MICROSCOPIC DIAGNOSIS:  A. FALLOPIAN TUBE, RIGHT, OVARY, SALPINGO, OOPHORECTOMY: - Benign unremarkable ovary and fallopian tube - No evidence of malignancy - See comment  B. FALLOPIAN TUBE, LEFT, OVARY, SALPINGO, OOPHORECTOMY: - Benign unremarkable ovary with benign cystic follicles - Segment of benign unremarkable fallopian tube - No evidence of malignancy - See comment     COMMENT:  A and B.  Please note that the entire specimen was submitted for histopathologic evaluation    GROSS DESCRIPTION:  A.  Received in formalin is a 11 g adnexa consisting of a  3.0 x 2.2 x 2.0 cm ovary with attached 4.0 cm long by 0.6 cm diameter fimbriated fallopian tube.  The serosal surface of the ovary is tan-pink and smooth with 2 areas of disruption measuring up to 1.4 cm in greatest dimension. Sectioning reveals 2 hemo rrhagic corpus luteum measuring up to 1.5 cm in greatest dimension and 2 simple cysts measuring 0.5 cm in greatest dimension.  The remainder of the ovary is unremarkable.  The fallopian tube is serially sectioned and appears grossly unremarkable.  The ovary and fallopian tube are entirely submitted as follows: A1-6, ovary. A7-A8, fallopian tube.  B.  Received in formalin is a 8.0 g adnexa consisting of a 2.5 x 1.7 x 1.5 cm ovary and separate 4.5 cm long by 0.5 cm diameter fimbriated fallopian tube.  The serosal surface of the ovary is tan-pink and lobulated.  The ovary is sectioned to reveal 2 simple cysts measuring up to 1.0 cm in greatest dimension.  The cysts contain clear watery fluid and have no excrescences.  The remainder of the ovary is unremarkable. The fallopian tube is serially sectioned and appears grossly unremarkable.  The ovary and fallopian tube are entirely submitted as follows: B1-4, ovary.  B5-7, fallopian tube. (WC 02/03/2024)   Final Diagnosis per formed by Katrine Muskrat, MD.   Electronically signed 02/04/2024 Technical component performed at [facility], [address], [place], [place] [address].  Professional component performed at [facility], [address], [place], [place] [address].  Immunohistochemistry Technical component (if applicable) was performed at [facility]. [address], [place], [place] [address].   IMMUNOHISTOCHEMISTRY DISCLAIMER (if applicable): Some of these immunohistochemical stains may have been developed and the performance characteristics determine by [facility]. Some may not have been cleared or  approved by the U.S. Food and Drug Administration. The FDA has determined that such clearance or approval is not necessary. This test is used for clinical purposes. It should not be regarded as investigational or for research. This laboratory is certified under the Clinical Laboratory Improvement Amendments of  1988 (CLIA-88) as qualified to perform high complexity clinical laboratory testing.  The controls stained appropriately.   IHC stains are performed on formalin fixed, paraffin embedded tissue using a 3,3diaminobenzidine (DAB) chromogen and Leica Bond Autostainer System. The staining intensity of the nucleus is score manually and is reported as the percentage of tumor cell nuclei demonstrating specific nuclear staining. The specimens are fixed in 10% Neutral Formalin for at least 6 hours and up to 72hrs. These tests are validated on decalcified tissue. Results should be interpreted with caution given the possibility of false negative results on decalcified specimens. Antibody Clones are as follows ER-clone 41F, PR-clone 16, Ki67- clone MM1. Some of these immunohistochemical stains may have been developed and the performance characteristics determined by [facility].      Assessment/ Plan: Josette GORMAN [place] here for annual physical exam.   Annual physical exam  Acquired hypothyroidism - Plan: TSH + free T4, CMP14+EGFR  Postpartum depression - Plan: CMP14+EGFR  Overweight (BMI 25.0-29.9) - Plan: CMP14+EGFR, Lipid Panel  Elevated serum glucose - Plan: CMP14+EGFR, Bayer DCA Hb A1c Waived  Unknown varicella vaccination status - Plan: Varicella zoster antibody, IgG  Hepatitis B vaccination status unknown - Plan: Hepatitis B surface antibody,quantitative  Measles, mumps, rubella (MMR) vaccination status unknown - Plan: Measles/Mumps/Rubella Immunity  Easy bruisability - Plan: CBC with Differential  Immunization due - Plan: Tdap vaccine greater than or equal to 7yo  IM  Encounter for immunization - Plan: Flu vaccine trivalent PF, 6mos and older(Flulaval,Afluria,Fluarix,Fluzone)   Check thyroid  levels, color straw, A1c given elevated serum glucose noted previously. Check titers for school and her form was completed.  We will fill in the titer information once this is available and call patient for pickup  PP depression controlled steak which I think is likely what is causing the ecchymosis she has been seeing in her upper extremities  Both tetanus and influenza vaccinations  administered today.   Counseled on healthy lifestyle choices, including diet (rich in fruits, vegetables and lean meats and low in salt and simple carbohydrates) and exercise (at least 30 minutes of moderate physical activity daily).  Patient to follow up 6-12 months  Arnisha Laffoon M. Jolinda, DO

## 2024-02-20 LAB — CMP14+EGFR
ALT: 12 IU/L (ref 0–32)
AST: 14 IU/L (ref 0–40)
Albumin: 4.7 g/dL (ref 3.9–4.9)
Alkaline Phosphatase: 93 IU/L (ref 41–116)
BUN/Creatinine Ratio: 14 (ref 9–23)
BUN: 10 mg/dL (ref 6–20)
Bilirubin Total: 0.2 mg/dL (ref 0.0–1.2)
CO2: 21 mmol/L (ref 20–29)
Calcium: 9.6 mg/dL (ref 8.7–10.2)
Chloride: 101 mmol/L (ref 96–106)
Creatinine, Ser: 0.69 mg/dL (ref 0.57–1.00)
Globulin, Total: 2.4 g/dL (ref 1.5–4.5)
Glucose: 89 mg/dL (ref 70–99)
Potassium: 4.5 mmol/L (ref 3.5–5.2)
Sodium: 138 mmol/L (ref 134–144)
Total Protein: 7.1 g/dL (ref 6.0–8.5)
eGFR: 117 mL/min/1.73 (ref >59)

## 2024-02-20 LAB — TSH+FREE T4
Free T4: 0.97 ng/dL (ref 0.82–1.77)
TSH: 0.462 u[IU]/mL (ref 0.450–4.500)

## 2024-02-20 LAB — MEASLES/MUMPS/RUBELLA IMMUNITY
MUMPS ABS, IGG: 67.5 [AU]/ml (ref >10.9)
RUBEOLA AB, IGG: >300 [AU]/ml (ref >16.4)
Rubella Antibodies, IGG: 1.18 {index} (ref >0.99)

## 2024-02-20 LAB — CBC WITH DIFFERENTIAL/PLATELET
Basophils Absolute: 0 x10E3/uL (ref 0.0–0.2)
Basos: 0 %
EOS (ABSOLUTE): 0 x10E3/uL (ref 0.0–0.4)
Eos: 0 %
Hematocrit: 38.3 % (ref 34.0–46.6)
Hemoglobin: 12.6 g/dL (ref 11.1–15.9)
Immature Grans (Abs): 0 x10E3/uL (ref 0.0–0.1)
Immature Granulocytes: 0 %
Lymphocytes Absolute: 2 x10E3/uL (ref 0.7–3.1)
Lymphs: 26 %
MCH: 30.4 pg (ref 26.6–33.0)
MCHC: 32.9 g/dL (ref 31.5–35.7)
MCV: 92 fL (ref 79–97)
Monocytes Absolute: 0.4 x10E3/uL (ref 0.1–0.9)
Monocytes: 5 %
Neutrophils Absolute: 5.2 x10E3/uL (ref 1.4–7.0)
Neutrophils: 69 %
Platelets: 316 x10E3/uL (ref 150–450)
RBC: 4.15 x10E6/uL (ref 3.77–5.28)
RDW: 13.5 % (ref 11.7–15.4)
WBC: 7.6 x10E3/uL (ref 3.4–10.8)

## 2024-02-20 LAB — LIPID PANEL
Chol/HDL Ratio: 4.3 ratio (ref 0.0–4.4)
Cholesterol, Total: 185 mg/dL (ref 100–199)
HDL: 43 mg/dL (ref >39)
LDL Chol Calc (NIH): 129 mg/dL — ABNORMAL HIGH (ref 0–99)
Triglycerides: 72 mg/dL (ref 0–149)
VLDL Cholesterol Cal: 13 mg/dL (ref 5–40)

## 2024-02-20 LAB — HEPATITIS B SURFACE ANTIBODY, QUANTITATIVE: Hepatitis B Surf Ab Quant: 6597 m[IU]/mL

## 2024-02-20 LAB — VARICELLA ZOSTER ANTIBODY, IGG: Varicella zoster IgG: REACTIVE

## 2024-02-22 ENCOUNTER — Ambulatory Visit: Payer: Self-pay | Admitting: Family Medicine

## 2024-02-25 ENCOUNTER — Telehealth (INDEPENDENT_AMBULATORY_CARE_PROVIDER_SITE_OTHER): Payer: Self-pay | Admitting: Professional Counselor

## 2024-02-25 DIAGNOSIS — F53 Postpartum depression: Secondary | ICD-10-CM

## 2024-02-25 NOTE — BH Specialist Note (Unsigned)
 Virtual Behavioral Health Treatment Plan Team Note  MRN: [id number] NAME: Gina Alvarez  DATE: 02/25/24  Start time: Start Time: 1019 End time:   Total time:    Total number of Armed forces technical officer Plan encounters: 1/4  Treatment Team Attendees:   Diagnoses:    ICD-10-CM   1. Postpartum depression  F53.0       Goals, Interventions and Follow-up Plan Goals: Learn coping skills to manage anxiety and stress Interventions: Mindfulness or Relaxation Training CBT Cognitive Behavioral Therapy Medication Monitoring Medication Management Recommendations: Continue current regimen Follow-up Plan:    History of the present illness Presenting Problem/Current Symptoms:   Psychiatric History  Depression: {BHH YES OR NO:22294} Anxiety: {BHH YES OR NO:22294} Mania: {BHH YES OR NO:22294} Psychosis: {BHH YES OR NO:22294} PTSD symptoms: {BHH YES OR NO:22294}  Past Psychiatric History/Hospitalization(s): Hospitalization for psychiatric illness: {BHH YES OR NO:22294} Prior Suicide Attempts: {BHH YES OR NO:22294} Prior Self-injurious behavior: {BHH YES OR WN:77705}  Psychosocial stressors   Self-harm Behaviors Risk Assessment   Screenings PHQ-9 Assessments:     02/19/2024    1:25 PM 02/16/2024    9:26 AM 01/19/2024   10:16 AM  Depression screen PHQ 2/9  Decreased Interest 0 2 1  Down, Depressed, Hopeless 0 1 1  PHQ - 2 Score 0 3 2  Altered sleeping 0 0 0  Tired, decreased energy 0 1 1  Change in appetite 3 3 2   Feeling bad or failure about yourself  0 2 1  Trouble concentrating 0 0 0  Moving slowly or fidgety/restless 0 0 0  Suicidal thoughts 0 0 0  PHQ-9 Score 3 9 6   Difficult doing work/chores Somewhat difficult Very difficult Somewhat difficult   GAD-7 Assessments:     02/19/2024    1:26 PM 02/16/2024    9:26 AM 01/19/2024   10:17 AM 12/23/2023    3:22 PM  GAD 7 : Generalized Anxiety Score  Nervous, Anxious, on Edge 1 1 2 3   Control/stop worrying 0 1 1 3    Worry too much - different things 0 3 1 2   Trouble relaxing 0 2 0 2  Restless 0 0 0 2  Easily annoyed or irritable 1 3 3 2   Afraid - awful might happen 0 0 0 2  Total GAD 7 Score 2 10 7 16   Anxiety Difficulty Not difficult at all Somewhat difficult Somewhat difficult Somewhat difficult    Past Medical History Past Medical History:  Diagnosis Date   Anxiety    Calculus of gallbladder without cholecystitis without obstruction 08/30/2020   Depression    Encounter for supervision of normal pregnancy, antepartum 03/11/2023              NURSING     PROVIDER      Office Location    [facility]    Dating by    LMP c/w U/S at 10 wks      [facility] Model    Traditional    Anatomy U/S    Normal female '[place]'      Initiated care at     [employer] [facility]     English                     LAB RESULTS       Support Person  Genetics    NIPS: LR female  AFP:                 NT/IT (FT only)    neg                 Irregular menstrual bleeding 09/26/2013   Normal labor 09/16/2023   Urinary retention 06/14/2012   Due to perineal swelling post laceration with childbirth/delivery      Vital signs: There were no vitals filed for this visit.  Allergies:  Allergies as of 02/25/2024   (No Known Allergies)    Medication History Current medications:  Outpatient Encounter Medications as of 02/25/2024  Medication Sig   acetaminophen  (TYLENOL ) 325 MG tablet Take 2 tablets (650 mg total) by mouth every 6 (six) hours as needed for mild pain (pain score 1-3).   desvenlafaxine  (PRISTIQ ) 100 MG 24 hr tablet Take 1 tablet (100 mg total) by mouth daily. Dose change   hydrOXYzine  (ATARAX ) 25 MG tablet Take 0.5-1 tablets (12.5-25 mg total) by mouth 3 (three) times daily as needed for anxiety.   ibuprofen  (ADVIL ) 600 MG tablet Take 1 tablet (600 mg total) by mouth every 6 (six) hours as needed.   levocetirizine (XYZAL ) 5 MG tablet TAKE ONE TABLET BY MOUTH EVERY DAY   ondansetron  (ZOFRAN ) 4 MG  tablet Take 1 tablet (4 mg total) by mouth every 8 (eight) hours as needed for nausea or vomiting.   No facility-administered encounter medications on file as of 02/25/2024.     Scribe for Treatment Team: [name]

## 2024-03-01 ENCOUNTER — Ambulatory Visit: Admitting: Professional Counselor

## 2024-03-01 DIAGNOSIS — F53 Postpartum depression: Secondary | ICD-10-CM

## 2024-03-01 NOTE — BH Specialist Note (Signed)
 [facility] Follow-up  MRN: [id number] NAME: Gina Alvarez Date: 03/01/24  Start time: Start Time: 1000 End time: Stop Time: 1030 Total time: Total Time in Minutes (Visit): 30 Call number: Visit Number: 3- Third Visit  Reason for call today:   The patient is a 33 year old female presenting for a collaborative care follow-up. She reports that her overall mood has improved since the last session. The patient has been implementing several mindfulness and anger management strategies discussed previously, such as counting to ten before reacting and using communication worksheets to help her de-escalate during moments of stress. She notes that these techniques have been beneficial in helping her pause and respond more calmly rather than react impulsively.  She continues to experience typical daily stressors related to parenting and household responsibilities, which at times make her feel on edge or anxious. However, she reports feeling more in control of her reactions and describes an ongoing commitment to improving how she manages stress.  The patient also continues to utilize cognitive processing therapy (CPT) strategies, which have helped her reframe negative thoughts and reduce reactivity. She describes feeling more hopeful, less irritable, and overall more balanced. Medication remains effective and well-tolerated.  Her current screening scores are PHQ-9 = 3 and GAD-7 = 2, reflecting minimal symptoms and overall stability.  PHQ-9 Scores:     03/01/2024   10:06 AM 02/19/2024    1:25 PM 02/16/2024    9:26 AM 01/19/2024   10:16 AM 12/23/2023    3:23 PM  Depression screen PHQ 2/9  Decreased Interest 0 0 2 1 2   Down, Depressed, Hopeless 0 0 1 1 3   PHQ - 2 Score 0 0 3 2 5   Altered sleeping 1 0 0 0 2  Tired, decreased energy 0 0 1 1 1   Change in appetite 2 3 3 2  0  Feeling bad or failure about yourself  0 0 2 1 2   Trouble concentrating 0 0 0 0 2  Moving slowly or fidgety/restless 0 0 0 0 0   Suicidal thoughts 0 0 0 0 0  PHQ-9 Score 3 3 9 6 12   Difficult doing work/chores Somewhat difficult Somewhat difficult Very difficult Somewhat difficult Somewhat difficult   GAD-7 Scores:     03/01/2024   10:09 AM 02/19/2024    1:26 PM 02/16/2024    9:26 AM 01/19/2024   10:17 AM  GAD 7 : Generalized Anxiety Score  Nervous, Anxious, on Edge 1 1 1 2   Control/stop worrying 0 0 1 1  Worry too much - different things 0 0 3 1  Trouble relaxing 0 0 2 0  Restless 0 0 0 0  Easily annoyed or irritable 1 1 3 3   Afraid - awful might happen 0 0 0 0  Total GAD 7 Score 2 2 10 7   Anxiety Difficulty Not difficult at all Not difficult at all Somewhat difficult Somewhat difficult    Stress Current stressors:  Home life, kids, relationship Sleep:  Good Appetite:  Good Coping ability:  Good Patient taking medications as prescribed:  Yes  Current medications:  Outpatient Encounter Medications as of 03/01/2024  Medication Sig   acetaminophen  (TYLENOL ) 325 MG tablet Take 2 tablets (650 mg total) by mouth every 6 (six) hours as needed for mild pain (pain score 1-3).   desvenlafaxine  (PRISTIQ ) 100 MG 24 hr tablet Take 1 tablet (100 mg total) by mouth daily. Dose change   hydrOXYzine  (ATARAX ) 25 MG tablet Take 0.5-1 tablets (12.5-25 mg total) by  mouth 3 (three) times daily as needed for anxiety.   ibuprofen  (ADVIL ) 600 MG tablet Take 1 tablet (600 mg total) by mouth every 6 (six) hours as needed.   levocetirizine (XYZAL ) 5 MG tablet TAKE ONE TABLET BY MOUTH EVERY DAY   ondansetron  (ZOFRAN ) 4 MG tablet Take 1 tablet (4 mg total) by mouth every 8 (eight) hours as needed for nausea or vomiting.   No facility-administered encounter medications on file as of 03/01/2024.     Self-harm Behaviors Risk Assessment Self-harm risk factors:  Depression, stress, ptsd Patient endorses recent thoughts of harming self:  Denies   Danger to Others Risk Assessment Danger to others risk factors:  None Patient endorses  recent thoughts of harming others:  Denies    Substance Use Assessment Patient recently consumed alcohol:  None  Alcohol Use Disorder Identification Test (AUDIT):     11/12/2022    3:07 PM 12/10/2022    9:04 AM 03/17/2023   10:16 AM 01/18/2024   10:59 AM  Alcohol Use Disorder Test (AUDIT)  1. How often do you have a drink containing alcohol? 1 0 0 1  2. How many drinks containing alcohol do you have on a typical day when you are drinking? 0 0 0 0  3. How often do you have six or more drinks on one occasion? 0 0 0 0  AUDIT-C Score 1 0 0 1      Patient-reported    Goals, Interventions and Follow-up Plan Goals: Decrease stress  Interventions: CBT Cognitive Behavioral Therapy Follow-up Plan:  Continue mindfulness and anger management practices. Reinforce CPT-based cognitive restructuring techniques. Maintain current medication regimen. Follow up in two weeks to monitor progress and continue supportive interventions.     Gina Alvarez

## 2024-03-15 ENCOUNTER — Ambulatory Visit: Admitting: Professional Counselor

## 2024-03-16 ENCOUNTER — Encounter: Payer: Self-pay | Admitting: Family Medicine

## 2024-03-22 ENCOUNTER — Ambulatory Visit (INDEPENDENT_AMBULATORY_CARE_PROVIDER_SITE_OTHER): Admitting: Professional Counselor

## 2024-03-22 DIAGNOSIS — F53 Postpartum depression: Secondary | ICD-10-CM

## 2024-03-22 NOTE — BH Specialist Note (Signed)
 [facility] Follow-up  MRN: [id number] NAME: Gina Alvarez Date: 03/22/24  Start time: Start Time: 0100 End time: Stop Time: 0130 Total time: Total Time in Minutes (Visit): 30 Call number: Visit Number: 4- Fourth Visit  Reason for call today:  Patient is a 33 yo female, presented for a collaborative care follow-up session. She appeared in a relatively positive mood, reporting some improvement as she continues to navigate ongoing conflict with her husband. The patient shared that focusing more on herself and her self-care, as well as avoiding arguments, has contributed to her improved mood. She continues to balance multiple responsibilities, including managing the household, attending school, and caring for her family, which often leaves her feeling overwhelmed and questioning her sense of purpose.  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 This transition has been challenging, as she now relies on her husband financially and feels a loss of independence and control. The patient described feeling that her husband is at times controlling with money, which has added strain to the relationship.  During the session, supportive counseling and behavioral interventions were provided to help her focus on self-empowerment, emotional regulation, and coping with role adjustments. Her current medications appear to be effective, and no changes were recommended at this time. A follow-up session is scheduled in two weeks to continue monitoring her progress and provide ongoing support.  PHQ-9 Scores:     03/22/2024    1:12 PM 03/01/2024   10:06 AM 02/19/2024    1:25 PM 02/16/2024    9:26 AM 01/19/2024   10:16 AM  Depression screen PHQ 2/9  Decreased Interest 1 0 0 2 1  Down, Depressed, Hopeless 0 0 0 1 1  PHQ - 2 Score 1 0 0 3 2  Altered sleeping 0 1 0 0 0  Tired, decreased energy 0 0 0 1 1  Change in appetite  3 2 3 3 2   Feeling bad or failure about yourself  0 0 0 2 1  Trouble concentrating 0 0 0 0 0  Moving slowly or fidgety/restless 0 0 0 0 0  Suicidal thoughts 0 0 0 0 0  PHQ-9 Score 4 3 3 9 6   Difficult doing work/chores Somewhat difficult Somewhat difficult Somewhat difficult Very difficult Somewhat difficult   GAD-7 Scores:     03/22/2024    1:13 PM 03/01/2024   10:09 AM 02/19/2024    1:26 PM 02/16/2024    9:26 AM  GAD 7 : Generalized Anxiety Score  Nervous, Anxious, on Edge 0 1 1 1   Control/stop worrying 0 0 0 1  Worry too much - different things 0 0 0 3  Trouble relaxing 0 0 0 2  Restless 0 0 0 0  Easily annoyed or irritable 3 1 1 3   Afraid - awful might happen 0 0 0 0  Total GAD 7 Score 3 2 2 10   Anxiety Difficulty Not difficult at all Not difficult at all Not difficult at all Somewhat difficult    Stress Current stressors:  school, homelife Sleep:  good Appetite:  good Coping ability:  good Patient taking medications as prescribed:  yes  Current medications:  Outpatient Encounter Medications as of 03/22/2024  Medication Sig   acetaminophen  (TYLENOL ) 325 MG tablet Take 2 tablets (650 mg total) by mouth every 6 (six) hours as needed for mild pain (pain score 1-3).   desvenlafaxine  (PRISTIQ ) 100 MG 24 hr tablet Take  1 tablet (100 mg total) by mouth daily. Dose change   hydrOXYzine  (ATARAX ) 25 MG tablet Take 0.5-1 tablets (12.5-25 mg total) by mouth 3 (three) times daily as needed for anxiety.   ibuprofen  (ADVIL ) 600 MG tablet Take 1 tablet (600 mg total) by mouth every 6 (six) hours as needed.   levocetirizine (XYZAL ) 5 MG tablet TAKE ONE TABLET BY MOUTH EVERY DAY   ondansetron  (ZOFRAN ) 4 MG tablet Take 1 tablet (4 mg total) by mouth every 8 (eight) hours as needed for nausea or vomiting.   No facility-administered encounter medications on file as of 03/22/2024.     Self-harm Behaviors Risk Assessment Self-harm risk factors:  None Patient endorses recent thoughts of  harming self:  Depression   Danger to Others Risk Assessment Danger to others risk factors:  None Patient endorses recent thoughts of harming others:  Denies   Substance Use Assessment Patient recently consumed alcohol:  Denies  Alcohol Use Disorder Identification Test (AUDIT):     11/12/2022    3:07 PM 12/10/2022    9:04 AM 03/17/2023   10:16 AM 01/18/2024   10:59 AM  Alcohol Use Disorder Test (AUDIT)  1. How often do you have a drink containing alcohol? 1 0 0 1  2. How many drinks containing alcohol do you have on a typical day when you are drinking? 0 0 0 0  3. How often do you have six or more drinks on one occasion? 0 0 0 0  AUDIT-C Score 1 0 0 1      Patient-reported    Goals, Interventions and Follow-up Plan Goals: Increase healthy adjustment to current life circumstances Interventions: Medication Monitoring and Supportive Counseling Follow-up Plan: 2 weeks    Gina Alvarez

## 2024-03-29 ENCOUNTER — Ambulatory Visit: Admitting: Professional Counselor

## 2024-03-29 DIAGNOSIS — F53 Postpartum depression: Secondary | ICD-10-CM
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
## 2024-04-05 ENCOUNTER — Ambulatory Visit: Admitting: Professional Counselor

## 2024-04-05 ENCOUNTER — Ambulatory Visit: Payer: Self-pay | Admitting: Professional Counselor

## 2024-04-05 DIAGNOSIS — F53 Postpartum depression: Secondary | ICD-10-CM | POA: Diagnosis not present
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
## 2024-04-12 ENCOUNTER — Ambulatory Visit: Admitting: Plastic Surgery

## 2024-04-19 ENCOUNTER — Ambulatory Visit: Admitting: Plastic Surgery

## 2024-04-20 ENCOUNTER — Encounter: Payer: Self-pay | Admitting: Family Medicine

## 2024-04-25 ENCOUNTER — Encounter: Payer: Self-pay | Admitting: Family Medicine

## 2024-04-25 NOTE — Telephone Encounter (Signed)
 Appointment scheduled.

## 2024-04-25 NOTE — Telephone Encounter (Signed)
 Patient would like to schedule for one day next week, any day is fine as long as it is after 8am and before 2:30pm.

## 2024-05-03 ENCOUNTER — Ambulatory Visit (INDEPENDENT_AMBULATORY_CARE_PROVIDER_SITE_OTHER): Admitting: Professional Counselor

## 2024-05-03 DIAGNOSIS — F53 Postpartum depression: Secondary | ICD-10-CM

## 2024-05-03 NOTE — BH Specialist Note (Unsigned)
 [facility] Telephone Follow-up  MRN: [id number] NAME: LYLA JASEK Date: 05/03/24  Start time: Start Time: 1000 End time: Stop Time: 1030 Total time: Total Time in Minutes (Visit): 30 Call number: Visit Number: Additional Visit  Reason for call today:  rough couple of weeks living with mom, stress and overwhelmed, feeling guilt and short tempered,   PHQ-9 Scores:     05/03/2024   10:17 AM 04/05/2024   11:33 AM 03/22/2024    1:12 PM 03/01/2024   10:06 AM 02/19/2024    1:25 PM  Depression screen PHQ 2/9  Decreased Interest 1 3 1  0 0  Down, Depressed, Hopeless 0 2 0 0 0  PHQ - 2 Score 1 5 1  0 0  Altered sleeping 3 0 0 1 0  Tired, decreased energy 3 2 0 0 0  Change in appetite 3 2 3 2 3   Feeling bad or failure about yourself  0 1 0 0 0  Trouble concentrating 0 0 0 0 0  Moving slowly or fidgety/restless 0 0 0 0 0  Suicidal thoughts 0 0 0 0 0  PHQ-9 Score 10 10 4  3  3    Difficult doing work/chores Somewhat difficult Somewhat difficult Somewhat difficult Somewhat difficult Somewhat difficult     Data saved with a previous flowsheet row definition   GAD-7 Scores:     05/03/2024   10:18 AM 04/05/2024   11:33 AM 03/22/2024    1:13 PM 03/01/2024   10:09 AM  GAD 7 : Generalized Anxiety Score  Nervous, Anxious, on Edge 1 1 0 1  Control/stop worrying 0 1 0 0  Worry too much - different things 0 1 0 0  Trouble relaxing 3 2 0 0  Restless 3 2 0 0  Easily annoyed or irritable 3 2 3 1   Afraid - awful might happen 0 0 0 0  Total GAD 7 Score 10 9 3 2   Anxiety Difficulty Somewhat difficult Somewhat difficult Not difficult at all Not difficult at all    Stress Current stressors:  unstable living situation Sleep:  over sleeping Appetite:  good Coping ability:  fair Patient taking medications as prescribed:  yes  Current medications:  Outpatient Encounter Medications as of 05/03/2024  Medication Sig   acetaminophen  (TYLENOL ) 325 MG tablet Take 2 tablets (650 mg total)  by mouth every 6 (six) hours as needed for mild pain (pain score 1-3).   desvenlafaxine  (PRISTIQ ) 100 MG 24 hr tablet Take 1 tablet (100 mg total) by mouth daily. Dose change   hydrOXYzine  (ATARAX ) 25 MG tablet Take 0.5-1 tablets (12.5-25 mg total) by mouth 3 (three) times daily as needed for anxiety.   ibuprofen  (ADVIL ) 600 MG tablet Take 1 tablet (600 mg total) by mouth every 6 (six) hours as needed.   levocetirizine (XYZAL ) 5 MG tablet TAKE ONE TABLET BY MOUTH EVERY DAY   ondansetron  (ZOFRAN ) 4 MG tablet Take 1 tablet (4 mg total) by mouth every 8 (eight) hours as needed for nausea or vomiting.   No facility-administered encounter medications on file as of 05/03/2024.     Self-harm Behaviors Risk Assessment Self-harm risk factors:  past thoughts Patient endorses recent thoughts of harming self:  Denies  [place] Suicide Severity Rating Scale:  Psychologist, Occupational Health from 05/03/2024 in [facility] from 04/05/2024 in [facility] from 03/22/2024 in [facility]  C-SSRS RISK CATEGORY No Risk No Risk No Risk    Danger to Others Risk Assessment Danger to others risk factors: None Patient endorses recent thoughts of harming others:  Denies  Dynamic Appraisal of Situational Aggression (DASA):      No data to display           Substance Use Assessment Patient recently consumed alcohol:    Alcohol Use Disorder Identification Test (AUDIT):     11/12/2022    3:07 PM 12/10/2022    9:04 AM 03/17/2023   10:16 AM 01/18/2024   10:59 AM  Alcohol Use Disorder Test (AUDIT)  1. How often do you have a drink containing alcohol? 1 0 0 1  2. How many drinks containing alcohol do you have on a typical day when you are drinking? 0 0 0 0  3. How often do you have six or more drinks on one occasion? 0 0 0 0  AUDIT-C Score 1  0 0 1      Patient-reported   Patient recently used drugs:    Opioid Risk Assessment:    Goals, Interventions and Follow-up Plan Goals: Increase healthy adjustment to current life circumstances Interventions: CBT Cognitive Behavioral Therapy Follow-up Plan:   Summary:   [name]

## 2024-05-09 ENCOUNTER — Ambulatory Visit: Admitting: Professional Counselor

## 2024-05-09 DIAGNOSIS — F53 Postpartum depression: Secondary | ICD-10-CM | POA: Diagnosis not present

## 2024-05-16 NOTE — BH Specialist Note (Signed)
  Virtual BH Telephone Follow-up  MRN: [id number] NAME: Gina Alvarez Date: 05/16/2024  Start time: Start Time: 0900 End time: Stop Time: 0930 Total time: Total Time in Minutes (Visit): 30 Call number: Visit Number: Additional Visit  Reason for call today:  The patient is a 33 year old female who presented for a collaborative care follow-up via telehealth. She reported navigating a difficult couple of weeks, feeling stressed and overwhelmed while living with her mother. She endorsed feelings of guilt and emotional strain related to her current circumstances, including managing responsibilities with her children. The patient described being irritable and reactive at times, particularly in interactions with her ex-partner, but also expressed hopefulness about the future. She reported practicing behavioral strategies discussed in prior sessions and has been open in sharing thoughts and feelings that she might typically hold back. The patient continues to work on managing stress, improving mood, and gradually increasing social engagement. She was receptive to supportive counseling and brief interventions during the session and will continue collaborative care follow-ups until a transition to traditional therapy is appropriate.  PHQ-9 Scores:     05/03/2024   10:17 AM 04/05/2024   11:33 AM 03/22/2024    1:12 PM 03/01/2024   10:06 AM 02/19/2024    1:25 PM  Depression screen PHQ 2/9  Decreased Interest 1 3 1  0 0  Down, Depressed, Hopeless 0 2 0 0 0  PHQ - 2 Score 1 5 1  0 0  Altered sleeping 3 0 0 1 0  Tired, decreased energy 3 2 0 0 0  Change in appetite 3 2 3 2 3   Feeling bad or failure about yourself  0 1 0 0 0  Trouble concentrating 0 0 0 0 0  Moving slowly or fidgety/restless 0 0 0 0 0  Suicidal thoughts 0 0 0 0 0  PHQ-9 Score 10 10 4  3  3    Difficult doing work/chores Somewhat difficult Somewhat difficult Somewhat difficult Somewhat difficult Somewhat difficult     Data saved  with a previous flowsheet row definition   GAD-7 Scores:     05/03/2024   10:18 AM 04/05/2024   11:33 AM 03/22/2024    1:13 PM 03/01/2024   10:09 AM  GAD 7 : Generalized Anxiety Score  Nervous, Anxious, on Edge 1 1 0 1  Control/stop worrying 0 1 0 0  Worry too much - different things 0 1 0 0  Trouble relaxing 3 2 0 0  Restless 3 2 0 0  Easily annoyed or irritable 3 2 3 1   Afraid - awful might happen 0 0 0 0  Total GAD 7 Score 10 9 3 2   Anxiety Difficulty Somewhat difficult Somewhat difficult Not difficult at all Not difficult at all    Stress Current stressors:  school, coparenting Sleep:  Fair Appetite:  Fair Coping ability:  Good Patient taking medications as prescribed:  Yes  Current medications:  Outpatient Encounter Medications as of 05/09/2024  Medication Sig   acetaminophen  (TYLENOL ) 325 MG tablet Take 2 tablets (650 mg total) by mouth every 6 (six) hours as needed for mild pain (pain score 1-3).   desvenlafaxine  (PRISTIQ ) 100 MG 24 hr tablet Take 1 tablet (100 mg total) by mouth daily. Dose change   hydrOXYzine  (ATARAX ) 25 MG tablet Take 0.5-1 tablets (12.5-25 mg total) by mouth 3 (three) times daily as needed for anxiety.   ibuprofen  (ADVIL ) 600 MG tablet Take 1 tablet (600 mg total) by mouth every 6 (six) hours  as needed.   levocetirizine (XYZAL ) 5 MG tablet TAKE ONE TABLET BY MOUTH EVERY DAY   ondansetron  (ZOFRAN ) 4 MG tablet Take 1 tablet (4 mg total) by mouth every 8 (eight) hours as needed for nausea or vomiting.   No facility-administered encounter medications on file as of 05/09/2024.     Self-harm Behaviors Risk Assessment Self-harm risk factors:  Depression,  Patient endorses recent thoughts of harming self:  Denies   Danger to Others Risk Assessment Danger to others risk factors:  None reported Patient endorses recent thoughts of harming others:  Denies   Substance Use Assessment Patient recently consumed alcohol:    Alcohol Use Disorder  Identification Test (AUDIT):     11/12/2022    3:07 PM 12/10/2022    9:04 AM 03/17/2023   10:16 AM 01/18/2024   10:59 AM  Alcohol Use Disorder Test (AUDIT)  1. How often do you have a drink containing alcohol? 1  0 0 1   2. How many drinks containing alcohol do you have on a typical day when you are drinking? 0  0 0 0   3. How often do you have six or more drinks on one occasion? 0  0 0 0   AUDIT-C Score 1 0 0 1      Manually entered by patient    Goals, Interventions and Follow-up Plan Goals: Increase healthy adjustment to current life circumstances Interventions: CBT Cognitive Behavioral Therapy and Medication Monitoring Follow-up Plan: 2 weeks    [name]

## 2024-06-14 ENCOUNTER — Ambulatory Visit: Admitting: Professional Counselor

## 2024-06-14 DIAGNOSIS — F53 Postpartum depression: Secondary | ICD-10-CM

## 2024-06-14 NOTE — BH Specialist Note (Signed)
 [place] Follow-up  MRN: [id number] NAME: Gina Alvarez Date: 06/14/24  Start time: Start Time: 1000 End time: Stop Time: 1030 Total time: Total Time in Minutes (Visit): 30 Call number: Visit Number: Additional Visit  Reason for call today:  Patient is a 34 year old female presenting for a collaborative care follow-up. She presents to session with a relatively positive mood, though she continues to experience ongoing co-parenting stress and relational conflict with her ex-partner. Despite these challenges, the patient reports actively trying to persevere and remain focused on her personal growth.  Patient recently completed her schooling during this period of heightened stress and expresses pride in this accomplishment. She reports overall improvement in mood, though she continues to experience fluctuations, including periods of irritability, emotional withdrawal, and decreased interest in daily activities. Patient continues to endorse symptoms of depression and anxiety but notes these are more manageable than in the past.  Patient reports improved coping and increased insight, working to remain proactive and focusing on her role within the co-parenting conflict rather than factors outside of her control. She acknowledges the situation remains difficult to navigate. Patient has three children and reports ongoing difficulty securing reliable childcare, which has required increased involvement from her ex-partner. This has contributed to relational tension and ongoing stress.  Interventions during the session included supportive counseling, validation of emotional experiences, and reinforcement of adaptive coping strategies. Continued coordination with medication management remains in place to support mood stability.  Plan: Continue collaborative care follow-up with ongoing supportive counseling and medication management to assist the patient in maintaining progress and navigating ongoing  stressors.    PHQ-9 Scores:     06/14/2024   10:04 AM 05/03/2024   10:17 AM 04/05/2024   11:33 AM 03/22/2024    1:12 PM 03/01/2024   10:06 AM  Depression screen PHQ 2/9  Decreased Interest 1 1 3 1  0  Down, Depressed, Hopeless 0 0 2 0 0  PHQ - 2 Score 1 1 5 1  0  Altered sleeping 1 3 0 0 1  Tired, decreased energy 1 3 2  0 0  Change in appetite 0 3 2 3 2   Feeling bad or failure about yourself  1 0 1 0 0  Trouble concentrating 1 0 0 0 0  Moving slowly or fidgety/restless 1 0 0 0 0  Suicidal thoughts 0 0 0 0 0  PHQ-9 Score 6 10 10 4  3    Difficult doing work/chores Somewhat difficult Somewhat difficult Somewhat difficult Somewhat difficult Somewhat difficult     Data saved with a previous flowsheet row definition   GAD-7 Scores:     06/14/2024   10:12 AM 05/03/2024   10:18 AM 04/05/2024   11:33 AM 03/22/2024    1:13 PM  GAD 7 : Generalized Anxiety Score  Nervous, Anxious, on Edge 1 1  1   0   Control/stop worrying 1 0  1  0   Worry too much - different things 2 0  1  0   Trouble relaxing 2 3  2   0   Restless 1 3  2   0   Easily annoyed or irritable 2 3  2  3    Afraid - awful might happen 1 0  0  0   Total GAD 7 Score 10 10 9 3   Anxiety Difficulty Somewhat difficult Somewhat difficult Somewhat difficult Not difficult at all     Data saved with a previous flowsheet row definition    Stress Current stressors:  co-parenting  Sleep:  good Appetite:  good Coping ability:  fair Patient taking medications as prescribed:  Yes  Current medications:  Outpatient Encounter Medications as of 06/14/2024  Medication Sig   acetaminophen  (TYLENOL ) 325 MG tablet Take 2 tablets (650 mg total) by mouth every 6 (six) hours as needed for mild pain (pain score 1-3).   desvenlafaxine  (PRISTIQ ) 100 MG 24 hr tablet Take 1 tablet (100 mg total) by mouth daily. Dose change   hydrOXYzine  (ATARAX ) 25 MG tablet Take 0.5-1 tablets (12.5-25 mg total) by mouth 3 (three) times daily as needed for anxiety.    ibuprofen  (ADVIL ) 600 MG tablet Take 1 tablet (600 mg total) by mouth every 6 (six) hours as needed.   levocetirizine (XYZAL ) 5 MG tablet TAKE ONE TABLET BY MOUTH EVERY DAY   ondansetron  (ZOFRAN ) 4 MG tablet Take 1 tablet (4 mg total) by mouth every 8 (eight) hours as needed for nausea or vomiting.   No facility-administered encounter medications on file as of 06/14/2024.     Self-harm Behaviors Risk Assessment Self-harm risk factors:  past thoughts Patient endorses recent thoughts of harming self:  Denies   Danger to Others Risk Assessment Danger to others risk factors:  None Patient endorses recent thoughts of harming others:  Denies   Substance Use Assessment Patient recently consumed alcohol:  None  Alcohol Use Disorder Identification Test (AUDIT):     11/12/2022    3:07 PM 12/10/2022    9:04 AM 03/17/2023   10:16 AM 01/18/2024   10:59 AM  Alcohol Use Disorder Test (AUDIT)  1. How often do you have a drink containing alcohol? 1  0 0 1   2. How many drinks containing alcohol do you have on a typical day when you are drinking? 0  0 0 0   3. How often do you have six or more drinks on one occasion? 0  0 0 0   AUDIT-C Score 1 0 0 1      Manually entered by patient    Goals, Interventions and Follow-up Plan Goals: Increase healthy adjustment to current life circumstances Interventions: Behavioral Activation, CBT Cognitive Behavioral Therapy, and Medication Monitoring Follow-up Plan: 2 week follow up    [name]

## 2024-06-24 ENCOUNTER — Telehealth: Payer: Self-pay | Admitting: *Deleted

## 2024-06-24 DIAGNOSIS — F418 Other specified anxiety disorders: Secondary | ICD-10-CM

## 2024-06-24 NOTE — Progress Notes (Unsigned)
 Complex Care Management Note Care Guide Note  06/24/2024 Name: Gina Alvarez MRN: [id number] DOB: [birth year]   Complex Care Management Outreach Attempts: An unsuccessful telephone outreach was attempted today to offer the patient information about available complex care management services.  Follow Up Plan:  Additional outreach attempts will be made to offer the patient complex care management information and services.   Encounter Outcome:  No Answer  Harlene Satterfield  [facility]  [facility], [facility] Guide  Direct Dial: [phone]  Fax [phone]

## 2024-06-27 ENCOUNTER — Telehealth: Payer: Self-pay | Admitting: Family Medicine

## 2024-06-27 NOTE — Telephone Encounter (Signed)
 Attempted to call patient to inform her that the office would be opening at 10 am on 06/28/24   The number listed was not in service   Appointment moved from 9:30 am to 10 am

## 2024-06-28 ENCOUNTER — Ambulatory Visit: Admitting: Professional Counselor

## 2024-06-29 NOTE — Progress Notes (Signed)
 Complex Care Management Note  Care Guide Note 06/29/2024 Name: Gina Alvarez MRN: [id number] DOB: December 16, 1990  Gina Alvarez is a 34 y.o. year old female who sees Jolinda Norene HERO, DO for primary care. I reached out to Gina Alvarez by phone today to offer complex care management services.  [name] was given information about Complex Care Management services today including:   The Complex Care Management services include support from the care team which includes your Nurse Care Manager, Clinical Social Worker, or Pharmacist.  The Complex Care Management team is here to help remove barriers to the health concerns and goals most important to you. Complex Care Management services are voluntary, and the patient may decline or stop services at any time by request to their care team member.   Complex Care Management Consent Status: Patient agreed to services and verbal consent obtained.   Follow up plan:  Telephone appointment with complex care management team member scheduled for:  07/15/24  Encounter Outcome:  Patient Scheduled  Harlene Satterfield  [facility]  [facility], [facility] Guide  Direct Dial: [phone]  Fax [phone]

## 2024-07-12 ENCOUNTER — Ambulatory Visit: Admitting: Plastic Surgery

## 2024-07-12 ENCOUNTER — Ambulatory Visit: Admitting: Professional Counselor

## 2024-07-15 ENCOUNTER — Telehealth

## 2024-09-21 ENCOUNTER — Encounter: Admitting: Family Medicine

## 2025-02-20 ENCOUNTER — Encounter: Payer: Self-pay | Admitting: Family Medicine
